# Patient Record
Sex: Female | Born: 1965
Health system: Southern US, Community
[De-identification: ages and names within clinical notes are randomized; demographics above are authoritative.]

## PROBLEM LIST (undated history)

## (undated) DIAGNOSIS — T7840XA Allergy, unspecified, initial encounter: Secondary | ICD-10-CM

## (undated) DIAGNOSIS — R112 Nausea with vomiting, unspecified: Secondary | ICD-10-CM

## (undated) DIAGNOSIS — I10 Essential (primary) hypertension: Secondary | ICD-10-CM

## (undated) DIAGNOSIS — G43909 Migraine, unspecified, not intractable, without status migrainosus: Secondary | ICD-10-CM

## (undated) DIAGNOSIS — Z973 Presence of spectacles and contact lenses: Secondary | ICD-10-CM

## (undated) DIAGNOSIS — E213 Hyperparathyroidism, unspecified: Secondary | ICD-10-CM

## (undated) DIAGNOSIS — F419 Anxiety disorder, unspecified: Secondary | ICD-10-CM

## (undated) DIAGNOSIS — Z87442 Personal history of urinary calculi: Secondary | ICD-10-CM

## (undated) DIAGNOSIS — T4145XA Adverse effect of unspecified anesthetic, initial encounter: Secondary | ICD-10-CM

## (undated) DIAGNOSIS — F32A Depression, unspecified: Secondary | ICD-10-CM

## (undated) DIAGNOSIS — T8859XA Other complications of anesthesia, initial encounter: Secondary | ICD-10-CM

## (undated) DIAGNOSIS — Z9889 Other specified postprocedural states: Secondary | ICD-10-CM

## (undated) HISTORY — DX: Depression, unspecified: F32.A

## (undated) HISTORY — PX: BREAST SURGERY: SHX581

## (undated) HISTORY — PX: OTHER SURGICAL HISTORY: SHX169

## (undated) HISTORY — DX: Allergy, unspecified, initial encounter: T78.40XA

## (undated) HISTORY — DX: Migraine, unspecified, not intractable, without status migrainosus: G43.909

## (undated) HISTORY — PX: NASAL SINUS SURGERY: SHX719

## (undated) HISTORY — DX: Anxiety disorder, unspecified: F41.9

## (undated) HISTORY — DX: Hyperparathyroidism, unspecified: E21.3

## (undated) HISTORY — DX: Essential (primary) hypertension: I10

## (undated) HISTORY — DX: Personal history of urinary calculi: Z87.442

## (undated) HISTORY — PX: PARATHYROIDECTOMY: SHX19

---

## 1999-11-07 ENCOUNTER — Encounter: Payer: Self-pay | Admitting: Emergency Medicine

## 1999-11-07 ENCOUNTER — Encounter: Admission: RE | Admit: 1999-11-07 | Discharge: 1999-11-07 | Payer: Self-pay | Admitting: Emergency Medicine

## 2000-10-01 ENCOUNTER — Encounter: Admission: RE | Admit: 2000-10-01 | Discharge: 2000-10-01 | Payer: Self-pay | Admitting: Emergency Medicine

## 2000-10-01 ENCOUNTER — Encounter: Payer: Self-pay | Admitting: Emergency Medicine

## 2001-03-14 ENCOUNTER — Emergency Department (HOSPITAL_COMMUNITY): Admission: EM | Admit: 2001-03-14 | Discharge: 2001-03-15 | Payer: Self-pay

## 2001-12-05 ENCOUNTER — Encounter: Payer: Self-pay | Admitting: Emergency Medicine

## 2001-12-05 ENCOUNTER — Encounter: Admission: RE | Admit: 2001-12-05 | Discharge: 2001-12-05 | Payer: Self-pay | Admitting: Emergency Medicine

## 2002-01-16 ENCOUNTER — Encounter: Payer: Self-pay | Admitting: Emergency Medicine

## 2002-01-16 ENCOUNTER — Encounter: Admission: RE | Admit: 2002-01-16 | Discharge: 2002-01-16 | Payer: Self-pay | Admitting: Emergency Medicine

## 2002-11-16 ENCOUNTER — Emergency Department (HOSPITAL_COMMUNITY): Admission: EM | Admit: 2002-11-16 | Discharge: 2002-11-16 | Payer: Self-pay | Admitting: *Deleted

## 2004-05-02 ENCOUNTER — Encounter: Admission: RE | Admit: 2004-05-02 | Discharge: 2004-05-02 | Payer: Self-pay | Admitting: Emergency Medicine

## 2006-04-21 ENCOUNTER — Emergency Department (HOSPITAL_COMMUNITY): Admission: AD | Admit: 2006-04-21 | Discharge: 2006-04-21 | Payer: Self-pay | Admitting: Family Medicine

## 2006-07-09 ENCOUNTER — Encounter: Admission: RE | Admit: 2006-07-09 | Discharge: 2006-07-09 | Payer: Self-pay | Admitting: Emergency Medicine

## 2007-11-23 ENCOUNTER — Emergency Department (HOSPITAL_COMMUNITY): Admission: EM | Admit: 2007-11-23 | Discharge: 2007-11-23 | Payer: Self-pay | Admitting: Family Medicine

## 2008-12-15 ENCOUNTER — Emergency Department (HOSPITAL_COMMUNITY): Admission: EM | Admit: 2008-12-15 | Discharge: 2008-12-16 | Payer: Self-pay | Admitting: Emergency Medicine

## 2009-03-27 ENCOUNTER — Emergency Department (HOSPITAL_COMMUNITY): Admission: EM | Admit: 2009-03-27 | Discharge: 2009-03-27 | Payer: Self-pay | Admitting: Emergency Medicine

## 2009-04-26 ENCOUNTER — Ambulatory Visit (HOSPITAL_BASED_OUTPATIENT_CLINIC_OR_DEPARTMENT_OTHER): Admission: RE | Admit: 2009-04-26 | Discharge: 2009-04-26 | Payer: Self-pay | Admitting: Urology

## 2009-09-16 ENCOUNTER — Other Ambulatory Visit: Admission: RE | Admit: 2009-09-16 | Discharge: 2009-09-16 | Payer: Self-pay | Admitting: *Deleted

## 2010-07-18 LAB — URINE MICROSCOPIC-ADD ON

## 2010-07-18 LAB — URINALYSIS, ROUTINE W REFLEX MICROSCOPIC
Bilirubin Urine: NEGATIVE
Specific Gravity, Urine: 1.026 (ref 1.005–1.030)
pH: 6 (ref 5.0–8.0)

## 2010-07-18 LAB — POCT I-STAT, CHEM 8
Calcium, Ion: 1.36 mmol/L — ABNORMAL HIGH (ref 1.12–1.32)
Glucose, Bld: 100 mg/dL — ABNORMAL HIGH (ref 70–99)
HCT: 42 % (ref 36.0–46.0)
Hemoglobin: 14.3 g/dL (ref 12.0–15.0)
TCO2: 24 mmol/L (ref 0–100)

## 2010-07-21 LAB — URINALYSIS, ROUTINE W REFLEX MICROSCOPIC
Ketones, ur: NEGATIVE mg/dL
Leukocytes, UA: NEGATIVE
Nitrite: NEGATIVE
Protein, ur: NEGATIVE mg/dL
Urobilinogen, UA: 0.2 mg/dL (ref 0.0–1.0)
pH: 7.5 (ref 5.0–8.0)

## 2010-07-21 LAB — POCT I-STAT, CHEM 8
Calcium, Ion: 1.39 mmol/L — ABNORMAL HIGH (ref 1.12–1.32)
Chloride: 106 mEq/L (ref 96–112)
HCT: 41 % (ref 36.0–46.0)
TCO2: 25 mmol/L (ref 0–100)

## 2010-07-21 LAB — CBC
HCT: 41.5 % (ref 36.0–46.0)
Platelets: 228 10*3/uL (ref 150–400)
RDW: 12.6 % (ref 11.5–15.5)

## 2010-07-21 LAB — POCT PREGNANCY, URINE: Preg Test, Ur: NEGATIVE

## 2010-07-21 LAB — DIFFERENTIAL
Basophils Absolute: 0.1 10*3/uL (ref 0.0–0.1)
Eosinophils Relative: 2 % (ref 0–5)
Lymphocytes Relative: 21 % (ref 12–46)
Neutro Abs: 5 10*3/uL (ref 1.7–7.7)

## 2012-08-13 ENCOUNTER — Encounter: Payer: Self-pay | Admitting: Family Medicine

## 2012-08-13 DIAGNOSIS — G43909 Migraine, unspecified, not intractable, without status migrainosus: Secondary | ICD-10-CM | POA: Insufficient documentation

## 2012-08-13 DIAGNOSIS — Z87442 Personal history of urinary calculi: Secondary | ICD-10-CM | POA: Insufficient documentation

## 2012-08-13 DIAGNOSIS — I10 Essential (primary) hypertension: Secondary | ICD-10-CM | POA: Insufficient documentation

## 2012-08-13 DIAGNOSIS — T7840XA Allergy, unspecified, initial encounter: Secondary | ICD-10-CM | POA: Insufficient documentation

## 2012-08-14 ENCOUNTER — Ambulatory Visit (INDEPENDENT_AMBULATORY_CARE_PROVIDER_SITE_OTHER): Payer: 59 | Admitting: Physician Assistant

## 2012-08-14 ENCOUNTER — Encounter: Payer: Self-pay | Admitting: Physician Assistant

## 2012-08-14 VITALS — BP 138/94 | HR 64 | Temp 98.0°F | Resp 18 | Ht 68.5 in | Wt 163.0 lb

## 2012-08-14 DIAGNOSIS — G43909 Migraine, unspecified, not intractable, without status migrainosus: Secondary | ICD-10-CM

## 2012-08-14 DIAGNOSIS — M549 Dorsalgia, unspecified: Secondary | ICD-10-CM

## 2012-08-14 MED ORDER — METAXALONE 800 MG PO TABS: 800.0000 mg | ORAL_TABLET | Freq: Four times a day (QID) | ORAL | Status: DC

## 2012-08-14 MED ORDER — BUTALBITAL-APAP-CAFFEINE 50-325-40 MG PO TABS: 1.0000 | ORAL_TABLET | Freq: Two times a day (BID) | ORAL | Status: DC | PRN

## 2012-08-14 NOTE — Progress Notes (Signed)
   Patient ID: Jennifer Castaneda MRN: [id number], DOB: 1965-12-11, 47 y.o. Date of Encounter: 08/14/2012, 12:19 PM    Chief Complaint:  Chief Complaint  Patient presents with  . Back Pain    mid-low back pain and spasms x 2 weeks     HPI: 47 y.o. year old female reports that she has no prior h/o back pain. 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 Comes home in morning-feels ok. Goes to sleep-after several hours, is woken with pain in mid back bilaterally. After she wakes up, and moves around, feels no back pain.  Did no unusual activit, lifting, etc at time of onset. NO over head or upper extremity lifting etc.  Used to go to gym and work out but has not in past few months b/c of work schedule. Doing no push ups etc.   Also, reports that "Dr. Tanya Nones gave me oxycodone to use for migraines but said if that didn't work then would use fioricet." Says oxy makes her "out of it all day", "Just doesn't work good for her.' Wants to use fioricet.      Home Meds: Current Outpatient Prescriptions on File Prior to Visit  Medication Sig Dispense Refill  . hydrochlorothiazide (HYDRODIURIL) 25 MG tablet Take 25 mg by mouth daily.      . Multiple Vitamin (MULTIVITAMIN) tablet Take 1 tablet by mouth daily.      . fexofenadine (ALLEGRA) 180 MG tablet Take 180 mg by mouth daily.       No current facility-administered medications on file prior to visit.    Allergies:  Allergies  Allergen Reactions  . Asa (Aspirin) Hives  . Penicillins Hives      Review of Systems: No pain, numbness, tingling down arms or legs. Other ros negative.   Physical Exam: Blood pressure 138/94, pulse 64, temperature 98 F (36.7 C), temperature source Oral, resp. rate 18, height 5' 8.5" (1.74 m), weight 163 lb (73.936 kg), last menstrual period 04/16/2012., Body mass index is 24.42 kg/(m^2). General: Well developed, well nourished, in no acute distress. Neck: Supple. No thyromegaly. Full ROM. No  lymphadenopathy. Lungs: Clear bilaterally to auscultation without wheezes, rales, or rhonchi. Breathing is unlabored. Heart: Regular rhythm. No murmurs, rubs, or gallops. Msk:  Strength and tone normal for age. Back: She is tender with palpation of medial-inferior edges of scapula bilaterally. She is tender with palpation bilaterally from there down to about T8 level. Extremities/Skin: Warm and dry. No clubbing or cyanosis. No edema. No rashes or suspicious lesions. Neuro: Alert and oriented X 3. Moves all extremities spontaneously. Gait is normal. CNII-XII grossly in tact. Psych:  Responds to questions appropriately with a normal affect.     ASSESSMENT AND PLAN:  47 y.o. year old female with  1. Back pain Heat, stretch, otc nsaids with food. And skelaxin-cautioned regarding drowsiness.  - metaxalone (SKELAXIN) 800 MG tablet; Take 1 tablet (800 mg total) by mouth 4 (four) times daily.  Dispense: 60 tablet; Refill: 0  2. Migraine, unspecified, without mention of intractable migraine without mention of status migrainosus - butalbital-acetaminophen-caffeine (FIORICET, ESGIC) 50-325-40 MG per tablet; Take 1 tablet by mouth 2 (two) times daily as needed for headache.  Dispense: 14 tablet; Refill: 2   Signed, [address] [place], [place], [facility] 08/14/2012 12:19 PM

## 2012-09-03 ENCOUNTER — Telehealth: Payer: Self-pay | Admitting: Physician Assistant

## 2012-09-03 MED ORDER — CYCLOBENZAPRINE HCL 10 MG PO TABS: 10.0000 mg | ORAL_TABLET | Freq: Three times a day (TID) | ORAL | Status: DC | PRN

## 2012-09-03 NOTE — Telephone Encounter (Signed)
Try: Flexeril 10 mg one po QID prn # 60 / 0 Sen in Rx please

## 2012-09-03 NOTE — Telephone Encounter (Signed)
Med called to pharmacy.  Pt aware.

## 2012-09-09 ENCOUNTER — Ambulatory Visit (INDEPENDENT_AMBULATORY_CARE_PROVIDER_SITE_OTHER): Payer: 59 | Admitting: Family Medicine

## 2012-09-09 ENCOUNTER — Encounter: Payer: Self-pay | Admitting: Family Medicine

## 2012-09-09 VITALS — BP 126/74 | HR 80 | Temp 98.6°F | Resp 16 | Wt 160.0 lb

## 2012-09-09 DIAGNOSIS — M545 Low back pain: Secondary | ICD-10-CM

## 2012-09-09 MED ORDER — PREDNISONE 20 MG PO TABS: ORAL_TABLET | ORAL | Status: DC

## 2012-09-09 MED ORDER — DIAZEPAM 10 MG PO TABS: 10.0000 mg | ORAL_TABLET | Freq: Two times a day (BID) | ORAL | Status: DC | PRN

## 2012-09-09 NOTE — Progress Notes (Signed)
Subjective:    Patient ID: Jennifer Castaneda, female    DOB: 1965-12-10, 47 y.o.   MRN: [id number]  HPI  Patient was seen May 1 and diagnosed with mid back pain likely muscle strain. She was given muscle relaxers and tincture of time. She states that her back pain is no better. It is located on the left paraspinal muscles in the thoracic spine just medial to her scapula and slightly inferior to the scapula. It is better throughout the day she is up moving around. He gets worse at night when she lies down. She denies any numbness or tingling in the legs patient denies any weakness in the legs. She denies any other neuropathic symptoms. The pain feels like a tightness or a spasm. She got sick on the Skelaxin. He does respond slightly to Flexeril Past Medical History  Diagnosis Date  . Hypertension   . Migraine headache   . History of nephrolithiasis   . Allergy    Current Outpatient Prescriptions on File Prior to Visit  Medication Sig Dispense Refill  . butalbital-acetaminophen-caffeine (FIORICET, ESGIC) 50-325-40 MG per tablet Take 1 tablet by mouth 2 (two) times daily as needed for headache.  14 tablet  2  . cyclobenzaprine (FLEXERIL) 10 MG tablet Take 1 tablet (10 mg total) by mouth 3 (three) times daily as needed for muscle spasms.  60 tablet  0  . fexofenadine (ALLEGRA) 180 MG tablet Take 180 mg by mouth daily.      . hydrochlorothiazide (HYDRODIURIL) 25 MG tablet Take 25 mg by mouth daily.      . Multiple Vitamin (MULTIVITAMIN) tablet Take 1 tablet by mouth daily.       No current facility-administered medications on file prior to visit.   Allergies  Allergen Reactions  . Asa (Aspirin) Hives  . Penicillins Hives  . Skelaxin (Metaxalone) Nausea And Vomiting   History   Social History  . Marital Status: Married    Spouse Name: N/A    Number of Children: N/A  . Years of Education: N/A   Occupational History  . Not on file.   Social History Main Topics  . Smoking  status: Former Smoker    Quit date: 08/14/2004  . Smokeless tobacco: Never Used  . Alcohol Use: No  . Drug Use: No  . Sexually Active: Not on file   Other Topics Concern  . Not on file   Social History Narrative  . No narrative on file     Review of Systems  All other systems reviewed and are negative.       Objective:   Physical Exam  Vitals reviewed. Cardiovascular: Normal rate, regular rhythm and normal heart sounds.   Pulmonary/Chest: Effort normal and breath sounds normal. No respiratory distress. She has no wheezes. She has no rales.  Musculoskeletal:       Thoracic back: She exhibits tenderness and spasm. She exhibits normal range of motion and no bony tenderness.          Assessment & Plan:  1. Low back pain Is likely muscle spasm. Prescribed prednisone dose pack. Also give patient Valium 10 mg by mouth every 12 hours when necessary muscle spasm. Obtain an x-ray of the thoracic spine rule out skeletal pathology. If the x-ray is negative and the medications and time did not help, I would recommend physical therapy. - DG Thoracic Spine W/Swimmers; Future - predniSONE (DELTASONE) 20 MG tablet; 3 tabs poqday 1-2, 2 tabs poqday 3-4, 1 tabs poqday  Dispense:  12 tablet; Refill: 0 - diazepam (VALIUM) 10 MG tablet; Take 1 tablet (10 mg total) by mouth every 12 (twelve) hours as needed for anxiety.  Dispense: 30 tablet; Refill: 1

## 2012-09-15 ENCOUNTER — Ambulatory Visit
Admission: RE | Admit: 2012-09-15 | Discharge: 2012-09-15 | Disposition: A | Discharge status: Home / Self Care | Payer: 59 | Source: Ambulatory Visit | Attending: Family Medicine | Admitting: Family Medicine

## 2012-09-15 DIAGNOSIS — M545 Low back pain: Secondary | ICD-10-CM

## 2012-09-16 ENCOUNTER — Other Ambulatory Visit: Payer: Self-pay | Admitting: Family Medicine

## 2012-09-16 DIAGNOSIS — M549 Dorsalgia, unspecified: Secondary | ICD-10-CM

## 2012-09-30 ENCOUNTER — Encounter: Payer: Self-pay | Admitting: Family Medicine

## 2012-09-30 ENCOUNTER — Ambulatory Visit (INDEPENDENT_AMBULATORY_CARE_PROVIDER_SITE_OTHER): Payer: 59 | Admitting: Family Medicine

## 2012-09-30 VITALS — BP 110/78 | HR 76 | Temp 98.6°F | Resp 16 | Wt 160.0 lb

## 2012-09-30 DIAGNOSIS — L0390 Cellulitis, unspecified: Secondary | ICD-10-CM

## 2012-09-30 DIAGNOSIS — G43909 Migraine, unspecified, not intractable, without status migrainosus: Secondary | ICD-10-CM

## 2012-09-30 MED ORDER — BUTALBITAL-APAP-CAFFEINE 50-325-40 MG PO TABS: 1.0000 | ORAL_TABLET | Freq: Two times a day (BID) | ORAL | Status: DC | PRN

## 2012-09-30 MED ORDER — SULFAMETHOXAZOLE-TRIMETHOPRIM 800-160 MG PO TABS: 1.0000 | ORAL_TABLET | Freq: Two times a day (BID) | ORAL | Status: DC

## 2012-09-30 NOTE — Progress Notes (Signed)
  Subjective:    Patient ID: Jennifer Castaneda, female    DOB: 1965-10-11, 47 y.o.   MRN: [id number]  HPI Patient has had a tender abscess under her right axilla for 3 days. She has a history of MRSA in the past. The did bump is erythematous tender and fluctuant. It has come to a head in the center. She been trying warm compresses. Past Medical History  Diagnosis Date  . Hypertension   . Migraine headache   . History of nephrolithiasis   . Allergy    Current Outpatient Prescriptions on File Prior to Visit  Medication Sig Dispense Refill  . cyclobenzaprine (FLEXERIL) 10 MG tablet Take 1 tablet (10 mg total) by mouth 3 (three) times daily as needed for muscle spasms.  60 tablet  0  . diazepam (VALIUM) 10 MG tablet Take 1 tablet (10 mg total) by mouth every 12 (twelve) hours as needed for anxiety.  30 tablet  1  . fexofenadine (ALLEGRA) 180 MG tablet Take 180 mg by mouth daily.      . hydrochlorothiazide (HYDRODIURIL) 25 MG tablet Take 25 mg by mouth daily.      . Multiple Vitamin (MULTIVITAMIN) tablet Take 1 tablet by mouth daily.      . predniSONE (DELTASONE) 20 MG tablet 3 tabs poqday 1-2, 2 tabs poqday 3-4, 1 tabs poqday  12 tablet  0   No current facility-administered medications on file prior to visit.   Allergies  Allergen Reactions  . Asa (Aspirin) Hives  . Penicillins Hives  . Skelaxin (Metaxalone) Nausea And Vomiting   History   Social History  . Marital Status: Married    Spouse Name: N/A    Number of Children: N/A  . Years of Education: N/A   Occupational History  . Not on file.   Social History Main Topics  . Smoking status: Former Smoker    Quit date: 08/14/2004  . Smokeless tobacco: Never Used  . Alcohol Use: No  . Drug Use: No  . Sexually Active: Not on file   Other Topics Concern  . Not on file   Social History Narrative  . No narrative on file      Review of Systems  All other systems reviewed and are negative.       Objective:   Physical Exam  Vitals reviewed. Cardiovascular: Normal rate and regular rhythm.   Pulmonary/Chest: Effort normal and breath sounds normal.  Skin: There is erythema.   1 cm superficial erythematous boil in the right axilla that has come to a pustule in the center.        Assessment & Plan:  1. Cellulitis and abscess I feel we may avoid I+D.  Begin warm compresses TID and start Bactrim.  I+D if worsening or if no better in 2-3 days. - sulfamethoxazole-trimethoprim (BACTRIM DS,SEPTRA DS) 800-160 MG per tablet; Take 1 tablet by mouth 2 (two) times daily.  Dispense: 20 tablet; Refill: 0  2. Migraine, unspecified, without mention of intractable migraine without mention of status migrainosus Patient lost previous prescription and I refilled again. - butalbital-acetaminophen-caffeine (FIORICET, ESGIC) 50-325-40 MG per tablet; Take 1 tablet by mouth 2 (two) times daily as needed for headache.  Dispense: 14 tablet; Refill: 2

## 2012-10-07 ENCOUNTER — Ambulatory Visit: Payer: 59 | Attending: Family Medicine

## 2012-10-07 DIAGNOSIS — M545 Low back pain, unspecified: Secondary | ICD-10-CM | POA: Insufficient documentation

## 2012-10-07 DIAGNOSIS — IMO0001 Reserved for inherently not codable concepts without codable children: Secondary | ICD-10-CM | POA: Insufficient documentation

## 2012-10-07 DIAGNOSIS — R5381 Other malaise: Secondary | ICD-10-CM | POA: Insufficient documentation

## 2012-10-07 DIAGNOSIS — M6281 Muscle weakness (generalized): Secondary | ICD-10-CM | POA: Insufficient documentation

## 2012-10-08 ENCOUNTER — Ambulatory Visit: Payer: 59

## 2012-10-13 ENCOUNTER — Ambulatory Visit: Payer: 59 | Admitting: Physical Therapy

## 2012-10-14 ENCOUNTER — Ambulatory Visit: Payer: 59 | Attending: Family Medicine

## 2012-10-14 DIAGNOSIS — IMO0001 Reserved for inherently not codable concepts without codable children: Secondary | ICD-10-CM | POA: Insufficient documentation

## 2012-10-14 DIAGNOSIS — R5381 Other malaise: Secondary | ICD-10-CM | POA: Insufficient documentation

## 2012-10-14 DIAGNOSIS — M6281 Muscle weakness (generalized): Secondary | ICD-10-CM | POA: Insufficient documentation

## 2013-01-26 ENCOUNTER — Telehealth: Payer: Self-pay | Admitting: Family Medicine

## 2013-01-26 NOTE — Telephone Encounter (Addendum)
Needs her HCTZ and flexeril refilled   [facility]  [address]

## 2013-01-27 MED ORDER — HYDROCHLOROTHIAZIDE 25 MG PO TABS: 25.0000 mg | ORAL_TABLET | Freq: Every day | ORAL | Status: DC

## 2013-01-27 MED ORDER — CYCLOBENZAPRINE HCL 10 MG PO TABS: 10.0000 mg | ORAL_TABLET | Freq: Three times a day (TID) | ORAL | Status: DC | PRN

## 2013-01-27 NOTE — Telephone Encounter (Signed)
?   OK to Refill  

## 2013-01-27 NOTE — Telephone Encounter (Signed)
ok 

## 2013-01-27 NOTE — Telephone Encounter (Signed)
Rx Refilled  

## 2013-09-17 ENCOUNTER — Other Ambulatory Visit: Payer: Self-pay | Admitting: *Deleted

## 2013-09-17 MED ORDER — HYDROCHLOROTHIAZIDE 25 MG PO TABS: 25.0000 mg | ORAL_TABLET | Freq: Every day | ORAL | Status: DC

## 2013-09-17 NOTE — Telephone Encounter (Signed)
Medication filled x1 with no refills.   Requires office visit before any further refills can be given.  

## 2013-11-30 ENCOUNTER — Other Ambulatory Visit: Payer: Self-pay | Admitting: Family Medicine

## 2013-12-04 ENCOUNTER — Encounter: Payer: Self-pay | Admitting: Family Medicine

## 2013-12-04 ENCOUNTER — Ambulatory Visit (INDEPENDENT_AMBULATORY_CARE_PROVIDER_SITE_OTHER): Payer: 59 | Admitting: Family Medicine

## 2013-12-04 VITALS — BP 100/62 | HR 72 | Temp 98.3°F | Resp 16 | Ht 69.0 in | Wt 158.0 lb

## 2013-12-04 DIAGNOSIS — Z79890 Hormone replacement therapy (postmenopausal): Secondary | ICD-10-CM

## 2013-12-04 DIAGNOSIS — M7989 Other specified soft tissue disorders: Secondary | ICD-10-CM

## 2013-12-04 DIAGNOSIS — G43909 Migraine, unspecified, not intractable, without status migrainosus: Secondary | ICD-10-CM

## 2013-12-04 MED ORDER — CONJ ESTROG-MEDROXYPROGEST ACE 0.3-1.5 MG PO TABS: 1.0000 | ORAL_TABLET | Freq: Every day | ORAL | Status: DC

## 2013-12-04 MED ORDER — HYDROCHLOROTHIAZIDE 25 MG PO TABS: 25.0000 mg | ORAL_TABLET | Freq: Every day | ORAL | Status: DC

## 2013-12-04 MED ORDER — BUTALBITAL-APAP-CAFFEINE 50-325-40 MG PO TABS: 1.0000 | ORAL_TABLET | Freq: Two times a day (BID) | ORAL | Status: DC | PRN

## 2013-12-04 NOTE — Progress Notes (Signed)
Subjective:    Patient ID: Jennifer Castaneda, female    DOB: April 04, 1966, 48 y.o.   MRN: [id number]  HPI Patient complaining of hot flashes, vaginal dryness, poor libido, and they've swings. She chooses to going to menopause. She has not had a period in one year. She is interested in medication to assist with these symptoms. She understands that there is an increased risk of stroke, blood clot, and breast cancer on hormone replacement therapy and she is willing to accept the risk. She also complains of occasional episodic migraines. She takes Fioricet 3-4 times a month. She also complains of swelling in her legs. She had +1 pitting edema in both ankles. This occurred after she discontinued her hydrochlorothiazide. Since resuming the medication the swelling is much better. She denies any chest pain shortness of breath or dyspnea on exertion. She denies any orthopnea or paroxysmal nocturnal dyspnea. Past Medical History  Diagnosis Date  . Hypertension   . Migraine headache   . History of nephrolithiasis   . Allergy    Current Outpatient Prescriptions on File Prior to Visit  Medication Sig Dispense Refill  . fexofenadine (ALLEGRA) 180 MG tablet Take 180 mg by mouth daily.      . hydrochlorothiazide (HYDRODIURIL) 25 MG tablet TAKE 1 TABLET BY MOUTH DAILY  30 tablet  0  . Multiple Vitamin (MULTIVITAMIN) tablet Take 1 tablet by mouth daily.       No current facility-administered medications on file prior to visit.   Allergies  Allergen Reactions  . Asa [Aspirin] Hives  . Penicillins Hives  . Skelaxin [Metaxalone] Nausea And Vomiting   History   Social History  . Marital Status: Married    Spouse Name: N/A    Number of Children: N/A  . Years of Education: N/A   Occupational History  . Not on file.   Social History Main Topics  . Smoking status: Former Smoker    Quit date: 08/14/2004  . Smokeless tobacco: Never Used  . Alcohol Use: No  . Drug Use: No  . Sexual Activity: Not  on file   Other Topics Concern  . Not on file   Social History Narrative  . No narrative on file      Review of Systems  All other systems reviewed and are negative.      Objective:   Physical Exam  Vitals reviewed. Constitutional: She appears well-developed and well-nourished. No distress.  Neck: Neck supple. No JVD present. No thyromegaly present.  Cardiovascular: Normal rate, regular rhythm and normal heart sounds.   No murmur heard. Pulmonary/Chest: Effort normal and breath sounds normal. No respiratory distress. She has no wheezes. She has no rales.  Abdominal: Soft. Bowel sounds are normal. She exhibits no distension. There is no tenderness. There is no rebound and no guarding.  Musculoskeletal: She exhibits no edema.  Lymphadenopathy:    She has no cervical adenopathy.  Skin: She is not diaphoretic.          Assessment & Plan:  Migraine, unspecified, without mention of intractable migraine without mention of status migrainosus - Plan: butalbital-acetaminophen-caffeine (FIORICET, ESGIC) 50-325-40 MG per tablet  Hormone replacement therapy - Plan: estrogen, conjugated,-medroxyprogesterone (PREMPRO) 0.3-1.5 MG per tablet, COMPLETE METABOLIC PANEL WITH GFR, CBC with Differential  Swelling of limb   I refilled the patient's Fioricet. I instructed her to use it sparingly. However this prescription and 2 refills her last 6 months. I saw the patient on Prempro to 0.3/1.5 mg one by mouth daily.  I would try to titrate the patient to the lowest effective dose to help control her symptoms. She is to contact me in one month and let me know how she's doing on this dose. Also check a CMP to evaluate her kidney and liver function. However believe the swelling in her legs is due to a combination of varicose veins/chronic venous insufficiency and stopping her diuretic. I recommended resuming hydrochlorothiazide. Also recommended she wear compression stockings particularly when she is  working 12 hour shifts standing the entire duration.

## 2013-12-05 LAB — COMPLETE METABOLIC PANEL WITH GFR
ALK PHOS: 119 U/L — AB (ref 39–117)
ALT: 15 U/L (ref 0–35)
AST: 15 U/L (ref 0–37)
Albumin: 4.6 g/dL (ref 3.5–5.2)
BILIRUBIN TOTAL: 0.6 mg/dL (ref 0.2–1.2)
BUN: 17 mg/dL (ref 6–23)
CO2: 30 mEq/L (ref 19–32)
Calcium: 10.5 mg/dL (ref 8.4–10.5)
Chloride: 101 mEq/L (ref 96–112)
Creat: 0.72 mg/dL (ref 0.50–1.10)
GFR, Est African American: >89 mL/min
GLUCOSE: 99 mg/dL (ref 70–99)
Potassium: 3.9 mEq/L (ref 3.5–5.3)
SODIUM: 140 meq/L (ref 135–145)
TOTAL PROTEIN: 7 g/dL (ref 6.0–8.3)

## 2013-12-05 LAB — CBC WITH DIFFERENTIAL/PLATELET
BASOS PCT: 1 % (ref 0–1)
Basophils Absolute: 0.1 10*3/uL (ref 0.0–0.1)
Eosinophils Absolute: 0.3 10*3/uL (ref 0.0–0.7)
Eosinophils Relative: 6 % — ABNORMAL HIGH (ref 0–5)
HEMATOCRIT: 39.4 % (ref 36.0–46.0)
HEMOGLOBIN: 13.2 g/dL (ref 12.0–15.0)
LYMPHS ABS: 1.9 10*3/uL (ref 0.7–4.0)
Lymphocytes Relative: 36 % (ref 12–46)
MCH: 29.3 pg (ref 26.0–34.0)
MCHC: 33.5 g/dL (ref 30.0–36.0)
MCV: 87.6 fL (ref 78.0–100.0)
MONO ABS: 0.4 10*3/uL (ref 0.1–1.0)
MONOS PCT: 7 % (ref 3–12)
NEUTROS ABS: 2.7 10*3/uL (ref 1.7–7.7)
Neutrophils Relative %: 50 % (ref 43–77)
Platelets: 274 10*3/uL (ref 150–400)
RBC: 4.5 MIL/uL (ref 3.87–5.11)
RDW: 14.1 % (ref 11.5–15.5)
WBC: 5.4 10*3/uL (ref 4.0–10.5)

## 2013-12-07 ENCOUNTER — Encounter: Payer: Self-pay | Admitting: *Deleted

## 2014-08-22 ENCOUNTER — Emergency Department (HOSPITAL_COMMUNITY)
Admission: EM | Admit: 2014-08-22 | Discharge: 2014-08-22 | Disposition: A | Discharge status: Home / Self Care | Payer: 59 | Source: Home / Self Care | Attending: Family Medicine | Admitting: Family Medicine

## 2014-08-22 DIAGNOSIS — J111 Influenza due to unidentified influenza virus with other respiratory manifestations: Secondary | ICD-10-CM | POA: Diagnosis not present

## 2014-08-22 DIAGNOSIS — R69 Illness, unspecified: Principal | ICD-10-CM

## 2014-08-22 LAB — POCT RAPID STREP A: Streptococcus, Group A Screen (Direct): NEGATIVE

## 2014-08-22 NOTE — Discharge Instructions (Signed)
Treat fever as needed, drink plenty of fluids, rest, no work on mon.

## 2014-08-22 NOTE — ED Provider Notes (Signed)
CSN: [id number]     Arrival date & time 08/22/14  1409 History   First MD Initiated Contact with Patient 08/22/14 1445     Chief Complaint  Patient presents with  . Sore Throat   (Consider location/radiation/quality/duration/timing/severity/associated sxs/prior Treatment) Patient is a 49 y.o. female presenting with fever. The history is provided by the patient.  Fever Temp source:  Oral Onset quality:  Sudden Duration:  2 days Progression:  Worsening Chronicity:  New Relieved by:  None tried Worsened by:  Nothing tried Ineffective treatments:  None tried Associated symptoms: chills, congestion, cough, diarrhea, myalgias and sore throat   Associated symptoms: no chest pain, no dysuria, no rash and no vomiting   Risk factors: sick contacts   Risk factors comment:  Pts at work with flu.   Past Medical History  Diagnosis Date  . Hypertension   . Migraine headache   . History of nephrolithiasis   . Allergy    Past Surgical History  Procedure Laterality Date  . Nasal sinus surgery    . Cesarean section    . Breast surgery      cyst   Family History  Problem Relation Age of Onset  . Heart disease Mother   . Hypertension Mother   . Cancer Neg Hx    History  Substance Use Topics  . Smoking status: Former Smoker    Quit date: 08/14/2004  . Smokeless tobacco: Never Used  . Alcohol Use: No   OB History    No data available     Review of Systems  Constitutional: Positive for fever and chills.  HENT: Positive for congestion and sore throat.   Respiratory: Positive for cough. Negative for shortness of breath.   Cardiovascular: Negative for chest pain.  Gastrointestinal: Positive for diarrhea. Negative for vomiting.  Genitourinary: Negative.  Negative for dysuria.  Musculoskeletal: Positive for myalgias.  Skin: Negative for rash.    Allergies  Asa; Penicillins; and Skelaxin  Home Medications   Prior to Admission medications   Medication Sig Start Date End Date  Taking? Authorizing Provider  butalbital-acetaminophen-caffeine (FIORICET, ESGIC) 5305336500 MG per tablet Take 1 tablet by mouth 2 (two) times daily as needed for headache. 12/04/13   Susy Frizzle, MD  estrogen, conjugated,-medroxyprogesterone (PREMPRO) 0.3-1.5 MG per tablet Take 1 tablet by mouth daily. 12/04/13   Susy Frizzle, MD  fexofenadine (ALLEGRA) 180 MG tablet Take 180 mg by mouth daily.    Historical Provider, MD  hydrochlorothiazide (HYDRODIURIL) 25 MG tablet TAKE 1 TABLET BY MOUTH DAILY 11/30/13   Susy Frizzle, MD  hydrochlorothiazide (HYDRODIURIL) 25 MG tablet Take 1 tablet (25 mg total) by mouth daily. 12/04/13   Susy Frizzle, MD  Multiple Vitamin (MULTIVITAMIN) tablet Take 1 tablet by mouth daily.    Historical Provider, MD   BP 144/94 mmHg  Pulse 98  Temp(Src) 99.4 F (37.4 C) (Oral)  Resp 20  SpO2 97%  LMP 04/16/2012 Physical Exam  Constitutional: She is oriented to person, place, and time. She appears well-developed and well-nourished. No distress.  HENT:  Head: Normocephalic.  Right Ear: External ear normal.  Left Ear: External ear normal.  Mouth/Throat: Oropharynx is clear and moist.  Eyes: Conjunctivae are normal. Pupils are equal, round, and reactive to light.  Neck: Normal range of motion. Neck supple.  Cardiovascular: Normal rate, normal heart sounds and intact distal pulses.   Pulmonary/Chest: Effort normal and breath sounds normal.  Lymphadenopathy:    She has cervical adenopathy.  Neurological: She is alert and oriented to person, place, and time.  Skin: Skin is warm and dry.  Nursing note and vitals reviewed.   ED Course  Procedures (including critical care time) Labs Review Labs Reviewed  POCT RAPID STREP A ([facility] ONLY)   Strep neg. Imaging Review No results found.   MDM   1. Influenza-like illness        Billy Fischer, MD 08/22/14 1536

## 2014-08-22 NOTE — ED Notes (Signed)
Sore throat, cough, low grade temp x 3 days

## 2014-08-23 ENCOUNTER — Ambulatory Visit (INDEPENDENT_AMBULATORY_CARE_PROVIDER_SITE_OTHER): Payer: 59 | Admitting: Physician Assistant

## 2014-08-23 ENCOUNTER — Encounter: Payer: Self-pay | Admitting: Physician Assistant

## 2014-08-23 ENCOUNTER — Telehealth: Payer: Self-pay | Admitting: Physician Assistant

## 2014-08-23 ENCOUNTER — Encounter: Payer: Self-pay | Admitting: Family Medicine

## 2014-08-23 VITALS — BP 132/84 | HR 84 | Temp 98.4°F | Resp 20 | Wt 162.0 lb

## 2014-08-23 DIAGNOSIS — R509 Fever, unspecified: Secondary | ICD-10-CM | POA: Diagnosis not present

## 2014-08-23 DIAGNOSIS — J988 Other specified respiratory disorders: Secondary | ICD-10-CM | POA: Diagnosis not present

## 2014-08-23 DIAGNOSIS — B9689 Other specified bacterial agents as the cause of diseases classified elsewhere: Principal | ICD-10-CM

## 2014-08-23 LAB — INFLUENZA A AND B
INFLUENZA A AG: NEGATIVE
INFLUENZA B AG: NEGATIVE

## 2014-08-23 MED ORDER — AZITHROMYCIN 250 MG PO TABS: ORAL_TABLET | ORAL | Status: DC

## 2014-08-23 NOTE — Telephone Encounter (Signed)
Patient called in states she went to [facility] yesterday was dx with the flu. She has requested FMLA from her employer "[employer]". She states that they took her out through tonight but without FMLA paperwork it is going to give her 3 additional points which patient will be over the allowable amount. I advised her that she would need to contact [employer] and request the paperwork for FMLA. She wants to know if she needs to be seen here today. I advised patient I would take message and have provider look at the note from yesterday but felt that if she was dx with flu yesterday she didn't need to come here.  Please advise

## 2014-08-23 NOTE — Progress Notes (Signed)
Patient ID: Jennifer Castaneda MRN: [id number], DOB: 1965-05-11, 49 y.o. Date of Encounter: 08/23/2014, 1:50 PM    Chief Complaint:  Chief Complaint  Patient presents with  . sick x 5 days    cough, chest burns, diarrhea, fever (101.5), seen urgent care yesterday they gave her nothing     HPI: 49 y.o. year old female states that she works as a Marine scientist at labor and delivery.  Says that symptoms started with a tickle in her throat then stuffiness in her head and nose and now is into her chest. Says now she is coughing up thick dark phlegm. Says she went to an urgent care yesterday but they gave her no medication so she is here for follow-up. Says the symptoms are worsening and moving into her chest and in the past when this is happened at his cause bronchitis.     Home Meds:   Outpatient Prescriptions Prior to Visit  Medication Sig Dispense Refill  . butalbital-acetaminophen-caffeine (FIORICET, ESGIC) 50-325-40 MG per tablet Take 1 tablet by mouth 2 (two) times daily as needed for headache. 14 tablet 2  . fexofenadine (ALLEGRA) 180 MG tablet Take 180 mg by mouth daily.    . hydrochlorothiazide (HYDRODIURIL) 25 MG tablet TAKE 1 TABLET BY MOUTH DAILY 30 tablet 0  . Multiple Vitamin (MULTIVITAMIN) tablet Take 1 tablet by mouth daily.    Marland Kitchen estrogen, conjugated,-medroxyprogesterone (PREMPRO) 0.3-1.5 MG per tablet Take 1 tablet by mouth daily. (Patient not taking: Reported on 08/23/2014) 30 tablet 2  . hydrochlorothiazide (HYDRODIURIL) 25 MG tablet Take 1 tablet (25 mg total) by mouth daily. 90 tablet 3   No facility-administered medications prior to visit.    Allergies:  Allergies  Allergen Reactions  . Asa [Aspirin] Hives  . Penicillins Hives  . Skelaxin [Metaxalone] Nausea And Vomiting      Review of Systems: See HPI for pertinent ROS. All other ROS negative.    Physical Exam: Blood pressure 132/84, pulse 84, temperature 98.4 F (36.9 C), temperature source Oral, resp.  rate 20, weight 162 lb (73.483 kg), last menstrual period 04/16/2012., Body mass index is 23.91 kg/(m^2). General:  WNWD Female. Appears in no acute distress. HEENT: Normocephalic, atraumatic, eyes without discharge, sclera non-icteric, nares are without discharge. Bilateral auditory canals clear, TM's are without perforation, pearly grey and translucent with reflective cone of light bilaterally. Oral cavity moist, posterior pharynx without exudate, erythema, peritonsillar abscess.  Neck: Supple. No thyromegaly. No lymphadenopathy. Lungs: Clear bilaterally to auscultation without wheezes, rales, or rhonchi. Breathing is unlabored. Heart: Regular rhythm. No murmurs, rubs, or gallops. Msk:  Strength and tone normal for age. Extremities/Skin: Warm and dry. Neuro: Alert and oriented X 3. Moves all extremities spontaneously. Gait is normal. CNII-XII grossly in tact. Psych:  Responds to questions appropriately with a normal affect.   Results for orders placed or performed in visit on 08/23/14  Influenza a and b  Result Value Ref Range   Source-INFBD NASAL    Inflenza A Ag NEG Negative   Influenza B Ag NEG Negative     ASSESSMENT AND PLAN:  49 y.o. year old female with  1. Bacterial respiratory infection  Urgent care records were reviewed. Rapid stress test was performed there and was negative. Influenza test today also negative. - azithromycin (ZITHROMAX) 250 MG tablet; Day 1: Take 2 daily.  Days 2-5: Take 1 daily.  Dispense: 6 tablet; Refill: 0 Is to take antibiotic as directed. Recommend expectorant such as Mucinex DM. Follow-up  if symptoms do not resolve within 1 week after completion of antibiotic. Letter given for out of work through 08/28/14 secondary to the way her work schedule is.  2. Fever and chills - Influenza a and b   Signed, [address] [place], [place], [facility] 08/23/2014 1:50 PM

## 2014-08-23 NOTE — Telephone Encounter (Signed)
Spoke with [place] and since patient wasn't dx with flu she would need to be seen. I called patient Dr. Dennard Schaumann didn't have anything for today so I scheduled her with [facility].

## 2014-08-24 ENCOUNTER — Telehealth: Payer: Self-pay | Admitting: Physician Assistant

## 2014-08-24 LAB — CULTURE, GROUP A STREP: Strep A Culture: NEGATIVE

## 2014-08-24 NOTE — Telephone Encounter (Signed)
fmla forms received by fax on 08/24/2014 Routed to sabrina and shannon

## 2014-08-25 NOTE — Telephone Encounter (Signed)
[facility]trc to pt need to know what FMLA papers are for and hrs work and job duties

## 2014-08-26 NOTE — Telephone Encounter (Signed)
FMLA forms completed and given to [place].

## 2014-08-26 NOTE — Telephone Encounter (Signed)
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

## 2014-08-27 NOTE — Telephone Encounter (Signed)
More FMLA ppw was faxed over for this pt. I have routed them to [place]

## 2014-08-30 NOTE — Telephone Encounter (Signed)
ppw placed on my desk and pt aware needs to pay the form fee and I will fax over FMLA  Routed the call to [facility] and pt paid the fee

## 2014-09-03 NOTE — Telephone Encounter (Signed)
Payment has been posted.

## 2014-09-21 ENCOUNTER — Encounter: Payer: Self-pay | Admitting: Family Medicine

## 2014-09-21 ENCOUNTER — Ambulatory Visit
Admission: RE | Admit: 2014-09-21 | Discharge: 2014-09-21 | Disposition: A | Discharge status: Home / Self Care | Payer: 59 | Source: Ambulatory Visit | Attending: Family Medicine | Admitting: Family Medicine

## 2014-09-21 ENCOUNTER — Ambulatory Visit (INDEPENDENT_AMBULATORY_CARE_PROVIDER_SITE_OTHER): Payer: 59 | Admitting: Family Medicine

## 2014-09-21 VITALS — BP 128/74 | HR 68 | Temp 98.7°F | Resp 14 | Ht 69.0 in | Wt 159.0 lb

## 2014-09-21 DIAGNOSIS — R109 Unspecified abdominal pain: Secondary | ICD-10-CM | POA: Diagnosis not present

## 2014-09-21 DIAGNOSIS — N390 Urinary tract infection, site not specified: Secondary | ICD-10-CM

## 2014-09-21 LAB — URINALYSIS, ROUTINE W REFLEX MICROSCOPIC
Glucose, UA: NEGATIVE mg/dL
KETONES UR: NEGATIVE mg/dL
Nitrite: NEGATIVE
Protein, ur: NEGATIVE mg/dL
Specific Gravity, Urine: 1.025 (ref 1.005–1.030)
Urobilinogen, UA: 0.2 mg/dL (ref 0.0–1.0)
pH: 6 (ref 5.0–8.0)

## 2014-09-21 LAB — CBC W/MCH & 3 PART DIFF
HCT: 38.3 % (ref 36.0–46.0)
HEMOGLOBIN: 13.3 g/dL (ref 12.0–15.0)
Lymphocytes Relative: 29 % (ref 12–46)
Lymphs Abs: 1.6 10*3/uL (ref 0.7–4.0)
MCH: 30.5 pg (ref 26.0–34.0)
MCHC: 34.7 g/dL (ref 30.0–36.0)
MCV: 87.8 fL (ref 78.0–100.0)
NEUTROS ABS: 3.4 10*3/uL (ref 1.7–7.7)
Neutrophils Relative %: 63 % (ref 43–77)
PLATELETS: 172 10*3/uL (ref 150–400)
RBC: 4.36 MIL/uL (ref 3.87–5.11)
RDW: 13 % (ref 11.5–15.5)
WBC mixed population: 0.4 10*3/uL (ref 0.1–1.8)
WBC: 5.4 10*3/uL (ref 4.0–10.5)
WBCMIXPER: 8 % (ref 3–18)

## 2014-09-21 LAB — URINALYSIS, MICROSCOPIC ONLY
CASTS: NONE SEEN
Crystals: NONE SEEN

## 2014-09-21 MED ORDER — PHENAZOPYRIDINE HCL 100 MG PO TABS: 100.0000 mg | ORAL_TABLET | Freq: Three times a day (TID) | ORAL | Status: DC | PRN

## 2014-09-21 MED ORDER — CIPROFLOXACIN HCL 500 MG PO TABS: 500.0000 mg | ORAL_TABLET | Freq: Two times a day (BID) | ORAL | Status: DC

## 2014-09-21 MED ORDER — TAMSULOSIN HCL 0.4 MG PO CAPS: 0.4000 mg | ORAL_CAPSULE | Freq: Every day | ORAL | Status: DC

## 2014-09-21 NOTE — Patient Instructions (Addendum)
Take AZO for bladder spasm Stop Bactrim, start Cipro twice a day  Get xray done Your CBC was normal Urine culture sent  F/U pending results

## 2014-09-21 NOTE — Progress Notes (Signed)
Patient ID: Jennifer Castaneda, female   DOB: 1965-08-05, 49 y.o.   MRN: [id number]   Subjective:    Patient ID: Jennifer Castaneda, female    DOB: 24-Jul-1965, 49 y.o.   MRN: [id number]  Patient presents for Kidney Stones  Pt here with left flank pain started Saturday AM along with dysuria, and urinary frequency. She was was seen at [facility], given Bactrim , indomethacin and norco. No xray done. Continues to have a lot of spasm in her bladder which is causing severe pain, as well as soreness in flank. Denies fever, N/V, denies change in stool, no change bowels. Has not passed a stone.     Review Of Systems:  GEN- denies fatigue, fever, weight loss,weakness, recent illness HEENT- denies eye drainage, change in vision, nasal discharge, CVS- denies chest pain, palpitations RESP- denies SOB, cough, wheeze ABD- denies N/V, change in stools, abd pain GU- +dysuria, hematuria, dribbling, incontinence MSK- denies joint pain,+ muscle aches, injury Neuro- denies headache, dizziness, syncope, seizure activity       Objective:    BP 128/74 mmHg  Pulse 68  Temp(Src) 98.7 F (37.1 C) (Oral)  Resp 14  Ht 5\' 9"  (1.753 m)  Wt 159 lb (72.122 kg)  BMI 23.47 kg/m2  LMP 04/16/2012 GEN- NAD, alert and oriented x3 HEENT- PERRL, EOMI, non injected sclera, pink conjunctiva, MMM, oropharynx clear ABD-NABS,soft,TTP Suprapubic region, LLQ, + left CVA tenderness, no rebound, gaurding at flank  EXT- No edema Pulses- Radial  2+  CBC with diff- wnl       Assessment & Plan:      Problem List Items Addressed This Visit    None    Visit Diagnoses    UTI (lower urinary tract infection)    -  Primary    Change to Cipro, no sign of acute pylonephriotis, based on CBC, no fever, Urine no overwhelming pyuria. Concern for possible kidney stone with hematura.  Xray to be done  Pt has flomax at home if stone seen on xray Pyrdium given for bladder spasm    Relevant Medications     sulfamethoxazole-trimethoprim (BACTRIM DS,SEPTRA DS) 800-160 MG per tablet    phenazopyridine (PYRIDIUM) 100 MG tablet    Other Relevant Orders    Urinalysis, Routine w reflex microscopic (not at [facility]) (Completed)    CBC w/[facility] & 3 Part Diff (Completed)    Urine culture    Left flank pain        Relevant Orders    DG Abd 2 Views       Note: This dictation was prepared with Dragon dictation along with smaller phrase technology. Any transcriptional errors that result from this process are unintentional.

## 2014-09-23 LAB — URINE CULTURE
Colony Count: NO GROWTH
Organism ID, Bacteria: NO GROWTH

## 2014-09-24 ENCOUNTER — Telehealth: Payer: Self-pay | Admitting: Physician Assistant

## 2014-09-24 DIAGNOSIS — R1030 Lower abdominal pain, unspecified: Secondary | ICD-10-CM

## 2014-09-24 DIAGNOSIS — N3289 Other specified disorders of bladder: Secondary | ICD-10-CM

## 2014-09-24 NOTE — Telephone Encounter (Signed)
Spoke with patient earlier.   Reports that she is having bladder spasms, but at that time, no pain discussed.   States that pain that was radiating around back has resolved.   MD to be made aware.

## 2014-09-24 NOTE — Telephone Encounter (Signed)
Patient states returning call and wants you to know that she is still having a lot of bladder pain [phone]

## 2014-09-27 NOTE — Telephone Encounter (Signed)
Patient returned call and made aware.   States that she passed another stone on Saturday night, and symptoms have almost completely resolved.   MD to be made aware.

## 2014-09-27 NOTE — Telephone Encounter (Signed)
noted 

## 2014-09-27 NOTE — Telephone Encounter (Signed)
Orders placed.   [place].

## 2014-09-27 NOTE — Telephone Encounter (Signed)
Have pt get CT abdomen pelvis with oral contrast, to see if there is something in the pelvic region or bowels that we are missing   - Dx- Abdominal pain, flank pain,   - okay to send referral to urology-

## 2014-09-28 ENCOUNTER — Telehealth: Payer: Self-pay | Admitting: *Deleted

## 2014-09-28 NOTE — Telephone Encounter (Signed)
Pt is scheduled for appt on 10/01/14 at 10:15am arrival 9:55am at [facility]. Pt is to have nothing to eat 4hrs proir to her exam, also, pt needs to come and pick up the contrast day before her appt and drink on bottle at 8:15am and the 2nd bottle at 9:15am. Left message to return call to pt.

## 2014-09-29 ENCOUNTER — Telehealth: Payer: Self-pay | Admitting: *Deleted

## 2014-09-29 NOTE — Telephone Encounter (Signed)
Pt has appt scheduled at [facility] urology on 10/25/14 at 11am with Dr. Janice Norrie, left message on pt vm to return my call

## 2014-10-01 ENCOUNTER — Other Ambulatory Visit: Payer: 59

## 2014-10-08 NOTE — Telephone Encounter (Signed)
Pt rtn your call  [phone]

## 2014-10-08 NOTE — Telephone Encounter (Signed)
Pt called back and aware of appt 

## 2014-10-08 NOTE — Telephone Encounter (Signed)
lmtrc

## 2014-12-23 ENCOUNTER — Other Ambulatory Visit: Payer: Self-pay | Admitting: Family Medicine

## 2014-12-23 NOTE — Telephone Encounter (Signed)
Refill appropriate and filled per protocol. 

## 2015-03-25 ENCOUNTER — Other Ambulatory Visit (HOSPITAL_COMMUNITY): Payer: Self-pay | Admitting: Sports Medicine

## 2015-03-25 DIAGNOSIS — M25551 Pain in right hip: Secondary | ICD-10-CM

## 2015-04-14 ENCOUNTER — Other Ambulatory Visit: Payer: Self-pay | Admitting: Family Medicine

## 2015-04-14 NOTE — Telephone Encounter (Signed)
Medication called to pharmacy. 

## 2015-04-14 NOTE — Telephone Encounter (Signed)
Approved. # [phone] + 5 refills

## 2015-04-14 NOTE — Telephone Encounter (Signed)
Ok to refill??  Last office visit 10/01/2014.  Last refill 12/04/2013.

## 2015-04-19 ENCOUNTER — Ambulatory Visit (HOSPITAL_COMMUNITY)
Admission: RE | Admit: 2015-04-19 | Discharge: 2015-04-19 | Disposition: A | Discharge status: Home / Self Care | Payer: 59 | Source: Ambulatory Visit | Attending: Sports Medicine | Admitting: Sports Medicine

## 2015-04-19 DIAGNOSIS — M25551 Pain in right hip: Secondary | ICD-10-CM

## 2015-04-19 MED ORDER — IOHEXOL 300 MG/ML  SOLN
50.0000 mL | Freq: Once | INTRAMUSCULAR | Status: AC | PRN
  Administered 2015-04-19: 10 mL

## 2015-04-19 MED ORDER — GADOBENATE DIMEGLUMINE 529 MG/ML IV SOLN
5.0000 mL | Freq: Once | INTRAVENOUS | Status: AC | PRN
  Administered 2015-04-19: 5 mL via INTRAVENOUS

## 2015-04-21 DIAGNOSIS — M25551 Pain in right hip: Secondary | ICD-10-CM | POA: Diagnosis not present

## 2015-04-21 DIAGNOSIS — M7601 Gluteal tendinitis, right hip: Secondary | ICD-10-CM | POA: Diagnosis not present

## 2015-05-23 DIAGNOSIS — M7601 Gluteal tendinitis, right hip: Secondary | ICD-10-CM | POA: Diagnosis not present

## 2015-05-23 DIAGNOSIS — M25551 Pain in right hip: Secondary | ICD-10-CM | POA: Diagnosis not present

## 2015-08-10 ENCOUNTER — Ambulatory Visit (INDEPENDENT_AMBULATORY_CARE_PROVIDER_SITE_OTHER): Payer: 59 | Admitting: Physician Assistant

## 2015-08-10 ENCOUNTER — Encounter: Payer: Self-pay | Admitting: Physician Assistant

## 2015-08-10 VITALS — BP 122/84 | HR 76 | Temp 98.3°F | Resp 18 | Wt 168.0 lb

## 2015-08-10 DIAGNOSIS — J988 Other specified respiratory disorders: Secondary | ICD-10-CM | POA: Diagnosis not present

## 2015-08-10 DIAGNOSIS — B9689 Other specified bacterial agents as the cause of diseases classified elsewhere: Principal | ICD-10-CM

## 2015-08-10 MED ORDER — AZITHROMYCIN 250 MG PO TABS: ORAL_TABLET | ORAL | Status: DC

## 2015-08-10 NOTE — Progress Notes (Signed)
Patient ID: Jennifer Castaneda MRN: [id number], DOB: May 03, 1965, 50 y.o. Date of Encounter: 08/10/2015, 10:44 AM    Chief Complaint:  Chief Complaint  Patient presents with  . sick x 1 week    sinus infection, green drainage     HPI: 50 y.o. year old female presents with above. States that she has been having nasal congestion and thick dark mucus from her nose. She feels pressure in her sinus regions and just started feeling some in her teeth as well. States that during the day she can feel drainage down her throat and when she first wakes up in the morning she has a lot of drainage in her throat. Otherwise does not seem to be in her chest at all. No significant sore throat. No earache. No fevers or chills. Works as Therapist, sports at [employer].     Home Meds:   Outpatient Prescriptions Prior to Visit  Medication Sig Dispense Refill  . butalbital-acetaminophen-caffeine (FIORICET, ESGIC) 50-325-40 MG tablet TAKE 1 TABLET BY MOUTH TWICE DAILY AS NEEDED FOR HEADACHE 14 tablet 5  . fluticasone (FLONASE) 50 MCG/ACT nasal spray 2 sprays each nostril once a day    . hydrochlorothiazide (HYDRODIURIL) 25 MG tablet TAKE 1 TABLET BY MOUTH DAILY. 90 tablet PRN  . Multiple Vitamin (MULTIVITAMIN) tablet Take 1 tablet by mouth daily.    . tamsulosin (FLOMAX) 0.4 MG CAPS capsule Take 1 capsule (0.4 mg total) by mouth daily. (Patient not taking: Reported on 08/10/2015) 30 capsule 3  . ciprofloxacin (CIPRO) 500 MG tablet Take 1 tablet (500 mg total) by mouth 2 (two) times daily. 14 tablet 0  . indomethacin (INDOCIN) 50 MG capsule Reported on 08/10/2015  0  . phenazopyridine (PYRIDIUM) 100 MG tablet Take 1 tablet (100 mg total) by mouth 3 (three) times daily as needed for pain. (Patient not taking: Reported on 08/10/2015) 10 tablet 0  . sulfamethoxazole-trimethoprim (BACTRIM DS,SEPTRA DS) 800-160 MG per tablet   0   No facility-administered medications prior to visit.     Allergies:  Allergies  Allergen Reactions  . Asa [Aspirin] Hives  . Penicillins Hives  . Skelaxin [Metaxalone] Nausea And Vomiting      Review of Systems: See HPI for pertinent ROS. All other ROS negative.    Physical Exam: Blood pressure 122/84, pulse 76, temperature 98.3 F (36.8 C), temperature source Oral, resp. rate 18, weight 168 lb (76.204 kg), last menstrual period 04/16/2012., Body mass index is 24.8 kg/(m^2). General:  WNWD AAF. Appears in no acute distress. HEENT: Normocephalic, atraumatic, eyes without discharge, sclera non-icteric, nares are without discharge. Bilateral auditory canals clear, TM's are without perforation, pearly grey and translucent with reflective cone of light bilaterally. Oral cavity moist, posterior pharynx without exudate, erythema, peritonsillar abscess. No tenderness with percussion to frontal or maxillary sinuses bilaterally. Just says that it feels like pressure inside and congestion but not tender to the touch.  Neck: Supple. No thyromegaly. No lymphadenopathy. Lungs: Clear bilaterally to auscultation without wheezes, rales, or rhonchi. Breathing is unlabored. Heart: Regular rhythm. No murmurs, rubs, or gallops. Msk:  Strength and tone normal for age. Extremities/Skin: Warm and dry. Neuro: Alert and oriented X 3. Moves all extremities spontaneously. Gait is normal. CNII-XII grossly in tact. Psych:  Responds to questions appropriately with a normal affect.     ASSESSMENT AND PLAN:  50 y.o. year old female with  1. Bacterial respiratory infection She has penicillin allergy so cannot use amoxicillin or Augmentin. She  is to take azithromycin as directed. Also use over-the-counter decongestants for symptom relief. Follow-up if symptoms do not resolve within 1 week after completion of antibiotic. - azithromycin (ZITHROMAX) 250 MG tablet; Day 1: Take 2 daily. Days 2-5: Take 1 daily.  Dispense: 6 tablet; Refill: 0   Signed, [address] [place],  [place], [facility] 08/10/2015 10:44 AM

## 2016-01-27 ENCOUNTER — Emergency Department: Payer: 59

## 2016-01-27 ENCOUNTER — Encounter: Payer: Self-pay | Admitting: *Deleted

## 2016-01-27 ENCOUNTER — Emergency Department
Admission: EM | Admit: 2016-01-27 | Discharge: 2016-01-27 | Disposition: A | Discharge status: Home / Self Care | Payer: 59 | Attending: Emergency Medicine | Admitting: Emergency Medicine

## 2016-01-27 DIAGNOSIS — W109XXA Fall (on) (from) unspecified stairs and steps, initial encounter: Secondary | ICD-10-CM | POA: Insufficient documentation

## 2016-01-27 DIAGNOSIS — Y999 Unspecified external cause status: Secondary | ICD-10-CM | POA: Diagnosis not present

## 2016-01-27 DIAGNOSIS — I10 Essential (primary) hypertension: Secondary | ICD-10-CM | POA: Insufficient documentation

## 2016-01-27 DIAGNOSIS — S99912A Unspecified injury of left ankle, initial encounter: Secondary | ICD-10-CM | POA: Diagnosis not present

## 2016-01-27 DIAGNOSIS — Y92009 Unspecified place in unspecified non-institutional (private) residence as the place of occurrence of the external cause: Secondary | ICD-10-CM | POA: Insufficient documentation

## 2016-01-27 DIAGNOSIS — S93492A Sprain of other ligament of left ankle, initial encounter: Secondary | ICD-10-CM

## 2016-01-27 DIAGNOSIS — Y9389 Activity, other specified: Secondary | ICD-10-CM | POA: Diagnosis not present

## 2016-01-27 DIAGNOSIS — Z79899 Other long term (current) drug therapy: Secondary | ICD-10-CM | POA: Diagnosis not present

## 2016-01-27 DIAGNOSIS — S93432A Sprain of tibiofibular ligament of left ankle, initial encounter: Secondary | ICD-10-CM | POA: Diagnosis not present

## 2016-01-27 DIAGNOSIS — Z87891 Personal history of nicotine dependence: Secondary | ICD-10-CM | POA: Insufficient documentation

## 2016-01-27 MED ORDER — TRAMADOL HCL 50 MG PO TABS: 50.0000 mg | ORAL_TABLET | Freq: Four times a day (QID) | ORAL | 0 refills | Status: DC | PRN

## 2016-01-27 MED ORDER — OXYCODONE-ACETAMINOPHEN 5-325 MG PO TABS
1.0000 | ORAL_TABLET | ORAL | Status: DC | PRN
  Administered 2016-01-27: 1 via ORAL

## 2016-01-27 MED ORDER — OXYCODONE-ACETAMINOPHEN 5-325 MG PO TABS
ORAL_TABLET | ORAL | Status: AC
  Filled 2016-01-27: qty 1

## 2016-01-27 MED ORDER — IBUPROFEN 800 MG PO TABS: 800.0000 mg | ORAL_TABLET | Freq: Three times a day (TID) | ORAL | 0 refills | Status: DC | PRN

## 2016-01-27 NOTE — ED Triage Notes (Signed)
Pt fell down 3 steps at home, rolled L ankle, landing on L side of L leg. Pt denies LoC. Pt c/o swelling to L foot and ankle, L knee pain. Pt states L toes have numbness and tingling. L foot warm, dry, PT pulse 2+. Shoe left on foot to support at this time.

## 2016-01-27 NOTE — ED Provider Notes (Signed)
[facility] Provider Note   CSN: [id number] Arrival date & time: 01/27/16  1924     History   Chief Complaint Chief Complaint  Patient presents with  . Ankle Injury    HPI Jennifer Castaneda is a 50 y.o. female presents to the emergency room for evaluation of left ankle pain. Patient injured her left ankle just prior to arrival. Patient states she was going down steps to the garrage, missed the steps and rolled her left ankle. She has left lateral ankle pain. She was given Percocet tablet in the waiting room. Her pain is moderate. She is unable to bear weight. She denies any other injuries to her body. No head neck pain. She is able to move the knee and hip with no discomfort.  HPI  Past Medical History:  Diagnosis Date  . Allergy   . History of nephrolithiasis   . Hypertension   . Migraine headache     Patient Active Problem List   Diagnosis Date Noted  . Hypertension   . Migraine headache   . History of nephrolithiasis   . Allergy     Past Surgical History:  Procedure Laterality Date  . BREAST SURGERY     cyst  . CESAREAN SECTION    . NASAL SINUS SURGERY      OB History    No data available       Home Medications    Prior to Admission medications   Medication Sig Start Date End Date Taking? Authorizing Provider  azithromycin (ZITHROMAX) 250 MG tablet Day 1: Take 2 daily. Days 2-5: Take 1 daily. 08/10/15   Lonie Peak Dixon, PA-C  butalbital-acetaminophen-caffeine (FIORICET, ESGIC) [phone] MG tablet TAKE 1 TABLET BY MOUTH TWICE DAILY AS NEEDED FOR HEADACHE 04/14/15   Orlena Sheldon, PA-C  fluticasone [facility]) 50 MCG/ACT nasal spray 2 sprays each nostril once a day 08/01/11   Historical Provider, MD  hydrochlorothiazide (HYDRODIURIL) 25 MG tablet TAKE 1 TABLET BY MOUTH DAILY. 12/23/14   Lonie Peak Dixon, PA-C  ibuprofen (ADVIL,MOTRIN) 800 MG tablet Take 1 tablet (800 mg total) by mouth every 8 (eight) hours as needed. 01/27/16   Duanne Guess,  PA-C  Multiple Vitamin (MULTIVITAMIN) tablet Take 1 tablet by mouth daily.    Historical Provider, MD  tamsulosin (FLOMAX) 0.4 MG CAPS capsule Take 1 capsule (0.4 mg total) by mouth daily. Patient not taking: Reported on 08/10/2015 09/21/14   Alycia Rossetti, MD  traMADol (ULTRAM) 50 MG tablet Take 1 tablet (50 mg total) by mouth every 6 (six) hours as needed. 01/27/16   Duanne Guess, PA-C    Family History Family History  Problem Relation Age of Onset  . Heart disease Mother   . Hypertension Mother   . Cancer Neg Hx     Social History Social History  Substance Use Topics  . Smoking status: Former Smoker    Quit date: 08/14/2004  . Smokeless tobacco: Never Used  . Alcohol use No     Allergies   Aspartame and phenylalanine; Asa [aspirin]; Penicillins; and Skelaxin [metaxalone]   Review of Systems Review of Systems  Constitutional: Negative.   Cardiovascular: Negative for chest pain and leg swelling.  Gastrointestinal: Negative for abdominal pain.  Musculoskeletal: Positive for gait problem and joint swelling. Negative for back pain and neck pain.  Skin: Negative for color change, rash and wound.  Neurological: Negative for dizziness, syncope and weakness.  Psychiatric/Behavioral: Negative for confusion and hallucinations.  All other systems  reviewed and are negative.    Physical Exam Updated Vital Signs BP (!) 152/98 (BP Location: Left Arm)   Pulse 88   Temp 98.4 F (36.9 C) (Oral)   Resp 16   Ht 5\' 10"  (1.778 m)   Wt 73.9 kg   LMP 04/16/2012   SpO2 100%   BMI 23.39 kg/m   Physical Exam  Constitutional: She is oriented to person, place, and time. She appears well-developed and well-nourished. No distress.  HENT:  Head: Normocephalic and atraumatic.  Eyes: EOM are normal. Pupils are equal, round, and reactive to light.  Neck: Normal range of motion. Neck supple.  Cardiovascular: Normal rate and regular rhythm.   Pulmonary/Chest: No respiratory distress.    Musculoskeletal:       Left ankle: She exhibits decreased range of motion, swelling and ecchymosis. She exhibits no deformity, no laceration and normal pulse. Tenderness. Lateral malleolus and AITFL tenderness found. Achilles tendon exhibits no pain, no defect and normal Thompson's test results.  Patient is able to straight leg raise the knee. No pain with internal or external rotation of the hip. Nontender over the fifth metatarsal or medial deltoid ligament. Patient is able to ankle plantar flex and dorsiflex.  Neurological: She is alert and oriented to person, place, and time.  Skin: Skin is warm and dry.  Psychiatric: She has a normal mood and affect. Her behavior is normal. Judgment and thought content normal.     ED Treatments / Results  Labs (all labs ordered are listed, but only abnormal results are displayed) Labs Reviewed - No data to display  EKG  EKG Interpretation None       Radiology Dg Ankle Complete Left  Result Date: 01/27/2016 CLINICAL DATA:  [place] down steps.  Injury EXAM: LEFT ANKLE COMPLETE - 3+ VIEW COMPARISON:  11/23/2007 FINDINGS: There is no evidence of fracture, dislocation, or joint effusion. There is no evidence of arthropathy or other focal bone abnormality. Soft tissues are unremarkable. IMPRESSION: Negative. Electronically Signed   By: Franchot Gallo M.D.   On: 01/27/2016 20:07    Procedures Procedures (including critical care time) SPLINT APPLICATION Date/Time: 123XX123 PM Authorized by: Feliberto Gottron Consent: Verbal consent obtained. Risks and benefits: risks, benefits and alternatives were discussed Consent given by: patient Splint applied by: ED tech Location details: Left ankle  Splint type: Crutches ankle stirrup  Supplies used: Prefabricated ankle stirrup, crutches  Post-procedure: The splinted body part was neurovascularly unchanged following the procedure. Patient tolerance: Patient tolerated the procedure well with no  immediate complications.     Medications Ordered in ED Medications  oxyCODONE-acetaminophen (PERCOCET/ROXICET) 5-325 MG per tablet 1 tablet (1 tablet Oral Given 01/27/16 1950)  oxyCODONE-acetaminophen (PERCOCET/ROXICET) 5-325 MG per tablet (not administered)     Initial Impression / Assessment and Plan / ED Course  I have reviewed the triage vital signs and the nursing notes.  Pertinent labs & imaging results that were available during my care of the patient were reviewed by me and considered in my medical decision making (see chart for details).  Clinical Course    50 year old female with left ankle sprain. X-rays negative for any acute bony abnormality. She is given crutches, ankle stirrup splint. She will take ibuprofen and tramadol as needed for pain. Follow-up with orthopedics if no improvement in 7 days.  Final Clinical Impressions(s) / ED Diagnoses   Final diagnoses:  Sprain of anterior talofibular ligament of left ankle, initial encounter    New Prescriptions New Prescriptions  IBUPROFEN (ADVIL,MOTRIN) 800 MG TABLET    Take 1 tablet (800 mg total) by mouth every 8 (eight) hours as needed.   TRAMADOL (ULTRAM) 50 MG TABLET    Take 1 tablet (50 mg total) by mouth every 6 (six) hours as needed.     Duanne Guess, PA-C 01/27/16 2028    Schuyler Amor, MD 01/27/16 [phone]

## 2016-01-27 NOTE — Discharge Instructions (Signed)
Please rest ice and elevate the ankle. Take ibuprofen as needed for pain for 7 days. Follow-up with orthopedics if no improvement in 7 days.

## 2016-02-01 DIAGNOSIS — S82832A Other fracture of upper and lower end of left fibula, initial encounter for closed fracture: Secondary | ICD-10-CM | POA: Diagnosis not present

## 2016-02-01 DIAGNOSIS — M79672 Pain in left foot: Secondary | ICD-10-CM | POA: Diagnosis not present

## 2016-02-23 DIAGNOSIS — S92022D Displaced fracture of anterior process of left calcaneus, subsequent encounter for fracture with routine healing: Secondary | ICD-10-CM | POA: Diagnosis not present

## 2016-02-23 DIAGNOSIS — M79672 Pain in left foot: Secondary | ICD-10-CM | POA: Diagnosis not present

## 2016-02-23 DIAGNOSIS — S82832D Other fracture of upper and lower end of left fibula, subsequent encounter for closed fracture with routine healing: Secondary | ICD-10-CM | POA: Diagnosis not present

## 2016-03-12 ENCOUNTER — Other Ambulatory Visit: Payer: Self-pay | Admitting: Physician Assistant

## 2016-03-12 NOTE — Telephone Encounter (Signed)
Rx filled per protocol  

## 2016-03-15 DIAGNOSIS — S82832D Other fracture of upper and lower end of left fibula, subsequent encounter for closed fracture with routine healing: Secondary | ICD-10-CM | POA: Diagnosis not present

## 2016-03-15 DIAGNOSIS — S92022D Displaced fracture of anterior process of left calcaneus, subsequent encounter for fracture with routine healing: Secondary | ICD-10-CM | POA: Diagnosis not present

## 2016-03-15 DIAGNOSIS — M79672 Pain in left foot: Secondary | ICD-10-CM | POA: Diagnosis not present

## 2016-03-28 ENCOUNTER — Ambulatory Visit: Payer: 59 | Attending: Podiatry | Admitting: Physical Therapy

## 2016-03-28 ENCOUNTER — Encounter: Payer: Self-pay | Admitting: Physical Therapy

## 2016-03-28 DIAGNOSIS — M6281 Muscle weakness (generalized): Secondary | ICD-10-CM | POA: Insufficient documentation

## 2016-03-28 DIAGNOSIS — M25572 Pain in left ankle and joints of left foot: Secondary | ICD-10-CM | POA: Diagnosis not present

## 2016-03-28 DIAGNOSIS — R262 Difficulty in walking, not elsewhere classified: Secondary | ICD-10-CM | POA: Insufficient documentation

## 2016-03-28 NOTE — Patient Instructions (Addendum)
  AROM: Toe Curl    Sitting or lying with left heel supported, gently curl and straighten toes. Repeat _10___ times per set. Do __2__ sets per session. Do __2__ sessions per day.  http://orth.exer.us/61   Copyright  VHI. All rights reserved.  Ankle Circles    Slowly rotate right foot and ankle clockwise then counterclockwise. Gradually increase range of motion. Avoid pain. This would be a good one to do while having your foot in the tub.  Circle _10___ times each direction per set. Do __2__ sets per session. Do __2__ sessions per day.  http://orth.exer.us/31   Copyright  VHI. All rights reserved.  Alphabet    Place pillow under calf so foot does not rub. Moving foot and ankle only, trace the letters of the alphabet from A to Z. Repeat _2___ times. Do 2____ sessions per day.  Copyright  VHI. All rights reserved.  Marching In-Place    Standing straight, alternate bringing knees toward trunk. Hold onto counter with Right hand Do __2_ sets. Do 10___ times per day.  Copyright  VHI. All rights reserved.  Weight Shift: Lateral (Righting / Equilibrium)    Feet shoulder width apart, slowly shift weight over left leg, bending head and trunk slightly to left, left arm hanging out from side. Return to starting position. Shift weight over right leg, bending head and trunk slightly to right, right arm hanging out from side. Repeat _10___ times per session. Do __2__ sessions per day.  Copyright  VHI. All rights reserved.

## 2016-03-28 NOTE — Therapy (Signed)
[place] MAIN [facility] SERVICES [address] [place], [place], [address] Phone: [phone]   Fax:  [phone]  Physical Therapy Evaluation  Patient Details  Name: Jennifer Castaneda MRN: [id number] Date of Birth: January 23, 1966 Referring Provider: Dr. Sharlotte Alamo  Encounter Date: 03/28/2016      PT End of Session - 03/28/16 1628    Visit Number 1   Number of Visits 19   Date for PT Re-Evaluation 05/09/16   PT Start Time 6962   PT Stop Time 1615   PT Time Calculation (min) 60 min   Activity Tolerance Patient tolerated treatment well   Behavior During Therapy [facility] for tasks assessed/performed      Past Medical History:  Diagnosis Date  . Allergy   . History of nephrolithiasis   . Hypertension   . Migraine headache     Past Surgical History:  Procedure Laterality Date  . BREAST SURGERY     cyst  . CESAREAN SECTION    . NASAL SINUS SURGERY      There were no vitals filed for this visit.       Subjective Assessment - 03/28/16 1525    Subjective 50 yo Female reports falling down garage steps on 01/27/16 and landed on concrete. She reports hearing a pop and was in excruciating pain. she went to ED and had X-ray which was negative for fracture. She reports that ER physician sent her home and said to take it easy and go back to work in 1 week;  She was given a soft lace up brace and reports that it was just too painful to put on. She was given a soft cast and referred to Dr. Cleda Mccreedy. She went to see Dr. Cleda Mccreedy mid of following week; X-rays showed 2 fractures; She was given a boot and given crutches. They have been managing fracture conservatively. She reports being unsure of weight bearing restrictions. She reports self limiting to NWB on LLE to toe touch weight bearing to protect foot;    Pertinent History personal factors affecting rehab: chronic condition, significant pain, weakness, not able to work, frustration/emotional stress;    Limitations Standing;Walking   How long can you stand comfortably? 10 min   How long can you walk comfortably? <300 feet with B axillary crutches   Diagnostic tests X-rays, possible avulsion fracture   Currently in Pain? Yes   Pain Score 4    Pain Location Ankle   Pain Orientation Left;Lateral   Pain Descriptors / Indicators Burning;Sharp   Pain Type Acute pain   Pain Radiating Towards lateral ankle to top of foot;    Pain Onset More than a month ago   Pain Frequency Constant   Aggravating Factors  worse with weight bearing; worse in evening as compared to morning; tender;    Pain Relieving Factors elevation,    Effect of Pain on Daily Activities decreased;             [facility] PT Assessment - 03/28/16 0001      Assessment   Medical Diagnosis LLE calcaneal fracture   Referring Provider Dr. Sharlotte Alamo   Onset Date/Surgical Date 01/27/16   Hand Dominance Right   Next MD Visit 04/05/16   Prior Therapy denies any PT for this condition;      Restrictions   Weight Bearing Restrictions --  no, progress weight bearing;      Balance Screen   Has the patient fallen in the past 6 months Yes  How many times? 1   Has the patient had a decrease in activity level because of a fear of falling?  Yes   Is the patient reluctant to leave their home because of a fear of falling?  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)   Leisure walking, gym, weight lifting challenge; outside person; play with dogs;      Cognition   Overall Cognitive Status Within Functional Limits for tasks assessed     Observation/Other  Assessments   Lower Extremity Functional Scale  25/80 (the lower the score the greater the disability)     Circumferential Edema   Circumferential - Right 23   Circumferential - Left  26     Figure 8 Edema   Figure 8 - Right  50   Figure 8 - Left  53     Sensation   Light Touch Appears Intact     Coordination   Gross Motor Movements are Fluid and Coordinated Yes   Fine Motor Movements are Fluid and Coordinated Yes     AROM   Right Ankle Dorsiflexion 12   Right Ankle Plantar Flexion 70   Right Ankle Inversion 32   Right Ankle Eversion 25   Left Ankle Dorsiflexion -10   Left Ankle Plantar Flexion 60   Left Ankle Inversion 20   Left Ankle Eversion 2     Strength   Overall Strength Comments RLE is WNL; LLE hip/knee 5/5, ankle: grossly 2/5 with pain with MMT     Palpation   Palpation comment moderate tenderness along 5th met head, lateral calcaneus and along peroneal tendons;      Transfers   Comments mod I for sit<>Stand transfers; requires rail assist to push up;      Ambulation/Gait   Gait Comments ambulates with B axillary crutches;      Standardized Balance Assessment   10 Meter Walk 0.66 m/s with crutches, home ambulator (increased risk for falls)       TREATMENT: PT initiated HEP: See patient instructions: Educated in left ankle toe flexion/extension towel crunch x5 reps; LLE ankle ROM: circles with education to perform in tub with warm water if needed; LLE ankle alphabet; Educated patient in standing weight shifts x10 reps, LLE stance, RLE march x10 to facilitate increased weight bearing on LLE;                     PT Education - 03/28/16 1628    Education provided Yes   Education Details initiated HEP, recommendations;    Person(s) Educated Patient   Methods Explanation;Verbal cues   Comprehension Verbalized understanding;Returned demonstration;Verbal cues required             PT Long Term Goals - 03/28/16 1816      PT LONG  TERM GOAL #1   Title Patient will be independent in home exercise program to improve strength/mobility for better functional independence with ADLs.   Time 6   Period Weeks   Status New     PT LONG TERM GOAL #2   Title Patient will increase 10 meter  walk test to >1.39ms as to improve gait speed for better community ambulation and to reduce fall risk.   Time 6   Period Weeks   Status New     PT LONG TERM GOAL #3   Title Patient will report a worst pain of 3/10 on VAS in  left ankle/foot           to improve tolerance with ADLs and reduced symptoms with activities.    Time 6   Period Weeks   Status New     PT LONG TERM GOAL #4   Title Patient will increase lower extremity functional scale to >60/80 to demonstrate improved functional mobility and increased tolerance with ADLs.    Time 6   Period Weeks   Status New     PT LONG TERM GOAL #5   Title patient will increase LLE ankle ROM: DF: 5 degrees, PF: 60 degrees, IV: 25 degrees, EV: 10 degrees for better functional ROM for standing/walking tasks.    Time 6   Period Weeks   Status New               Plan - 03/28/16 1629    Clinical Impression Statement 50yo Female reports traumatic fall at home on 01/27/16 with immediate onset of left ankle pain; patient went to ED and was diagnosed with sprain. She continued to have pain and went to see podiatrist, additional X-rays reveal potential avulsion fracture. Patient has been wearing a boot since and reports continued left ankle pain and swell. She demonstrates approximately 3 cm increased swelling on LLE ankle as compared to RLE ankle. She demonstrates incresed enderness along 5th met head and lateral side of ankle. Patient also demonstrates increased weakness in LLE ankle which affects stance control and gait ability; she would benefit from additional skilled PT intervention to improve ankle ROM/strength and reduce pain with ADLs.    Rehab Potential Fair   Clinical Impairments  Affecting Rehab Potential positive: young in age, family support; negative: chronic pain, possible CRPS; patient's clinical presentation is evolving due to pain fluctation and limited mobility;    PT Frequency 3x / week   PT Duration 6 weeks   PT Treatment/Interventions ADLs/Self Care Home Management;Aquatic Therapy;Cryotherapy;Electrical Stimulation;Gait training;DME Instruction;Ultrasound;Moist Heat;Iontophoresis 460mml Dexamethasone;Stair training;Functional mobility training;Therapeutic activities;Therapeutic exercise;Balance training;Neuromuscular re-education;Manual techniques;Patient/family education;Passive range of motion;Dry needling;Energy conservation;Taping   PT Next Visit Plan ROM/strength, manual therapy, stabilization exercise   PT Home Exercise Plan initiated- see patient instructions   Consulted and Agree with Plan of Care Patient      Patient will benefit from skilled therapeutic intervention in order to improve the following deficits and impairments:  Pain, Decreased mobility, Decreased endurance, Decreased range of motion, Decreased strength, Hypomobility, Impaired flexibility, Difficulty walking, Decreased balance  Visit Diagnosis: Pain in left ankle and joints of left foot - Plan: PT plan of care cert/re-cert  Muscle weakness (generalized) - Plan: PT plan of care cert/re-cert  Difficulty in walking, not elsewhere classified - Plan: PT plan of care cert/re-cert     Problem List Patient Active Problem List   Diagnosis Date Noted  . Hypertension   . Migraine headache   . History of nephrolithiasis   . Allergy     Xue Low PT, DPT 03/28/2016, 6:19 PM  [place] [facility] [address] [place]: [phone] Fax:  [phone]: JoRUMAYSA SABATINORN: [id number] of Birth: 7/05-Jun-1965

## 2016-03-30 ENCOUNTER — Ambulatory Visit: Payer: 59

## 2016-03-30 DIAGNOSIS — M25572 Pain in left ankle and joints of left foot: Secondary | ICD-10-CM

## 2016-03-30 DIAGNOSIS — M6281 Muscle weakness (generalized): Secondary | ICD-10-CM

## 2016-03-30 DIAGNOSIS — R262 Difficulty in walking, not elsewhere classified: Secondary | ICD-10-CM | POA: Diagnosis not present

## 2016-03-30 NOTE — Therapy (Signed)
[place] MAIN [facility] SERVICES [address] [place], [place], [address] Phone: [phone]   Fax:  [phone]  Physical Therapy Treatment  Patient Details  Name: Jennifer Castaneda MRN: [id number] Date of Birth: [birth year] Referring Provider: Dr. Sharlotte Alamo  Encounter Date: 03/30/2016      PT End of Session - 03/30/16 1134    Visit Number 2   Number of Visits 19   Date for PT Re-Evaluation 05/09/16   PT Start Time [id number]   PT Stop Time 1050   PT Time Calculation (min) 35 min   Activity Tolerance Patient tolerated treatment well   Behavior During Therapy [facility] for tasks assessed/performed      Past Medical History:  Diagnosis Date  . Allergy   . History of nephrolithiasis   . Hypertension   . Migraine headache     Past Surgical History:  Procedure Laterality Date  . BREAST SURGERY     cyst  . CESAREAN SECTION    . NASAL SINUS SURGERY      There were no vitals filed for this visit.      Subjective Assessment - 03/30/16 1130    Subjective Patient reports she has increased soreness oalong the top and lateral aspect of her ankle. States she is unable to ambulate from her car at the hospital to the therapy gym without incresaed soreness    Pertinent History personal factors affecting rehab: chronic condition, significant pain, weakness, not able to work, frustration/emotional stress;    Limitations Standing;Walking   How long can you stand comfortably? 10 min   How long can you walk comfortably? <300 feet with B axillary crutches   Diagnostic tests X-rays, possible avulsion fracture   Pain Score 4    Pain Location Ankle   Pain Orientation Left;Lateral   Pain Descriptors / Indicators Burning;Sore;Stabbing   Pain Type Acute pain   Pain Onset More than a month ago      TREATMENT: Manual therapy: STM to the distal aspect of the Achilles to decrease pain and spasms. Joint mobs grade I-II between all metatarsals, TC;  distraction to subtalar joint - stopped due to increased soreness. 3- 30sec each direction. Contract relax for improving dorsiflexion  -- x 5 with 10 sec holds.  Therapeutic Exercise:  BAPS level 3 - x 3 min DF/PF with cueing on ankle positioning and keeping the foot flat on the board Inversion with Yellow theraband - with cueing on LE positioning - x 25  Eversion with Yellow therapand - with cueing on LE positioning - x 25 Plantarflexion with Yellow theraband - with cueing on LE positioning - x 25 Dorsiflexion with Yellow theraband - with cueing on LE positioning - x 25 Dorsiflexion/plantarflexion; inversion/eversion - x 20 for improving AROM       PT Education - 03/30/16 1134    Education provided Yes   Education Details HEP: added 4 way ankle with yellow band   Person(s) Educated Patient   Methods Explanation;Handout   Comprehension Verbalized understanding;Returned demonstration             PT Long Term Goals - 03/28/16 1816      PT LONG TERM GOAL #1   Title Patient will be independent in home exercise program to improve strength/mobility for better functional independence with ADLs.   Time 6   Period Weeks   Status New     PT LONG TERM GOAL #2   Title Patient will increase 10 meter walk  test to >1.28m/s as to improve gait speed for better community ambulation and to reduce fall risk.   Time 6   Period Weeks   Status New     PT LONG TERM GOAL #3   Title Patient will report a worst pain of 3/10 on VAS in  left ankle/foot           to improve tolerance with ADLs and reduced symptoms with activities.    Time 6   Period Weeks   Status New     PT LONG TERM GOAL #4   Title Patient will increase lower extremity functional scale to >60/80 to demonstrate improved functional mobility and increased tolerance with ADLs.    Time 6   Period Weeks   Status New     PT LONG TERM GOAL #5   Title patient will increase LLE ankle ROM: DF: 5 degrees, PF: 60 degrees, IV: 25 degrees,  EV: 10 degrees for better functional ROM for standing/walking tasks.    Time 6   Period Weeks   Status New               Plan - 03/30/16 1135    Clinical Impression Statement Patient demonstrates increased pain along her achilles ~ 2 in from distal insertion and demonstrates significant TTP indicating possible achilles involvement. Focused on improving joint motion and initiating improvement of muscular strength along the ankle stabilizers. Patient tolerated grade II mobilizations with only minimal increase in soreness, unable to perform grade III without significant increase in soreness. Patient demonstrates fair muscular coordination in [employer] performance. Patient will benefit from further skilled therapy focused on improving muscular strength/endurance and manual therapy to decrease pain in order to return to prior level of function.    Rehab Potential Fair   Clinical Impairments Affecting Rehab Potential positive: young in age, family support; negative: chronic pain, possible CRPS; patient's clinical presentation is evolving due to pain fluctation and limited mobility;    PT Frequency 3x / week   PT Duration 6 weeks   PT Treatment/Interventions ADLs/Self Care Home Management;Aquatic Therapy;Cryotherapy;Electrical Stimulation;Gait training;DME Instruction;Ultrasound;Moist Heat;Iontophoresis 4mg /ml Dexamethasone;Stair training;Functional mobility training;Therapeutic activities;Therapeutic exercise;Balance training;Neuromuscular re-education;Manual techniques;Patient/family education;Passive range of motion;Dry needling;Energy conservation;Taping   PT Next Visit Plan ROM/strength, manual therapy, stabilization exercise   PT Home Exercise Plan initiated- see patient instructions   Consulted and Agree with Plan of Care Patient      Patient will benefit from skilled therapeutic intervention in order to improve the following deficits and impairments:  Pain, Decreased mobility, Decreased  endurance, Decreased range of motion, Decreased strength, Hypomobility, Impaired flexibility, Difficulty walking, Decreased balance  Visit Diagnosis: Pain in left ankle and joints of left foot  Muscle weakness (generalized)  Difficulty in walking, not elsewhere classified     Problem List Patient Active Problem List   Diagnosis Date Noted  . Hypertension   . Migraine headache   . History of nephrolithiasis   . Allergy     Blythe Stanford, PT DPT 03/30/2016, 11:39 AM  [place] MAIN [facility] SERVICES [address] [place], [place], [address] Phone: [phone]   Fax:  [phone]  Name: Jennifer Castaneda MRN: [id number] Date of Birth: 1966/04/14

## 2016-04-03 ENCOUNTER — Encounter: Payer: Self-pay | Admitting: Physical Therapy

## 2016-04-03 ENCOUNTER — Ambulatory Visit: Payer: 59 | Admitting: Physical Therapy

## 2016-04-03 DIAGNOSIS — M25572 Pain in left ankle and joints of left foot: Secondary | ICD-10-CM | POA: Diagnosis not present

## 2016-04-03 DIAGNOSIS — M6281 Muscle weakness (generalized): Secondary | ICD-10-CM

## 2016-04-03 DIAGNOSIS — R262 Difficulty in walking, not elsewhere classified: Secondary | ICD-10-CM

## 2016-04-03 NOTE — Therapy (Signed)
[place] MAIN [facility] SERVICES [address] [place], [place], [address] Phone: [phone]   Fax:  [phone]  Physical Therapy Treatment  Patient Details  Name: Jennifer Castaneda MRN: [id number] Date of Birth: 07/09/1965 Referring Provider: Dr. Sharlotte Alamo  Encounter Date: 04/03/2016      PT End of Session - 04/03/16 1439    Visit Number 3   Number of Visits 19   Date for PT Re-Evaluation 05/09/16   PT Start Time 1300   PT Stop Time 1345   PT Time Calculation (min) 45 min   Activity Tolerance Patient tolerated treatment well;No increased pain   Behavior During Therapy [facility] for tasks assessed/performed      Past Medical History:  Diagnosis Date  . Allergy   . History of nephrolithiasis   . Hypertension   . Migraine headache     Past Surgical History:  Procedure Laterality Date  . BREAST SURGERY     cyst  . CESAREAN SECTION    . NASAL SINUS SURGERY      There were no vitals filed for this visit.      Subjective Assessment - 04/03/16 1314    Subjective Patient reports doing a little better; she reports compliance with HEP; she reports trying to do a little more standing with the boot.    Pertinent History personal factors affecting rehab: chronic condition, significant pain, weakness, not able to work, frustration/emotional stress;    Limitations Standing;Walking   How long can you stand comfortably? 10 min   How long can you walk comfortably? <300 feet with B axillary crutches   Diagnostic tests X-rays, possible avulsion fracture   Currently in Pain? Yes   Pain Score 3    Pain Location Ankle   Pain Orientation Left   Pain Descriptors / Indicators Burning;Sore   Pain Type Acute pain   Pain Onset More than a month ago            [facility] PT Assessment - 04/03/16 0001      AROM   Left Ankle Dorsiflexion 0   Left Ankle Plantar Flexion 62   Left Ankle Inversion 25   Left Ankle Eversion 15      TREATMENT:  Leg press: LLE only: 30# 2x12 with min VCS for positioning and to slow down eccentric return for better strengthening;   Manual therapy: STM to the distal aspect of the Achilles to decrease pain and spasms. Performed ASTYM with edge tool to posterior left calf and along achilles with cross friction to improve tissue extensibility x12 min; Patient in long sitting: Passive LLE ankle DF with toe extension stretch 20 sec hold x2; Grade II-III PA/AP mobs along 1-5th metatarsals x10 sec each;  Long sitting:  Inversion with Yellow theraband -x15,   Eversion with Yellow therapand - x15,  Plantarflexion with Yellow theraband - x15,  Dorsiflexion with Yellow theraband -x15  patient required min Vcs to improve ankle ROM for better strengthening; She demonstrates less discomfort as compared to previous sessions;  Gait without crutches with LLE boot x20 feet; Patient reports less discomfort; She does demonstrate slight antalgic gait on LLE; She was educated about working towards weaning off crutches for increased weight bearing on LLE;                       PT Education - 04/03/16 1439    Education provided Yes   Education Details LE ROM/strengthening, manual therapy  Person(s) Educated Patient   Methods Explanation;Verbal cues   Comprehension Verbalized understanding;Returned demonstration;Verbal cues required             PT Long Term Goals - 03/28/16 1816      PT LONG TERM GOAL #1   Title Patient will be independent in home exercise program to improve strength/mobility for better functional independence with ADLs.   Time 6   Period Weeks   Status New     PT LONG TERM GOAL #2   Title Patient will increase 10 meter walk test to >1.67m/s as to improve gait speed for better community ambulation and to reduce fall risk.   Time 6   Period Weeks   Status New     PT LONG TERM GOAL #3   Title Patient will report a worst pain of 3/10 on VAS in  left  ankle/foot           to improve tolerance with ADLs and reduced symptoms with activities.    Time 6   Period Weeks   Status New     PT LONG TERM GOAL #4   Title Patient will increase lower extremity functional scale to >60/80 to demonstrate improved functional mobility and increased tolerance with ADLs.    Time 6   Period Weeks   Status New     PT LONG TERM GOAL #5   Title patient will increase LLE ankle ROM: DF: 5 degrees, PF: 60 degrees, IV: 25 degrees, EV: 10 degrees for better functional ROM for standing/walking tasks.    Time 6   Period Weeks   Status New               Plan - 04/03/16 1439    Clinical Impression Statement Patient responded well to manual therapy; She reports less discomfort and less tightness following manual therapy; Patient able to demonstrate significant improvement in LLE ankle ROM as compared to previous visits. She reports continued discomfort with excessive weight bearing and with end range tband exercise. patient would benefit from additional skilled PT Intervention to improve ankle strength and reduce pain with ADLs;    Rehab Potential Fair   Clinical Impairments Affecting Rehab Potential positive: young in age, family support; negative: chronic pain, possible CRPS; patient's clinical presentation is evolving due to pain fluctation and limited mobility;    PT Frequency 3x / week   PT Duration 6 weeks   PT Treatment/Interventions ADLs/Self Care Home Management;Aquatic Therapy;Cryotherapy;Electrical Stimulation;Gait training;DME Instruction;Ultrasound;Moist Heat;Iontophoresis 4mg /ml Dexamethasone;Stair training;Functional mobility training;Therapeutic activities;Therapeutic exercise;Balance training;Neuromuscular re-education;Manual techniques;Patient/family education;Passive range of motion;Dry needling;Energy conservation;Taping   PT Next Visit Plan ROM/strength, manual therapy, stabilization exercise   PT Home Exercise Plan initiated- see patient  instructions   Consulted and Agree with Plan of Care Patient      Patient will benefit from skilled therapeutic intervention in order to improve the following deficits and impairments:  Pain, Decreased mobility, Decreased endurance, Decreased range of motion, Decreased strength, Hypomobility, Impaired flexibility, Difficulty walking, Decreased balance  Visit Diagnosis: Pain in left ankle and joints of left foot  Muscle weakness (generalized)  Difficulty in walking, not elsewhere classified     Problem List Patient Active Problem List   Diagnosis Date Noted  . Hypertension   . Migraine headache   . History of nephrolithiasis   . Allergy     Karee Forge PT, DPT 04/03/2016, 2:41 PM  [place] MAIN [facility] SERVICES [address] [place], [place], [address] Phone: [phone]  Fax:  [phone]  Name: MASA LUBIN MRN: [id number] Date of Birth: 03/27/1966

## 2016-04-04 ENCOUNTER — Ambulatory Visit: Payer: 59

## 2016-04-04 DIAGNOSIS — M25572 Pain in left ankle and joints of left foot: Secondary | ICD-10-CM | POA: Diagnosis not present

## 2016-04-04 DIAGNOSIS — R262 Difficulty in walking, not elsewhere classified: Secondary | ICD-10-CM

## 2016-04-04 DIAGNOSIS — M6281 Muscle weakness (generalized): Secondary | ICD-10-CM | POA: Diagnosis not present

## 2016-04-04 NOTE — Therapy (Signed)
[place] MAIN [facility] SERVICES [address] [place], [place], [address] Phone: [phone]   Fax:  [phone]  Physical Therapy Treatment  Patient Details  Name: Jennifer Castaneda MRN: [id number] Date of Birth: 06/27/1965 Referring Provider: Dr. Sharlotte Alamo  Encounter Date: 04/04/2016      PT End of Session - 04/04/16 0957    Visit Number 4   Number of Visits 19   Date for PT Re-Evaluation 05/09/16   PT Start Time 0900   PT Stop Time 0945   PT Time Calculation (min) 45 min   Activity Tolerance Patient tolerated treatment well;No increased pain   Behavior During Therapy WFL for tasks assessed/performed      Past Medical History:  Diagnosis Date  . Allergy   . History of nephrolithiasis   . Hypertension   . Migraine headache     Past Surgical History:  Procedure Laterality Date  . BREAST SURGERY     cyst  . CESAREAN SECTION    . NASAL SINUS SURGERY      There were no vitals filed for this visit.      Subjective Assessment - 04/04/16 0942    Subjective Patient reports her ankle is very sore and was able to ambulate more without the crutches.   Pertinent History personal factors affecting rehab: chronic condition, significant pain, weakness, not able to work, frustration/emotional stress;    Limitations Standing;Walking   How long can you stand comfortably? 10 min   How long can you walk comfortably? <300 feet with B axillary crutches   Diagnostic tests X-rays, possible avulsion fracture   Currently in Pain? Yes   Pain Score 6    Pain Location Ankle   Pain Orientation Left   Pain Descriptors / Indicators Burning;Sore   Pain Type Acute pain   Pain Onset More than a month ago        Manual therapy: STM to the distal aspect of the Achilles to decrease pain and spasms. STM to posterior left calf and along achilles with cross friction to improve tissue extensibility x12 min;  Patient in long sitting: Passive LLE  ankle DF with toe extension stretch 20 sec hold x2; Grade II-III PA/AP mobs along 1-5th metatarsals 3x10 sec each;   Therapeutic Exercise:  BAPS level 3 - inversion/eversion, dorsiflexion/plantarflexion, clockwise/counterclockwise - 2 min  Towel scrunches in sitting - 2 x 20 Arch ups in sitting - x 20  Windshield wipers with flat foot - x 20         PT Education - 04/04/16 0944    Education provided Yes   Education Details Form and technique throughout exercise    Person(s) Educated Patient   Methods Explanation;Demonstration   Comprehension Verbalized understanding;Returned demonstration             PT Long Term Goals - 03/28/16 1816      PT LONG TERM GOAL #1   Title Patient will be independent in home exercise program to improve strength/mobility for better functional independence with ADLs.   Time 6   Period Weeks   Status New     PT LONG TERM GOAL #2   Title Patient will increase 10 meter walk test to >1.80m/s as to improve gait speed for better community ambulation and to reduce fall risk.   Time 6   Period Weeks   Status New     PT LONG TERM GOAL #3   Title Patient will report a worst pain  of 3/10 on VAS in  left ankle/foot           to improve tolerance with ADLs and reduced symptoms with activities.    Time 6   Period Weeks   Status New     PT LONG TERM GOAL #4   Title Patient will increase lower extremity functional scale to >60/80 to demonstrate improved functional mobility and increased tolerance with ADLs.    Time 6   Period Weeks   Status New     PT LONG TERM GOAL #5   Title patient will increase LLE ankle ROM: DF: 5 degrees, PF: 60 degrees, IV: 25 degrees, EV: 10 degrees for better functional ROM for standing/walking tasks.    Time 6   Period Weeks   Status New               Plan - 04/04/16 0957    Clinical Impression Statement Focused on performing mobility, coordination, instrinsic foot muscular strengthening secondary to increased  soreness in the patient's ankle. Patient demonstrates improved ankle mobility compared to previous visits indicting functional carryover between visits. Patient will benefit from further skilled therapy to return to prior level of function.    Rehab Potential Fair   Clinical Impairments Affecting Rehab Potential positive: young in age, family support; negative: chronic pain, possible CRPS; patient's clinical presentation is evolving due to pain fluctation and limited mobility;    PT Frequency 3x / week   PT Duration 6 weeks   PT Treatment/Interventions ADLs/Self Care Home Management;Aquatic Therapy;Cryotherapy;Electrical Stimulation;Gait training;DME Instruction;Ultrasound;Moist Heat;Iontophoresis 4mg /ml Dexamethasone;Stair training;Functional mobility training;Therapeutic activities;Therapeutic exercise;Balance training;Neuromuscular re-education;Manual techniques;Patient/family education;Passive range of motion;Dry needling;Energy conservation;Taping   PT Next Visit Plan ROM/strength, manual therapy, stabilization exercise   PT Home Exercise Plan initiated- see patient instructions   Consulted and Agree with Plan of Care Patient      Patient will benefit from skilled therapeutic intervention in order to improve the following deficits and impairments:  Pain, Decreased mobility, Decreased endurance, Decreased range of motion, Decreased strength, Hypomobility, Impaired flexibility, Difficulty walking, Decreased balance  Visit Diagnosis: Pain in left ankle and joints of left foot  Muscle weakness (generalized)  Difficulty in walking, not elsewhere classified     Problem List Patient Active Problem List   Diagnosis Date Noted  . Hypertension   . Migraine headache   . History of nephrolithiasis   . Allergy     Blythe Stanford, PT DPT 04/04/2016, 10:05 AM  [place] MAIN [facility] SERVICES [address] [place], [place], [address] Phone: [phone]    Fax:  [phone]  Name: JAYDIN MURESAN MRN: [id number] Date of Birth: [birth year]

## 2016-04-05 ENCOUNTER — Encounter: Payer: Self-pay | Admitting: Physical Therapy

## 2016-04-05 ENCOUNTER — Ambulatory Visit: Payer: 59 | Admitting: Physical Therapy

## 2016-04-05 DIAGNOSIS — M25572 Pain in left ankle and joints of left foot: Secondary | ICD-10-CM | POA: Diagnosis not present

## 2016-04-05 DIAGNOSIS — R262 Difficulty in walking, not elsewhere classified: Secondary | ICD-10-CM | POA: Diagnosis not present

## 2016-04-05 DIAGNOSIS — M6281 Muscle weakness (generalized): Secondary | ICD-10-CM

## 2016-04-05 DIAGNOSIS — S92022D Displaced fracture of anterior process of left calcaneus, subsequent encounter for fracture with routine healing: Secondary | ICD-10-CM | POA: Diagnosis not present

## 2016-04-05 DIAGNOSIS — S92215D Nondisplaced fracture of cuboid bone of left foot, subsequent encounter for fracture with routine healing: Secondary | ICD-10-CM | POA: Diagnosis not present

## 2016-04-05 NOTE — Therapy (Signed)
[place] MAIN [facility] SERVICES [address] [place], [place], [address] Phone: [phone]   Fax:  [phone]  Physical Therapy Treatment  Patient Details  Name: Jennifer Castaneda MRN: [id number] Date of Birth: 09/30/1965 Referring Provider: Dr. Sharlotte Alamo  Encounter Date: 04/05/2016      PT End of Session - 04/05/16 1304    Visit Number 5   Number of Visits 19   Date for PT Re-Evaluation 05/09/16   PT Start Time 1301   PT Stop Time 1351   PT Time Calculation (min) 50 min   Activity Tolerance Patient tolerated treatment well   Behavior During Therapy [facility] for tasks assessed/performed      Past Medical History:  Diagnosis Date  . Allergy   . History of nephrolithiasis   . Hypertension   . Migraine headache     Past Surgical History:  Procedure Laterality Date  . BREAST SURGERY     cyst  . CESAREAN SECTION    . NASAL SINUS SURGERY      There were no vitals filed for this visit.      Subjective Assessment - 04/05/16 1307    Subjective Pt reports her ankle is still sore from ambulating without crutches after session on Tuesday (but improved from yesterday).     Pertinent History personal factors affecting rehab: chronic condition, significant pain, weakness, not able to work, frustration/emotional stress;    Limitations Standing;Walking   How long can you stand comfortably? 10 min   How long can you walk comfortably? <300 feet with B axillary crutches   Diagnostic tests X-rays, possible avulsion fracture   Currently in Pain? Yes   Pain Score 4    Pain Location Ankle   Pain Orientation Left   Pain Descriptors / Indicators Burning   Pain Type Chronic pain   Pain Onset More than a month ago   Multiple Pain Sites No       TREATMENT   Manual Therapy:  STM to the distal aspect of the L Achilles to decrease pain and spasms. STM to posterolateral left calf and along achilles with cross friction to improve tissue  extensibility x15 min  Patient in long sitting:  Passive LLE ankle DF with toe extension stretch 30 sec hold x2  Grade II-III L PA/AP mobs along 1-5th metatarsals 3x10 sec each  Grade III AP L talocrural mobs 3x30 seconds   Therapeutic Exercise:  Long sitting L gastroc stretch with towel 3x20 seconds  Seated L dorsiflexion with Yellow theraband. 2x10  Seated L inversion with Yellow theraband. 1x15, ?  Seated L eversion with Yellow therapand. 1x15,  BAPS level 3: clockwise/counterclockwise, DF/PF, inversion/eversion-x10 each direction              PT Education - 04/05/16 1303    Education provided Yes   Education Details Exercise technique   Person(s) Educated Patient   Methods Explanation;Tactile cues;Demonstration   Comprehension Verbalized understanding;Returned demonstration;Need further instruction             PT Long Term Goals - 03/28/16 1816      PT LONG TERM GOAL #1   Title Patient will be independent in home exercise program to improve strength/mobility for better functional independence with ADLs.   Time 6   Period Weeks   Status New     PT LONG TERM GOAL #2   Title Patient will increase 10 meter walk test to >1.63m/s as to improve gait speed for better  community ambulation and to reduce fall risk.   Time 6   Period Weeks   Status New     PT LONG TERM GOAL #3   Title Patient will report a worst pain of 3/10 on VAS in  left ankle/foot           to improve tolerance with ADLs and reduced symptoms with activities.    Time 6   Period Weeks   Status New     PT LONG TERM GOAL #4   Title Patient will increase lower extremity functional scale to >60/80 to demonstrate improved functional mobility and increased tolerance with ADLs.    Time 6   Period Weeks   Status New     PT LONG TERM GOAL #5   Title patient will increase LLE ankle ROM: DF: 5 degrees, PF: 60 degrees, IV: 25 degrees, EV: 10 degrees for better functional ROM for standing/walking tasks.     Time 6   Period Weeks   Status New               Plan - 04/05/16 1401    Clinical Impression Statement Pt tolerated all interventions well this date. Began session with STM to increase mobility and decrease soreness and pain before beginning strengthening exercises.  Instructed pt that instead of attempting to ambulate without crutches all the time, that she should ease into it and ambulate without crutches 1/2 the time in her home to prevent becoming so sore.  Pt verbalized understanding.  She will benefit from continued skilled PT interventions for improved mobility, balance, strength, and QOL.     Rehab Potential Fair   Clinical Impairments Affecting Rehab Potential positive: young in age, family support; negative: chronic pain, possible CRPS; patient's clinical presentation is evolving due to pain fluctation and limited mobility;    PT Frequency 3x / week   PT Duration 6 weeks   PT Treatment/Interventions ADLs/Self Care Home Management;Aquatic Therapy;Cryotherapy;Electrical Stimulation;Gait training;DME Instruction;Ultrasound;Moist Heat;Iontophoresis 4mg /ml Dexamethasone;Stair training;Functional mobility training;Therapeutic activities;Therapeutic exercise;Balance training;Neuromuscular re-education;Manual techniques;Patient/family education;Passive range of motion;Dry needling;Energy conservation;Taping   PT Next Visit Plan ROM/strength, manual therapy, stabilization exercise   PT Home Exercise Plan initiated- see patient instructions   Consulted and Agree with Plan of Care Patient      Patient will benefit from skilled therapeutic intervention in order to improve the following deficits and impairments:  Pain, Decreased mobility, Decreased endurance, Decreased range of motion, Decreased strength, Hypomobility, Impaired flexibility, Difficulty walking, Decreased balance  Visit Diagnosis: Pain in left ankle and joints of left foot  Muscle weakness (generalized)  Difficulty in  walking, not elsewhere classified     Problem List Patient Active Problem List   Diagnosis Date Noted  . Hypertension   . Migraine headache   . History of nephrolithiasis   . Allergy     Collie Siad PT, DPT 04/05/2016, 2:04 PM  [place] MAIN [facility] SERVICES [address] [place], [place], [address] Phone: [phone]   Fax:  [phone]  Name: Jennifer Castaneda MRN: [id number] Date of Birth: [birth year]

## 2016-04-10 ENCOUNTER — Ambulatory Visit: Payer: 59 | Admitting: Physical Therapy

## 2016-04-10 ENCOUNTER — Encounter: Payer: Self-pay | Admitting: Physical Therapy

## 2016-04-10 DIAGNOSIS — R262 Difficulty in walking, not elsewhere classified: Secondary | ICD-10-CM

## 2016-04-10 DIAGNOSIS — M6281 Muscle weakness (generalized): Secondary | ICD-10-CM

## 2016-04-10 DIAGNOSIS — M25572 Pain in left ankle and joints of left foot: Secondary | ICD-10-CM | POA: Diagnosis not present

## 2016-04-10 NOTE — Therapy (Signed)
[place] MAIN [facility] SERVICES [address] [place], [place], [address] Phone: [phone]   Fax:  [phone]  Physical Therapy Treatment  Patient Details  Name: Jennifer Castaneda MRN: [id number] Date of Birth: 01/18/1966 Referring Provider: Dr. Sharlotte Alamo  Encounter Date: 04/10/2016    Past Medical History:  Diagnosis Date  . Allergy   . History of nephrolithiasis   . Hypertension   . Migraine headache     Past Surgical History:  Procedure Laterality Date  . BREAST SURGERY     cyst  . CESAREAN SECTION    . NASAL SINUS SURGERY      There were no vitals filed for this visit.      Subjective Assessment - 04/10/16 1308    Subjective Patient reports increased left ankle pain since yesterday; She reports still using the heat some but isn't feeling as much relief;    Pertinent History personal factors affecting rehab: chronic condition, significant pain, weakness, not able to work, frustration/emotional stress;    Limitations Standing;Walking   How long can you stand comfortably? 10 min   How long can you walk comfortably? <300 feet with B axillary crutches   Diagnostic tests X-rays, possible avulsion fracture   Currently in Pain? Yes   Pain Score 6    Pain Location Foot   Pain Orientation Left  lateral plantar surface   Pain Descriptors / Indicators Burning   Pain Type Chronic pain   Pain Onset More than a month ago        TREATMENT:  Manual therapy: PT performed soft/deep tissue massage along left extensor digitorum and lateral soft tissue; performed cross friction and ASTYM with edge tool to facilitate better tissue extensibility and myofascial release; PT also performed sustained manual trigger point release therapy to proximal left extensor digitorum longus along muscle belly;  Grade II-III AP/PA mobs to left talocrual joint; Patient reports tenderness along anterior foot/ankle; 10 sec bouts x3 each; Patient  exhibits increased "clicking" at rest which could be indicate better flexibility and less swelling within ankle;  Long sitting: Yellow tband ankle strengthening AROM: DF, PF, IV/EV x15 reps each; Patient reports no increase in pain with exercise; She did require min cues to avoid LE movement and isolate ankle ROM;  Patient reports continued tenderness along left lateral achilles attachment to calcaneus. PT applied Iontophoresis, 4mg /ML dexamethasone x8 min with instruction on safety with wear time;   Patient reports less discomfort at end of treatment session; Instructed patient to continue wearing boot this week and we will work on getting into a shoe next week;                                 PT Long Term Goals - 03/28/16 1816      PT LONG TERM GOAL #1   Title Patient will be independent in home exercise program to improve strength/mobility for better functional independence with ADLs.   Time 6   Period Weeks   Status New     PT LONG TERM GOAL #2   Title Patient will increase 10 meter walk test to >1.11m/s as to improve gait speed for better community ambulation and to reduce fall risk.   Time 6   Period Weeks   Status New     PT LONG TERM GOAL #3   Title Patient will report a worst pain of 3/10 on VAS in  left ankle/foot           to improve tolerance with ADLs and reduced symptoms with activities.    Time 6   Period Weeks   Status New     PT LONG TERM GOAL #4   Title Patient will increase lower extremity functional scale to >60/80 to demonstrate improved functional mobility and increased tolerance with ADLs.    Time 6   Period Weeks   Status New     PT LONG TERM GOAL #5   Title patient will increase LLE ankle ROM: DF: 5 degrees, PF: 60 degrees, IV: 25 degrees, EV: 10 degrees for better functional ROM for standing/walking tasks.    Time 6   Period Weeks   Status New               Plan - 04/10/16 1332    Clinical Impression Statement  Patient responded well with manual therapy; She demonstrates less discomfot following soft/deep tissue massage to left extensor digitorum; Patient continues to have trigger points and tightness within extensor digitorum and would potentially benefit from dry needling to reduce discomfort; Patient continues to have tenderness localized to left lateral achilles attachment. PT applied iontophoresis with dexamethasone. She would benefit from additional skilled PT intervention to improve ankle strength/ROM and reduce pain with ADLs;    Rehab Potential Fair   Clinical Impairments Affecting Rehab Potential positive: young in age, family support; negative: chronic pain, possible CRPS; patient's clinical presentation is evolving due to pain fluctation and limited mobility;    PT Frequency 3x / week   PT Duration 6 weeks   PT Treatment/Interventions ADLs/Self Care Home Management;Aquatic Therapy;Cryotherapy;Electrical Stimulation;Gait training;DME Instruction;Ultrasound;Moist Heat;Iontophoresis 4mg /ml Dexamethasone;Stair training;Functional mobility training;Therapeutic activities;Therapeutic exercise;Balance training;Neuromuscular re-education;Manual techniques;Patient/family education;Passive range of motion;Dry needling;Energy conservation;Taping   PT Next Visit Plan ROM/strength, manual therapy, stabilization exercise   PT Home Exercise Plan reinforced;    Consulted and Agree with Plan of Care Patient      Patient will benefit from skilled therapeutic intervention in order to improve the following deficits and impairments:  Pain, Decreased mobility, Decreased endurance, Decreased range of motion, Decreased strength, Hypomobility, Impaired flexibility, Difficulty walking, Decreased balance  Visit Diagnosis: Pain in left ankle and joints of left foot  Muscle weakness (generalized)  Difficulty in walking, not elsewhere classified     Problem List Patient Active Problem List   Diagnosis Date Noted   . Hypertension   . Migraine headache   . History of nephrolithiasis   . Allergy     Trotter,Margaret PT, DPT 04/10/2016, 1:52 PM  [place] MAIN [facility] SERVICES [address] [place], [place], [address] Phone: [phone]   Fax:  [phone]  Name: Jennifer Castaneda MRN: [id number] Date of Birth: Aug 19, 1965

## 2016-04-12 ENCOUNTER — Ambulatory Visit: Payer: 59 | Admitting: Physical Therapy

## 2016-04-17 ENCOUNTER — Ambulatory Visit: Payer: 59 | Attending: Podiatry | Admitting: Physical Therapy

## 2016-04-17 ENCOUNTER — Encounter: Payer: Self-pay | Admitting: Physical Therapy

## 2016-04-17 DIAGNOSIS — M25572 Pain in left ankle and joints of left foot: Secondary | ICD-10-CM | POA: Diagnosis not present

## 2016-04-17 DIAGNOSIS — R262 Difficulty in walking, not elsewhere classified: Secondary | ICD-10-CM | POA: Insufficient documentation

## 2016-04-17 DIAGNOSIS — M6281 Muscle weakness (generalized): Secondary | ICD-10-CM | POA: Insufficient documentation

## 2016-04-17 NOTE — Therapy (Signed)
[place] MAIN [facility] SERVICES [address] [place], [place], [address] Phone: [phone]   Fax:  [phone]  Physical Therapy Treatment  Patient Details  Name: Jennifer Castaneda MRN: [id number] Date of Birth: February 21, 1966 Referring Provider: Dr. Sharlotte Alamo  Encounter Date: 04/17/2016      PT End of Session - 04/17/16 1115    Visit Number 6   Number of Visits 19   Date for PT Re-Evaluation 05/09/16   PT Start Time 1113   PT Stop Time 1156   PT Time Calculation (min) 43 min   Activity Tolerance Patient tolerated treatment well   Behavior During Therapy [facility] for tasks assessed/performed      Past Medical History:  Diagnosis Date  . Allergy   . History of nephrolithiasis   . Hypertension   . Migraine headache     Past Surgical History:  Procedure Laterality Date  . BREAST SURGERY     cyst  . CESAREAN SECTION    . NASAL SINUS SURGERY      There were no vitals filed for this visit.      Subjective Assessment - 04/17/16 1115    Subjective She reports she was itchy for a few days following use of iontophoresis next session.  She presents today ambulating with boot and 1 crutch.  No other complaints or concerns.   Pertinent History personal factors affecting rehab: chronic condition, significant pain, weakness, not able to work, frustration/emotional stress;    Limitations Standing;Walking   How long can you stand comfortably? 10 min   How long can you walk comfortably? <300 feet with B axillary crutches   Diagnostic tests X-rays, possible avulsion fracture   Currently in Pain? Yes   Pain Score 3    Pain Location Ankle   Pain Orientation Left   Pain Descriptors / Indicators Aching   Pain Type Chronic pain   Pain Onset More than a month ago   Multiple Pain Sites No       TREATMENT   Manual Therapy:  STM to the distal aspect of the L Achilles to decrease pain and spasms. STM to posterolateral left calf and along  achilles with cross friction to improve tissue extensibility. Trigger points noted in these areas. x15 min  Patient in long sitting: Grade III AP and PA L talocrural mobs 3x30 seconds each direction   Therapeutic Exercise:  In long sitting: L inversion/eversion/PF/DF with RTB x20 each direction  Seated L gastroc stretch with towel 2x30 seconds  SLS LLE on airex with 2 fingers supported on bar, 2x45 seconds  Forward lunges onto L foot on airex 1x20. Again 1x20 with MWM to promote DF.  Sideways step up and over airex with 2 fingers supported. x10 each direction                PT Education - 04/17/16 1114    Education provided Yes   Education Details Exercise technique; education provided on goal of manual therapy techniques.   Person(s) Educated Patient   Methods Explanation;Demonstration;Verbal cues   Comprehension Verbalized understanding;Returned demonstration;Need further instruction             PT Long Term Goals - 03/28/16 1816      PT LONG TERM GOAL #1   Title Patient will be independent in home exercise program to improve strength/mobility for better functional independence with ADLs.   Time 6   Period Weeks   Status New     PT  LONG TERM GOAL #2   Title Patient will increase 10 meter walk test to >1.67m/s as to improve gait speed for better community ambulation and to reduce fall risk.   Time 6   Period Weeks   Status New     PT LONG TERM GOAL #3   Title Patient will report a worst pain of 3/10 on VAS in  left ankle/foot           to improve tolerance with ADLs and reduced symptoms with activities.    Time 6   Period Weeks   Status New     PT LONG TERM GOAL #4   Title Patient will increase lower extremity functional scale to >60/80 to demonstrate improved functional mobility and increased tolerance with ADLs.    Time 6   Period Weeks   Status New     PT LONG TERM GOAL #5   Title patient will increase LLE ankle ROM: DF: 5 degrees, PF: 60 degrees, IV:  25 degrees, EV: 10 degrees for better functional ROM for standing/walking tasks.    Time 6   Period Weeks   Status New               Plan - 04/17/16 1213    Clinical Impression Statement Did not attempt iontophoresis this session due to pt c/o itchiness following last trial last session.  Pt expressed concerns about having enough ankle mobility to sit or squat to pick something up.  This session focused on manual therapy and therpeutic exercise to improve mobility and ankle strength to work towrad functional activities.  She tolerated all interventions well and was instructed to follow HEP instructions provided at previous session.  She will continue to benefit from PT interventions for improved functional mobility and strengthening.   Rehab Potential Fair   Clinical Impairments Affecting Rehab Potential positive: young in age, family support; negative: chronic pain, possible CRPS; patient's clinical presentation is evolving due to pain fluctation and limited mobility;    PT Frequency 3x / week   PT Duration 6 weeks   PT Treatment/Interventions ADLs/Self Care Home Management;Aquatic Therapy;Cryotherapy;Electrical Stimulation;Gait training;DME Instruction;Ultrasound;Moist Heat;Iontophoresis 4mg /ml Dexamethasone;Stair training;Functional mobility training;Therapeutic activities;Therapeutic exercise;Balance training;Neuromuscular re-education;Manual techniques;Patient/family education;Passive range of motion;Dry needling;Energy conservation;Taping   PT Next Visit Plan ROM/strength, manual therapy, stabilization exercise   PT Home Exercise Plan reinforced;    Consulted and Agree with Plan of Care Patient      Patient will benefit from skilled therapeutic intervention in order to improve the following deficits and impairments:  Pain, Decreased mobility, Decreased endurance, Decreased range of motion, Decreased strength, Hypomobility, Impaired flexibility, Difficulty walking, Decreased  balance  Visit Diagnosis: Pain in left ankle and joints of left foot  Muscle weakness (generalized)  Difficulty in walking, not elsewhere classified     Problem List Patient Active Problem List   Diagnosis Date Noted  . Hypertension   . Migraine headache   . History of nephrolithiasis   . Allergy     Collie Siad PT, DPT 04/17/2016, 12:19 PM  [place] MAIN [facility] SERVICES [address] [place], [place], [address] Phone: [phone]   Fax:  [phone]  Name: SHIRAN CLAW MRN: [id number] Date of Birth: 08/26/1965

## 2016-04-19 ENCOUNTER — Encounter: Payer: Self-pay | Admitting: Physical Therapy

## 2016-04-19 ENCOUNTER — Ambulatory Visit: Payer: 59 | Admitting: Physical Therapy

## 2016-04-19 DIAGNOSIS — M6281 Muscle weakness (generalized): Secondary | ICD-10-CM | POA: Diagnosis not present

## 2016-04-19 DIAGNOSIS — R262 Difficulty in walking, not elsewhere classified: Secondary | ICD-10-CM | POA: Diagnosis not present

## 2016-04-19 DIAGNOSIS — M25572 Pain in left ankle and joints of left foot: Secondary | ICD-10-CM | POA: Diagnosis not present

## 2016-04-19 NOTE — Therapy (Signed)
[place] MAIN [facility] SERVICES [address] [place], [place], [address] Phone: [phone]   Fax:  [phone]  Physical Therapy Treatment  Patient Details  Name: Jennifer Castaneda MRN: [id number] Date of Birth: May 14, 1965 Referring Provider: Dr. Sharlotte Alamo  Encounter Date: 04/19/2016      PT End of Session - 04/19/16 1542    Visit Number 7   Number of Visits 19   Date for PT Re-Evaluation 05/09/16   PT Start Time [id number]   PT Stop Time [id number]   PT Time Calculation (min) 45 min   Activity Tolerance Patient tolerated treatment well;No increased pain   Behavior During Therapy WFL for tasks assessed/performed      Past Medical History:  Diagnosis Date  . Allergy   . History of nephrolithiasis   . Hypertension   . Migraine headache     Past Surgical History:  Procedure Laterality Date  . BREAST SURGERY     cyst  . CESAREAN SECTION    . NASAL SINUS SURGERY      There were no vitals filed for this visit.      Subjective Assessment - 04/19/16 1539    Subjective Patient reports doing well; She reports having just mild discomfort while wearing boot. She is able to ambulate with single crutch with reciprocal gait pattern without antalgic gait;    Pertinent History personal factors affecting rehab: chronic condition, significant pain, weakness, not able to work, frustration/emotional stress;    Limitations Standing;Walking   How long can you stand comfortably? 10 min   How long can you walk comfortably? <300 feet with B axillary crutches   Diagnostic tests X-rays, possible avulsion fracture   Currently in Pain? Yes   Pain Score 3    Pain Location Ankle   Pain Orientation Left   Pain Descriptors / Indicators Aching;Burning   Pain Type Chronic pain   Pain Onset More than a month ago            [facility] PT Assessment - 04/19/16 0001      AROM   Left Ankle Dorsiflexion 5   Left Ankle Plantar Flexion 60   Left Ankle  Inversion 25   Left Ankle Eversion 18       TREATMENT: Patient removed boot off LLE for better ankle mobility with exercise; Warm up on recumbent bike x4 min (unbilled)  Leg press: LLE only: 45#  2x10 with min VCs for positioning for better strengthening; LLE heel raises 15# 2x10 with min Vcs for positioning and to relax after each rep for better ankle ROM/strengthening;  Patient prone on mat table:   Manual therapy: PT performed soft/deep tissue massage along left extensor digitorum and lateral soft tissue; performed cross friction and ASTYM with edge tool to facilitate better tissue extensibility and myofascial release; PT also performed sustained manual trigger point release therapy to proximal left extensor digitorum longus along muscle belly x10 min Patient in long sitting: Grade II-III AP/PA mobs to left talocrual joint; Patient reports tenderness along anterior foot/ankle; 10 sec bouts x3 each; Patient exhibits increased "clicking" at rest which could be indicate better flexibility and less swelling within ankle;  Exercise: Long sitting: Red tband ankle strengthening AROM: DF, PF, IV/EV x10 reps each; Patient reports no increase in pain with exercise; She did require min cues to avoid LE movement and isolate ankle ROM;  PT reassessed ankle ROM- see above;  PT Education - 04/19/16 1542    Education provided Yes   Education Details exercise technique, HEP, how to wean off boot and wear shoe   Person(s) Educated Patient   Methods Explanation;Demonstration;Verbal cues   Comprehension Verbalized understanding;Returned demonstration;Verbal cues required             PT Long Term Goals - 03/28/16 1816      PT LONG TERM GOAL #1   Title Patient will be independent in home exercise program to improve strength/mobility for better functional independence with ADLs.   Time 6   Period Weeks   Status New     PT LONG TERM GOAL #2   Title  Patient will increase 10 meter walk test to >1.27m/s as to improve gait speed for better community ambulation and to reduce fall risk.   Time 6   Period Weeks   Status New     PT LONG TERM GOAL #3   Title Patient will report a worst pain of 3/10 on VAS in  left ankle/foot           to improve tolerance with ADLs and reduced symptoms with activities.    Time 6   Period Weeks   Status New     PT LONG TERM GOAL #4   Title Patient will increase lower extremity functional scale to >60/80 to demonstrate improved functional mobility and increased tolerance with ADLs.    Time 6   Period Weeks   Status New     PT LONG TERM GOAL #5   Title patient will increase LLE ankle ROM: DF: 5 degrees, PF: 60 degrees, IV: 25 degrees, EV: 10 degrees for better functional ROM for standing/walking tasks.    Time 6   Period Weeks   Status New               Plan - 04/19/16 1542    Clinical Impression Statement Patient instructed in advanced LLE ankle ROM and strengthening exercise. She was able to advance Leg press heel raises with LLE without boot for better ankle strengthening and ROM. Patient does continues to have stiffness in left ankle particularly with ankle DF and EV. She was instructed to start wearing a regular shoe for 1-2 hours each day and gradually increase time while wearing shoe for weaning off boot. Patient would benefit from additional skilled PT intervention to improve ankle ROM/strength and return to PLOF.    Rehab Potential Fair   Clinical Impairments Affecting Rehab Potential positive: young in age, family support; negative: chronic pain, possible CRPS; patient's clinical presentation is evolving due to pain fluctation and limited mobility;    PT Frequency 3x / week   PT Duration 6 weeks   PT Treatment/Interventions ADLs/Self Care Home Management;Aquatic Therapy;Cryotherapy;Electrical Stimulation;Gait training;DME Instruction;Ultrasound;Moist Heat;Iontophoresis 4mg /ml  Dexamethasone;Stair training;Functional mobility training;Therapeutic activities;Therapeutic exercise;Balance training;Neuromuscular re-education;Manual techniques;Patient/family education;Passive range of motion;Dry needling;Energy conservation;Taping   PT Next Visit Plan ROM/strength, manual therapy, stabilization exercise   PT Home Exercise Plan reinforced;    Consulted and Agree with Plan of Care Patient      Patient will benefit from skilled therapeutic intervention in order to improve the following deficits and impairments:  Pain, Decreased mobility, Decreased endurance, Decreased range of motion, Decreased strength, Hypomobility, Impaired flexibility, Difficulty walking, Decreased balance  Visit Diagnosis: Pain in left ankle and joints of left foot  Muscle weakness (generalized)  Difficulty in walking, not elsewhere classified     Problem List Patient Active Problem List   Diagnosis  Date Noted  . Hypertension   . Migraine headache   . History of nephrolithiasis   . Allergy     Trotter,Margaret PT, DPT 04/19/2016, 3:56 PM  [place] MAIN [facility] SERVICES [address] [place], [place], [address] Phone: [phone]   Fax:  [phone]  Name: Jennifer Castaneda MRN: [id number] Date of Birth: 1965-09-24

## 2016-04-20 ENCOUNTER — Ambulatory Visit: Payer: 59

## 2016-04-20 DIAGNOSIS — R262 Difficulty in walking, not elsewhere classified: Secondary | ICD-10-CM

## 2016-04-20 DIAGNOSIS — M6281 Muscle weakness (generalized): Secondary | ICD-10-CM | POA: Diagnosis not present

## 2016-04-20 DIAGNOSIS — M25572 Pain in left ankle and joints of left foot: Secondary | ICD-10-CM | POA: Diagnosis not present

## 2016-04-20 NOTE — Therapy (Signed)
[place] MAIN [facility] SERVICES [address] [place], [place], [address] Phone: [phone]   Fax:  [phone]  Physical Therapy Treatment  Patient Details  Name: Jennifer Castaneda MRN: [id number] Date of Birth: 1966-02-07 Referring Provider: Dr. Sharlotte Alamo  Encounter Date: 04/20/2016      PT End of Session - 04/20/16 0944    Visit Number 8   Number of Visits 19   Date for PT Re-Evaluation 05/09/16   PT Start Time 0915   PT Stop Time 1000   PT Time Calculation (min) 45 min   Activity Tolerance Patient tolerated treatment well;No increased pain   Behavior During Therapy WFL for tasks assessed/performed      Past Medical History:  Diagnosis Date  . Allergy   . History of nephrolithiasis   . Hypertension   . Migraine headache     Past Surgical History:  Procedure Laterality Date  . BREAST SURGERY     cyst  . CESAREAN SECTION    . NASAL SINUS SURGERY      There were no vitals filed for this visit.      Subjective Assessment - 04/20/16 0939    Subjective Patient reports minor ankle pain in the ankle today and increased stiffness.    Pertinent History personal factors affecting rehab: chronic condition, significant pain, weakness, not able to work, frustration/emotional stress;    Limitations Standing;Walking   How long can you stand comfortably? 10 min   How long can you walk comfortably? <300 feet with B axillary crutches   Diagnostic tests X-rays, possible avulsion fracture   Currently in Pain? Yes   Pain Score 2    Pain Location Ankle   Pain Orientation Left   Pain Descriptors / Indicators Aching;Tightness   Pain Type Chronic pain   Pain Onset More than a month ago       Manual therapy: STM to the distal aspect of the Achilles to decrease pain and spasms. STM to posterior left calf and along achilles with cross friction to improve tissue extensibility x3 min;  Patient in long sitting: Grade IV AP mobs to TC  joint - 3 x 30sec Long axis distraction to the TC joint - 3 x 30sec Distraction to ST - 3 x 30sec Grade -III PA/AP mobs along 1-5th metatarsals 3x10 sec each;    Therapeutic Exercise: Marching on airex - 2 x 20  Side stepping up and over - 2 x 10  Resisted DF in sitting against GTB - x30  BAPS level 2 in standing - inversion/eversion, dorsiflexion/plantarflexion -  1 min  Heel raises in standing on airex pad - 2 x 20  Tandem stance on airex pad in standing - 3 x 30 sec        PT Education - 04/20/16 0942    Education provided Yes   Education Details form and technique with exercise   Person(s) Educated Patient   Methods Explanation;Demonstration   Comprehension Verbalized understanding;Returned demonstration             PT Long Term Goals - 03/28/16 1816      PT LONG TERM GOAL #1   Title Patient will be independent in home exercise program to improve strength/mobility for better functional independence with ADLs.   Time 6   Period Weeks   Status New     PT LONG TERM GOAL #2   Title Patient will increase 10 meter walk test to >1.42m/s as to improve gait  speed for better community ambulation and to reduce fall risk.   Time 6   Period Weeks   Status New     PT LONG TERM GOAL #3   Title Patient will report a worst pain of 3/10 on VAS in  left ankle/foot           to improve tolerance with ADLs and reduced symptoms with activities.    Time 6   Period Weeks   Status New     PT LONG TERM GOAL #4   Title Patient will increase lower extremity functional scale to >60/80 to demonstrate improved functional mobility and increased tolerance with ADLs.    Time 6   Period Weeks   Status New     PT LONG TERM GOAL #5   Title patient will increase LLE ankle ROM: DF: 5 degrees, PF: 60 degrees, IV: 25 degrees, EV: 10 degrees for better functional ROM for standing/walking tasks.    Time 6   Period Weeks   Status New               Plan - 04/20/16 [id number]    Clinical  Impression Statement Performed increased standing ankle stabilization exercises in standing today and patient demonstrates increased fatigue at end of session indicating decreased muscular coordination and muscular endurance. Patient demonstrated significant difficulty with heel raises exercises and required increased weight shifting onto R LE in order to perform without increase in pain. Patient will benefit form further skilled therapy to return to PLOF.    Rehab Potential Fair   Clinical Impairments Affecting Rehab Potential positive: young in age, family support; negative: chronic pain, possible CRPS; patient's clinical presentation is evolving due to pain fluctation and limited mobility;    PT Frequency 3x / week   PT Duration 6 weeks   PT Treatment/Interventions ADLs/Self Care Home Management;Aquatic Therapy;Cryotherapy;Electrical Stimulation;Gait training;DME Instruction;Ultrasound;Moist Heat;Iontophoresis 4mg /ml Dexamethasone;Stair training;Functional mobility training;Therapeutic activities;Therapeutic exercise;Balance training;Neuromuscular re-education;Manual techniques;Patient/family education;Passive range of motion;Dry needling;Energy conservation;Taping   PT Next Visit Plan ROM/strength, manual therapy, stabilization exercise   PT Home Exercise Plan reinforced;    Consulted and Agree with Plan of Care Patient      Patient will benefit from skilled therapeutic intervention in order to improve the following deficits and impairments:  Pain, Decreased mobility, Decreased endurance, Decreased range of motion, Decreased strength, Hypomobility, Impaired flexibility, Difficulty walking, Decreased balance  Visit Diagnosis: Pain in left ankle and joints of left foot  Muscle weakness (generalized)  Difficulty in walking, not elsewhere classified     Problem List Patient Active Problem List   Diagnosis Date Noted  . Hypertension   . Migraine headache   . History of nephrolithiasis    . Allergy     Blythe Stanford, PT DPT 04/20/2016, 10:06 AM  [place] MAIN [facility] SERVICES [address] [place], [place], [address] Phone: [phone]   Fax:  [phone]  Name: Jennifer Castaneda MRN: [id number] Date of Birth: 01-Jul-1965

## 2016-04-24 ENCOUNTER — Encounter: Payer: Self-pay | Admitting: Physical Therapy

## 2016-04-24 ENCOUNTER — Ambulatory Visit: Payer: 59

## 2016-04-24 DIAGNOSIS — M25572 Pain in left ankle and joints of left foot: Secondary | ICD-10-CM

## 2016-04-24 DIAGNOSIS — M6281 Muscle weakness (generalized): Secondary | ICD-10-CM | POA: Diagnosis not present

## 2016-04-24 DIAGNOSIS — R262 Difficulty in walking, not elsewhere classified: Secondary | ICD-10-CM | POA: Diagnosis not present

## 2016-04-24 NOTE — Therapy (Signed)
[place] MAIN [facility] SERVICES [address] [place], [place], [address] Phone: [phone]   Fax:  [phone]  Physical Therapy Treatment  Patient Details  Name: Jennifer Castaneda MRN: [id number] Date of Birth: Jun 17, 1965 Referring Provider: Dr. Sharlotte Alamo  Encounter Date: 04/24/2016      PT End of Session - 04/24/16 1103    Visit Number 9   Number of Visits 19   Date for PT Re-Evaluation 05/09/16   PT Start Time 1057   PT Stop Time 1145   PT Time Calculation (min) 48 min   Equipment Utilized During Treatment Gait belt   Activity Tolerance Patient tolerated treatment well   Behavior During Therapy [facility] for tasks assessed/performed      Past Medical History:  Diagnosis Date  . Allergy   . History of nephrolithiasis   . Hypertension   . Migraine headache     Past Surgical History:  Procedure Laterality Date  . BREAST SURGERY     cyst  . CESAREAN SECTION    . NASAL SINUS SURGERY      There were no vitals filed for this visit.      Subjective Assessment - 04/24/16 1058    Subjective Pt reports continued pain in her L ankle and foot. Pain posterior to both medial and lateral malleolus as well as dorsal surface near met heads of 2nd-5th digits.    Pertinent History personal factors affecting rehab: chronic condition, significant pain, weakness, not able to work, frustration/emotional stress;    Limitations Standing;Walking   How long can you stand comfortably? 10 min   How long can you walk comfortably? <300 feet with B axillary crutches   Diagnostic tests X-rays, possible avulsion fracture   Currently in Pain? Yes   Pain Score 3    Pain Location Foot   Pain Orientation Left   Pain Descriptors / Indicators Burning   Pain Type Chronic pain   Pain Radiating Towards lateral ankle to top of foot   Pain Onset More than a month ago   Pain Frequency Constant   Multiple Pain Sites No       TREATMENT  Manual  therapy Patient in long sitting, all manual therapy to L ankle unless otherwise specified: PROM ankle in all planes with 5s hold; STM to the distal aspect of the achilles tendon extending proximally into gastrocsoleus. STM to peroneus brevis and longus with notable trigger points in proximal muscle belly which radiates pain distally into foot; Grade III distraction mobilizations to talocrural and subtalar joints 30s/bout x 2 bouts each; Grade III AP talocrural mobilizations 30s/bout x 2 bouts at end  Grade II PA/AP mobs along 1-5th metatarsals 3x15 sec each;  Therapeutic Exercise All exercises performed on L ankle unless otherwise specified:  Isometric resisted dorsiflexion, plantarflexion, inversion, and eversion 10s hold x 5 each, painfree; Red tband resisted dorsiflexion, plantarflexion, inversion, and eversion 3s hold x 10 each, painfree; Ankle alphabet x 1 cycle; LLE single leg press 45# x 10, 60# x 10, pain-free throughout; Standing Prostretch calf stretch 30s/bout x 3 bouts, pain-free  All exercises are pain-free on this date. Pt requires intermittent cues to correct form/technique or to engage proper muscles and hold for optimal firing.                        PT Education - 04/24/16 1103    Education provided Yes   Education Details Continue HEP, possible causes for  pain   Person(s) Educated Patient   Methods Explanation   Comprehension Verbalized understanding             PT Long Term Goals - 03/28/16 1816      PT LONG TERM GOAL #1   Title Patient will be independent in home exercise program to improve strength/mobility for better functional independence with ADLs.   Time 6   Period Weeks   Status New     PT LONG TERM GOAL #2   Title Patient will increase 10 meter walk test to >1.24ms as to improve gait speed for better community ambulation and to reduce fall risk.   Time 6   Period Weeks   Status New     PT LONG TERM GOAL #3   Title Patient  will report a worst pain of 3/10 on VAS in  left ankle/foot           to improve tolerance with ADLs and reduced symptoms with activities.    Time 6   Period Weeks   Status New     PT LONG TERM GOAL #4   Title Patient will increase lower extremity functional scale to >60/80 to demonstrate improved functional mobility and increased tolerance with ADLs.    Time 6   Period Weeks   Status New     PT LONG TERM GOAL #5   Title patient will increase LLE ankle ROM: DF: 5 degrees, PF: 60 degrees, IV: 25 degrees, EV: 10 degrees for better functional ROM for standing/walking tasks.    Time 6   Period Weeks   Status New               Plan - 04/24/16 1104    Clinical Impression Statement Pt continues to report L ankle pain, especially near heel. Unable to rate pain at end of session but states she continues to have burning pain in L foot. Pt demonstrates progressively less tenderness to palpation as manual techniques progress. She is particularly tender with palpation at base of 5th metatarsal. Pt able to progress resistance with L single leg press without reported increase in pain. Pt encouraged to continue current HEP and follow-up as scheduled.    Rehab Potential Fair   Clinical Impairments Affecting Rehab Potential positive: young in age, family support; negative: chronic pain, possible CRPS; patient's clinical presentation is evolving due to pain fluctation and limited mobility;    PT Frequency 3x / week   PT Duration 6 weeks   PT Treatment/Interventions ADLs/Self Care Home Management;Aquatic Therapy;Cryotherapy;Electrical Stimulation;Gait training;DME Instruction;Ultrasound;Moist Heat;Iontophoresis 470mml Dexamethasone;Stair training;Functional mobility training;Therapeutic activities;Therapeutic exercise;Balance training;Neuromuscular re-education;Manual techniques;Patient/family education;Passive range of motion;Dry needling;Energy conservation;Taping   PT Next Visit Plan ROM/strength,  manual therapy, stabilization exercise   PT Home Exercise Plan reinforced;    Consulted and Agree with Plan of Care Patient      Patient will benefit from skilled therapeutic intervention in order to improve the following deficits and impairments:  Pain, Decreased mobility, Decreased endurance, Decreased range of motion, Decreased strength, Hypomobility, Impaired flexibility, Difficulty walking, Decreased balance  Visit Diagnosis: Pain in left ankle and joints of left foot  Muscle weakness (generalized)     Problem List Patient Active Problem List   Diagnosis Date Noted  . Hypertension   . Migraine headache   . History of nephrolithiasis   . Allergy    JaPhillips GroutT, DPT   Marsia Cino 04/24/2016, 2:27 PM  [facility] [address]  [place], [place], [address] Phone: [phone]   Fax:  [phone]  Name: Jennifer Castaneda MRN: [id number] Date of Birth: 1965-05-15

## 2016-04-26 ENCOUNTER — Ambulatory Visit: Payer: 59 | Admitting: Physical Therapy

## 2016-04-26 ENCOUNTER — Encounter: Payer: Self-pay | Admitting: Physical Therapy

## 2016-04-26 DIAGNOSIS — M6281 Muscle weakness (generalized): Secondary | ICD-10-CM

## 2016-04-26 DIAGNOSIS — M25572 Pain in left ankle and joints of left foot: Secondary | ICD-10-CM | POA: Diagnosis not present

## 2016-04-26 DIAGNOSIS — R262 Difficulty in walking, not elsewhere classified: Secondary | ICD-10-CM | POA: Diagnosis not present

## 2016-04-26 NOTE — Therapy (Signed)
[place] MAIN [facility] SERVICES [address] [place], [place], [address] Phone: [phone]   Fax:  [phone]  Physical Therapy Treatment  Patient Details  Name: Jennifer Castaneda MRN: [id number] Date of Birth: 1965-09-29 Referring Provider: Dr. Sharlotte Alamo  Encounter Date: 04/26/2016      PT End of Session - 04/26/16 1407    Visit Number 10   Number of Visits 19   Date for PT Re-Evaluation 05/09/16   PT Start Time 1100   PT Stop Time 1145   PT Time Calculation (min) 45 min   Equipment Utilized During Treatment Gait belt   Activity Tolerance Patient tolerated treatment well;Patient limited by pain   Behavior During Therapy [facility] for tasks assessed/performed      Past Medical History:  Diagnosis Date  . Allergy   . History of nephrolithiasis   . Hypertension   . Migraine headache     Past Surgical History:  Procedure Laterality Date  . BREAST SURGERY     cyst  . CESAREAN SECTION    . NASAL SINUS SURGERY      There were no vitals filed for this visit.      Subjective Assessment - 04/26/16 1109    Subjective Patient reports wearing her shoe on left foot more and more. She reports being able to wear her shoe about 6 hours at one time; She reports that this is not consistent as she did wear her boot all day yesterday due to increased pain; She reports when her left foot is hurting and she puts the boot on it does help relieve discomfort; She presesnts to therapy with BLE tennis shoes;    Pertinent History personal factors affecting rehab: chronic condition, significant pain, weakness, not able to work, frustration/emotional stress;    Limitations Standing;Walking   How long can you stand comfortably? 10 min   How long can you walk comfortably? <300 feet with B axillary crutches   Diagnostic tests X-rays, possible avulsion fracture   Currently in Pain? Yes   Pain Score 3    Pain Location Ankle   Pain Orientation Left   Pain Descriptors / Indicators Burning;Sore   Pain Type Chronic pain   Pain Radiating Towards LLE lateral ankle to top of foot;    Pain Onset More than a month ago      TREATMENT: Warm up on recumbent bike x4 min (unbilled);  Standing: LLE BAPs board level 3: DF/PF, IV/EV, CW/CCW x10 each with RLE assisting with weight shift as needed, BUE rail assist;  Leg press: LLE only 60# 2x12 with min Vcs to slow down eccentric return for better quad strengthening; LLE only 60# heel raises 2x12 with min Vcs to keep knee straight for ankle isolation;  Patient reports having trouble descending stairs reciprocally; Instructed patient to advance LLE forward so that toes are off step and then step down with right foot to compensate with less ankle DF required for descending stairs; Patient able to do with less discomfort;        [facility] PT Assessment - 04/26/16 0001      Circumferential Edema   Circumferential - Left  24     Figure 8 Edema   Figure 8 - Left  50.5     Improved girth measurements as compared to eval: LLE: circumferential: 26 cm, figure 8: 53 cm    Manual therapy: PT performed cross friction and ASTYM with edge tool along left plantar fascia with patient in  passive ankle DF with toe extension to increase tension on plantar fascia x8 min; Patient reports increased discomfort along left heel and plantar surface of foot; She demonstrates increased tightness in plantar fascia; Educated patient in cross friction massage to do at home which she verbalized and demonstrated understanding; Patient reports less discomfort following manual therapy;  PT performed PROM of LLE: passive circles clockwise/counterclockwise x10 each, passive toe extension x10 reps; Patient educated in importance of increasing stretch along toes particularly 5th toe; Patient able to abduct and adduct toes without discomfort;  Grade II-III PA;AP mobs to left talocrual joint 10 sec bouts x4 each;  Patient reports  less stiffness after treatment session; She verbalizes understanding in HEP;                        PT Education - 04/26/16 1407    Education provided Yes   Education Details reinforced HEP, strengthening/manual therapy;    Person(s) Educated Patient   Methods Explanation;Verbal cues   Comprehension Verbalized understanding;Returned demonstration;Verbal cues required             PT Long Term Goals - 03/28/16 1816      PT LONG TERM GOAL #1   Title Patient will be independent in home exercise program to improve strength/mobility for better functional independence with ADLs.   Time 6   Period Weeks   Status New     PT LONG TERM GOAL #2   Title Patient will increase 10 meter walk test to >1.56ms as to improve gait speed for better community ambulation and to reduce fall risk.   Time 6   Period Weeks   Status New     PT LONG TERM GOAL #3   Title Patient will report a worst pain of 3/10 on VAS in  left ankle/foot           to improve tolerance with ADLs and reduced symptoms with activities.    Time 6   Period Weeks   Status New     PT LONG TERM GOAL #4   Title Patient will increase lower extremity functional scale to >60/80 to demonstrate improved functional mobility and increased tolerance with ADLs.    Time 6   Period Weeks   Status New     PT LONG TERM GOAL #5   Title patient will increase LLE ankle ROM: DF: 5 degrees, PF: 60 degrees, IV: 25 degrees, EV: 10 degrees for better functional ROM for standing/walking tasks.    Time 6   Period Weeks   Status New               Plan - 04/26/16 1408    Clinical Impression Statement Patient continues to have discomfort along LLE foot along 5th met head. Patient presents to therapy wearing tennis shoes with BLE and using 1 crutch. She is able to walk short distances in tennis shoes without crutch. Patient able to progress strengthening with increased resistance on leg press. She exhibits less swelling  with girth measurements as compared to previous sessions. Patient responded well to manual therapy; She continues to have stiffness along 5th distal metatarsal head but was able to demonstrate improved mobility along 5th metatarsal. She would benefit from additional skilled PT intervention to improve ankle ROM/strength and reduce pain with ADLs;    Rehab Potential Fair   Clinical Impairments Affecting Rehab Potential positive: young in age, family support; negative: chronic pain, possible CRPS; patient's clinical presentation is evolving  due to pain fluctation and limited mobility;    PT Frequency 3x / week   PT Duration 6 weeks   PT Treatment/Interventions ADLs/Self Care Home Management;Aquatic Therapy;Cryotherapy;Electrical Stimulation;Gait training;DME Instruction;Ultrasound;Moist Heat;Iontophoresis 75m/ml Dexamethasone;Stair training;Functional mobility training;Therapeutic activities;Therapeutic exercise;Balance training;Neuromuscular re-education;Manual techniques;Patient/family education;Passive range of motion;Dry needling;Energy conservation;Taping   PT Next Visit Plan ROM/strength, manual therapy, stabilization exercise   PT Home Exercise Plan reinforced;    Consulted and Agree with Plan of Care Patient      Patient will benefit from skilled therapeutic intervention in order to improve the following deficits and impairments:  Pain, Decreased mobility, Decreased endurance, Decreased range of motion, Decreased strength, Hypomobility, Impaired flexibility, Difficulty walking, Decreased balance  Visit Diagnosis: Pain in left ankle and joints of left foot  Muscle weakness (generalized)  Difficulty in walking, not elsewhere classified     Problem List Patient Active Problem List   Diagnosis Date Noted  . Hypertension   . Migraine headache   . History of nephrolithiasis   . Allergy     Drakkar Medeiros PT, DPT 04/26/2016, 2:30 PM  [place] [facility] [facility] [address]RG[address] [place] [address]Phone: [phone]  Fax:  [phone] Name: JLIVIAH CAKEMRN: [id number] of Birth: [birth year]

## 2016-04-27 ENCOUNTER — Ambulatory Visit: Payer: 59

## 2016-04-27 DIAGNOSIS — M6281 Muscle weakness (generalized): Secondary | ICD-10-CM | POA: Diagnosis not present

## 2016-04-27 DIAGNOSIS — R262 Difficulty in walking, not elsewhere classified: Secondary | ICD-10-CM

## 2016-04-27 DIAGNOSIS — M25572 Pain in left ankle and joints of left foot: Secondary | ICD-10-CM | POA: Diagnosis not present

## 2016-04-27 NOTE — Therapy (Signed)
[place] MAIN [facility] SERVICES [address] [place], [place], [address] Phone: [phone]   Fax:  [phone]  Physical Therapy Treatment  Patient Details  Name: Jennifer Castaneda MRN: [id number] Date of Birth: [birth year] Referring Provider: Dr. Sharlotte Alamo  Encounter Date: 04/27/2016      PT End of Session - 04/27/16 0911    Visit Number 11   Number of Visits 19   Date for PT Re-Evaluation 05/09/16   PT Start Time 0903   PT Stop Time 0930   PT Time Calculation (min) 27 min   Equipment Utilized During Treatment Gait belt   Activity Tolerance Patient tolerated treatment well;Patient limited by pain   Behavior During Therapy [facility] for tasks assessed/performed      Past Medical History:  Diagnosis Date  . Allergy   . History of nephrolithiasis   . Hypertension   . Migraine headache     Past Surgical History:  Procedure Laterality Date  . BREAST SURGERY     cyst  . CESAREAN SECTION    . NASAL SINUS SURGERY      There were no vitals filed for this visit.      Subjective Assessment - 04/27/16 0908    Subjective Patient reports the pain during the past Wednesday. States her heel hurts the most out of all the problems in her ankle.    Pertinent History personal factors affecting rehab: chronic condition, significant pain, weakness, not able to work, frustration/emotional stress;    Limitations Standing;Walking   How long can you stand comfortably? 10 min   How long can you walk comfortably? <300 feet with B axillary crutches   Diagnostic tests X-rays, possible avulsion fracture   Currently in Pain? Yes   Pain Score 3    Pain Location Ankle   Pain Orientation Left   Pain Descriptors / Indicators Burning   Pain Type Chronic pain   Pain Onset More than a month ago      Therapeutic Exercise: Marching on airex - 2 x 20  Side stepping up and over - 2 x 10  Heel raises in standing on airex pad -  x 20  Single leg stance  with finger tip support - 3 x 30sec  Stepping lunges onto airex pad (performed throughout decreased motion secondary to pain) - x 15  Pre gait widened tandem stance with anterior/posterior weight shifts - x20 B Step ups with high march on follow-through leg - x20 B       PT Education - 04/27/16 0911    Education provided Yes   Education Details Joint posiitoning and technique throghout session   Person(s) Educated Patient   Methods Explanation;Demonstration   Comprehension Verbalized understanding;Returned demonstration             PT Long Term Goals - 03/28/16 1816      PT LONG TERM GOAL #1   Title Patient will be independent in home exercise program to improve strength/mobility for better functional independence with ADLs.   Time 6   Period Weeks   Status New     PT LONG TERM GOAL #2   Title Patient will increase 10 meter walk test to >1.79ms as to improve gait speed for better community ambulation and to reduce fall risk.   Time 6   Period Weeks   Status New     PT LONG TERM GOAL #3   Title Patient will report a worst pain of 3/10 on VAS  in  left ankle/foot           to improve tolerance with ADLs and reduced symptoms with activities.    Time 6   Period Weeks   Status New     PT LONG TERM GOAL #4   Title Patient will increase lower extremity functional scale to >60/80 to demonstrate improved functional mobility and increased tolerance with ADLs.    Time 6   Period Weeks   Status New     PT LONG TERM GOAL #5   Title patient will increase LLE ankle ROM: DF: 5 degrees, PF: 60 degrees, IV: 25 degrees, EV: 10 degrees for better functional ROM for standing/walking tasks.    Time 6   Period Weeks   Status New               Plan - 04/27/16 1093    Clinical Impression Statement Patient demonstrates decreased standing tolerance with increased soreness along the mid arch and the 5th met head on the L LE. Focused on performing exercises in the sagittal plane to  improve gait pattern/walking ability. Patient will benefit from further skilled therapy focused on improving ankle stabilization and manual therapy to decrease pain.    Rehab Potential Fair   Clinical Impairments Affecting Rehab Potential positive: young in age, family support; negative: chronic pain, possible CRPS; patient's clinical presentation is evolving due to pain fluctation and limited mobility;    PT Frequency 3x / week   PT Duration 6 weeks   PT Treatment/Interventions ADLs/Self Care Home Management;Aquatic Therapy;Cryotherapy;Electrical Stimulation;Gait training;DME Instruction;Ultrasound;Moist Heat;Iontophoresis 60m/ml Dexamethasone;Stair training;Functional mobility training;Therapeutic activities;Therapeutic exercise;Balance training;Neuromuscular re-education;Manual techniques;Patient/family education;Passive range of motion;Dry needling;Energy conservation;Taping   PT Next Visit Plan ROM/strength, manual therapy, stabilization exercise   PT Home Exercise Plan reinforced;    Consulted and Agree with Plan of Care Patient      Patient will benefit from skilled therapeutic intervention in order to improve the following deficits and impairments:  Pain, Decreased mobility, Decreased endurance, Decreased range of motion, Decreased strength, Hypomobility, Impaired flexibility, Difficulty walking, Decreased balance  Visit Diagnosis: Muscle weakness (generalized)  Pain in left ankle and joints of left foot  Difficulty in walking, not elsewhere classified     Problem List Patient Active Problem List   Diagnosis Date Noted  . Hypertension   . Migraine headache   . History of nephrolithiasis   . Allergy     WBlythe Stanford1/03/2017, 9:34 AM  [place] RA[facility] Surgery CenterSERVICES [address] [address]Phone: [phone]  Fax:  [phone] Name: Jennifer BRYANDMRN: [id number] of Birth: [birth year]

## 2016-05-01 ENCOUNTER — Ambulatory Visit: Payer: 59

## 2016-05-03 ENCOUNTER — Ambulatory Visit: Payer: 59 | Admitting: Physical Therapy

## 2016-05-04 ENCOUNTER — Ambulatory Visit: Payer: 59

## 2016-05-04 DIAGNOSIS — R262 Difficulty in walking, not elsewhere classified: Secondary | ICD-10-CM | POA: Diagnosis not present

## 2016-05-04 DIAGNOSIS — M25572 Pain in left ankle and joints of left foot: Secondary | ICD-10-CM | POA: Diagnosis not present

## 2016-05-04 DIAGNOSIS — M6281 Muscle weakness (generalized): Secondary | ICD-10-CM

## 2016-05-04 NOTE — Therapy (Signed)
[place] MAIN [facility] SERVICES [address] [place], [place], [address] Phone: [phone]   Fax:  [phone]  Physical Therapy Treatment  Patient Details  Name: Jennifer Castaneda MRN: [id number] Date of Birth: [birth year] Referring Provider: Dr. Sharlotte Alamo  Encounter Date: 05/04/2016      PT End of Session - 05/04/16 1020    Visit Number 12   Number of Visits 19   Date for PT Re-Evaluation 05/09/16   PT Start Time 0920   PT Stop Time 1008   PT Time Calculation (min) 48 min   Equipment Utilized During Treatment Gait belt   Activity Tolerance Patient tolerated treatment well;Patient limited by pain   Behavior During Therapy [facility] for tasks assessed/performed      Past Medical History:  Diagnosis Date  . Allergy   . History of nephrolithiasis   . Hypertension   . Migraine headache     Past Surgical History:  Procedure Laterality Date  . BREAST SURGERY     cyst  . CESAREAN SECTION    . NASAL SINUS SURGERY      There were no vitals filed for this visit.      Subjective Assessment - 05/04/16 0948    Subjective Patient reports her foot pain continues to cause her pain but is overall improving. Patient states she's able to perform exercises with less pain than previously.    Pertinent History personal factors affecting rehab: chronic condition, significant pain, weakness, not able to work, frustration/emotional stress;    Limitations Standing;Walking   How long can you stand comfortably? 10 min   How long can you walk comfortably? <300 feet with B axillary crutches   Diagnostic tests X-rays, possible avulsion fracture   Currently in Pain? Yes   Pain Score 2    Pain Location Ankle   Pain Orientation Left   Pain Descriptors / Indicators Aching   Pain Type Chronic pain   Pain Onset More than a month ago      Manual Therapy: STM to plantar fascia and Achilles area of the L ankle to decrease pain in spasms in the  foot/ankle. Performed mobilizations to the a->p TC, medial/lateral subtalar, and TC distraction grade IV for all movements. Intermetatarsal mobilizations on all digits on L foot to improve mobility and decrease pain.   Therapeutic Exercise: Marching on Bosu - 2 x 20  Tandem stance on flat ground - 2 x 1 min Side stepping up and over airex  -  x 10  Heel raises in standing on flat ground -  2 x 20  Single leg stance rotations on L LE only - 3 x 10 Stepping lunges onto bosu ball from airex (performed throughout decreased motion secondary to pain) - x 15 B Pre gait widened tandem stance with anterior/posterior weight shifts - 2x 15 B BAPS board level 3 - single leg support forward/backward; laterally x20 Rotational step ups onto airex pad - x 10  *Patient required intermittent UE support to perform all balancing exercises      PT Education - 05/04/16 1019    Education provided Yes   Education Details HEP: rolling plantar fascia with water bottle    Person(s) Educated Patient   Methods Explanation   Comprehension Verbalized understanding             PT Long Term Goals - 03/28/16 1816      PT LONG TERM GOAL #1   Title Patient will be independent in  home exercise program to improve strength/mobility for better functional independence with ADLs.   Time 6   Period Weeks   Status New     PT LONG TERM GOAL #2   Title Patient will increase 10 meter walk test to >1.15m/s as to improve gait speed for better community ambulation and to reduce fall risk.   Time 6   Period Weeks   Status New     PT LONG TERM GOAL #3   Title Patient will report a worst pain of 3/10 on VAS in  left ankle/foot           to improve tolerance with ADLs and reduced symptoms with activities.    Time 6   Period Weeks   Status New     PT LONG TERM GOAL #4   Title Patient will increase lower extremity functional scale to >60/80 to demonstrate improved functional mobility and increased tolerance with ADLs.     Time 6   Period Weeks   Status New     PT LONG TERM GOAL #5   Title patient will increase LLE ankle ROM: DF: 5 degrees, PF: 60 degrees, IV: 25 degrees, EV: 10 degrees for better functional ROM for standing/walking tasks.    Time 6   Period Weeks   Status New               Plan - 05/04/16 1020    Clinical Impression Statement Patient demonstrates improved standing tolerance and decreased pain with exercise performance indicating functional improvement with exercise. Educated patient about trialing use of cane for stability next visit and patient will benefit from further skilled therapy to return to prior level of function.    Rehab Potential Fair   Clinical Impairments Affecting Rehab Potential positive: young in age, family support; negative: chronic pain, possible CRPS; patient's clinical presentation is evolving due to pain fluctation and limited mobility;    PT Frequency 3x / week   PT Duration 6 weeks   PT Treatment/Interventions ADLs/Self Care Home Management;Aquatic Therapy;Cryotherapy;Electrical Stimulation;Gait training;DME Instruction;Ultrasound;Moist Heat;Iontophoresis 4mg /ml Dexamethasone;Stair training;Functional mobility training;Therapeutic activities;Therapeutic exercise;Balance training;Neuromuscular re-education;Manual techniques;Patient/family education;Passive range of motion;Dry needling;Energy conservation;Taping   PT Next Visit Plan ROM/strength, manual therapy, stabilization exercise   PT Home Exercise Plan reinforced;    Consulted and Agree with Plan of Care Patient      Patient will benefit from skilled therapeutic intervention in order to improve the following deficits and impairments:  Pain, Decreased mobility, Decreased endurance, Decreased range of motion, Decreased strength, Hypomobility, Impaired flexibility, Difficulty walking, Decreased balance  Visit Diagnosis: Muscle weakness (generalized)  Pain in left ankle and joints of left  foot  Difficulty in walking, not elsewhere classified     Problem List Patient Active Problem List   Diagnosis Date Noted  . Hypertension   . Migraine headache   . History of nephrolithiasis   . Allergy     Blythe Stanford, PT DPT 05/04/2016, 10:25 AM  [place] MAIN [facility] SERVICES [address] [place], [place], [address] Phone: [phone]   Fax:  [phone]  Name: Jennifer Castaneda MRN: [id number] Date of Birth: 22-Mar-1966

## 2016-05-08 ENCOUNTER — Encounter: Payer: Self-pay | Admitting: Physical Therapy

## 2016-05-08 ENCOUNTER — Ambulatory Visit: Payer: 59 | Admitting: Physical Therapy

## 2016-05-08 DIAGNOSIS — R262 Difficulty in walking, not elsewhere classified: Secondary | ICD-10-CM | POA: Diagnosis not present

## 2016-05-08 DIAGNOSIS — M6281 Muscle weakness (generalized): Secondary | ICD-10-CM | POA: Diagnosis not present

## 2016-05-08 DIAGNOSIS — M25572 Pain in left ankle and joints of left foot: Secondary | ICD-10-CM

## 2016-05-08 NOTE — Therapy (Signed)
[place] MAIN [facility] SERVICES [address] [place], [place], [address] Phone: [phone]   Fax:  [phone]  Physical Therapy Treatment/Progress Note  Patient Details  Name: Jennifer Castaneda MRN: [id number] Date of Birth: Sep 23, 1965 Referring Provider: Dr. Sharlotte Alamo  Encounter Date: 05/08/2016      PT End of Session - 05/08/16 1518    Visit Number 13   Number of Visits 27   Date for PT Re-Evaluation 06/05/16   PT Start Time 1515   PT Stop Time 1600   PT Time Calculation (min) 45 min   Equipment Utilized During Treatment Gait belt   Activity Tolerance Patient tolerated treatment well;Patient limited by pain   Behavior During Therapy [facility] for tasks assessed/performed      Past Medical History:  Diagnosis Date  . Allergy   . History of nephrolithiasis   . Hypertension   . Migraine headache     Past Surgical History:  Procedure Laterality Date  . BREAST SURGERY     cyst  . CESAREAN SECTION    . NASAL SINUS SURGERY      There were no vitals filed for this visit.      Subjective Assessment - 05/08/16 1521    Subjective Patient reports continued RLE ankle and foot pain that can be severe. She reports wearing the boot still a few hours each day. she presents to therapy with tennis shoes with 1 crutch; She reports using the 1 crutch most of the time when out of the house. She can walk some without the crutch in the house if not hurting too bad;    Pertinent History personal factors affecting rehab: chronic condition, significant pain, weakness, not able to work, frustration/emotional stress;    Limitations Standing;Walking   How long can you stand comfortably? 10 min   How long can you walk comfortably? <300 feet with B axillary crutches   Diagnostic tests X-rays, possible avulsion fracture   Currently in Pain? Yes   Pain Score 7    Pain Location Ankle   Pain Orientation Left   Pain Descriptors / Indicators Sharp   Pain Type Chronic pain   Pain Onset More than a month ago            [facility] PT Assessment - 05/08/16 0001      Observation/Other Assessments   Lower Extremity Functional Scale  36/80 (the lower the score the greater the disability) improved from 03/28/16 which was 25/80     Circumferential Edema   Circumferential - Left  25     Figure 8 Edema   Figure 8 - Left  49.5     AROM   Left Ankle Dorsiflexion 8   Left Ankle Plantar Flexion 65   Left Ankle Inversion 32   Left Ankle Eversion 16     Standardized Balance Assessment   Five times sit to stand comments  15 sec without HHA (<60 yo, >10 sec indicates low fall risk)   10 Meter Walk 0.71 (with 1 crutch), 0.76 (without AD) (limited home ambulator; improved from 03/28/16 which was 0.66 m/s)      TREATMENT: PT instructed patient in outcome measures to assess progress towards goals; see above; she required min VCs for correct activity including to increase forward lean with sit<>Stand for better transfer ability;  Overall patient demonstrates significant improvement in LLE ankle ROM as compared to initial eval;  Strength: R: ankle: 5/5 L: ankle DF: 3/5, PF: 3/5, IV: 3+/5,  EV: 3-/5  She continues to have weakness in LLE ankle as compared to RLE;  Advanced HEP: Green tband ankle: DF, PF, EV/IV x10 each with tactile cues to isolate ankle ROM for better ankle strengthening; provided green band for HEP to improve strengthening;  Patient reports continued sharp pain along left lateral foot;  PT performed US, continuous, 1.8 watts/centimeter squared x8 min; patient tolerated well with feeling warmth with [place] and reporting less pain to 5/10 after treatment session;                        PT Education - 05/08/16 1523    Education provided Yes   Education Details HEP reinforced, progress towards goals; recommendations;    Person(s) Educated Patient   Methods Explanation;Verbal cues   Comprehension Verbalized  understanding;Returned demonstration;Verbal cues required             PT Long Term Goals - 05/08/16 1525      PT LONG TERM GOAL #1   Title Patient will be independent in home exercise program to improve strength/mobility for better functional independence with ADLs.   Time 4   Period Weeks   Status On-going     PT LONG TERM GOAL #2   Title Patient will increase 10 meter walk test to >1.22ms as to improve gait speed for better community ambulation and to reduce fall risk.   Time 4   Period Weeks   Status Partially Met     PT LONG TERM GOAL #3   Title Patient will report a worst pain of 3/10 on VAS in  left ankle/foot           to improve tolerance with ADLs and reduced symptoms with activities.    Baseline 9/10   Time 4   Period Weeks   Status Not Met     PT LONG TERM GOAL #4   Title Patient will increase lower extremity functional scale to >60/80 to demonstrate improved functional mobility and increased tolerance with ADLs.    Time 4   Period Weeks   Status Partially Met     PT LONG TERM GOAL #5   Title patient will increase LLE ankle ROM: DF: 5 degrees, PF: 60 degrees, IV: 25 degrees, EV: 10 degrees for better functional ROM for standing/walking tasks.    Time 4   Period Weeks   Status Achieved               Plan - 05/08/16 1623    Clinical Impression Statement Patient instructed in outcome measures to assess progress towards goals; She demonstrates significant improvement in LLE ankle ROM; She has improvement in gait speed, sit<>stand ability and exhibits less swelling in LLE ankle. However patient continues to have slower gait speed than that of a community ambulator. Patient had increased LLE lateral foot pain today; PT performed UKoreacontinuous 1.8 watts per centimeter squared x8 min to reduce discomfort; Patient responded well with less pain and feeling better flexibility following session; She would benefit from additional skilled PT intervention to improve  strength, ROM and reduce LLE ankle pain for return to ADLs;    Rehab Potential Fair   Clinical Impairments Affecting Rehab Potential positive: young in age, family support; negative: chronic pain, possible CRPS; patient's clinical presentation is evolving due to pain fluctation and limited mobility;    PT Frequency 2x / week   PT Duration 4 weeks   PT Treatment/Interventions ADLs/Self Care Home Management;Aquatic Therapy;Cryotherapy;EDealer  Stimulation;Gait training;DME Instruction;Ultrasound;Moist Heat;Iontophoresis 62m/ml Dexamethasone;Stair training;Functional mobility training;Therapeutic activities;Therapeutic exercise;Balance training;Neuromuscular re-education;Manual techniques;Patient/family education;Passive range of motion;Dry needling;Energy conservation;Taping   PT Next Visit Plan ROM/strength, manual therapy, stabilization exercise   PT Home Exercise Plan reinforced;    Consulted and Agree with Plan of Care Patient      Patient will benefit from skilled therapeutic intervention in order to improve the following deficits and impairments:  Pain, Decreased mobility, Decreased endurance, Decreased range of motion, Decreased strength, Hypomobility, Impaired flexibility, Difficulty walking, Decreased balance  Visit Diagnosis: Muscle weakness (generalized) - Plan: PT plan of care cert/re-cert  Pain in left ankle and joints of left foot - Plan: PT plan of care cert/re-cert  Difficulty in walking, not elsewhere classified - Plan: PT plan of care cert/re-cert     Problem List Patient Active Problem List   Diagnosis Date Noted  . Hypertension   . Migraine headache   . History of nephrolithiasis   . Allergy     Summerlyn Fickel PT, DPT 05/08/2016, 4:27 PM  [place] [facility] [address]RPawcat[place] [address]Phone: [phone]  Fax:  [phone] Name: Jennifer WHITSONMRN: [id number] of Birth:  [birth year]

## 2016-05-10 ENCOUNTER — Encounter: Payer: Self-pay | Admitting: Physical Therapy

## 2016-05-10 ENCOUNTER — Ambulatory Visit: Payer: 59 | Admitting: Physical Therapy

## 2016-05-10 DIAGNOSIS — R262 Difficulty in walking, not elsewhere classified: Secondary | ICD-10-CM

## 2016-05-10 DIAGNOSIS — M6281 Muscle weakness (generalized): Secondary | ICD-10-CM | POA: Diagnosis not present

## 2016-05-10 DIAGNOSIS — M25572 Pain in left ankle and joints of left foot: Secondary | ICD-10-CM | POA: Diagnosis not present

## 2016-05-10 NOTE — Therapy (Signed)
[place] MAIN [facility] SERVICES [address] [place], [place], [address] Phone: [phone]   Fax:  [phone]  Physical Therapy Treatment  Patient Details  Name: Jennifer Castaneda MRN: [id number] Date of Birth: 1965/07/31 Referring Provider: Dr. Sharlotte Alamo  Encounter Date: 05/10/2016      PT End of Session - 05/10/16 1116    Visit Number 14   Number of Visits 27   Date for PT Re-Evaluation 06/05/16   PT Start Time 1112   PT Stop Time 1155   PT Time Calculation (min) 43 min   Equipment Utilized During Treatment Gait belt   Activity Tolerance Patient tolerated treatment well;Patient limited by pain   Behavior During Therapy [facility] for tasks assessed/performed      Past Medical History:  Diagnosis Date  . Allergy   . History of nephrolithiasis   . Hypertension   . Migraine headache     Past Surgical History:  Procedure Laterality Date  . BREAST SURGERY     cyst  . CESAREAN SECTION    . NASAL SINUS SURGERY      There were no vitals filed for this visit.      Subjective Assessment - 05/10/16 1115    Subjective Patient reports doing better today as compared last session; she reports trying the green tband yesterday for resistance which went well with HEP;    Pertinent History personal factors affecting rehab: chronic condition, significant pain, weakness, not able to work, frustration/emotional stress;    Limitations Standing;Walking   How long can you stand comfortably? 10 min   How long can you walk comfortably? <300 feet with B axillary crutches   Diagnostic tests X-rays, possible avulsion fracture   Currently in Pain? Yes   Pain Score 3    Pain Location Foot   Pain Orientation Left;Lateral   Pain Descriptors / Indicators Aching;Sore   Pain Type Chronic pain   Pain Onset More than a month ago        TREATMENT;  Warm up on recumbent bike x4 min (Unbilled):  Leg press: LLE only 60# 2x15 with cues to slow  down LE movement for better strengthening; LLE only heel raises 60# 2x10 with cues to relax into ankle DF between reps for better ankle flexibility;   Standing on dynadisc: Weight shift side/side x10; Heel/toe raises x15; Left foot on yellow dynadisc: Forward lunges x10; Step up on LLE with RLE toe tap on 4 inch step x15; Patient denies any increase in pain in left ankle with dyna disc activity; She does require cues to improve stance control while standing on uneven surface with better ankle strategies;   Educated patient in forward/backward weight shifting in walking stance to facilitate better ankle DF/PF especially ankle DF on LLE with forward weight shift x10 each; Patient did require cues to avoid LLE knee flexion with forward weight shift to increase ankle ROM;    PT assessed LLE peroneals and extensor digitorum; She does demonstrate increased trigger points along muscle belly;   PT performed US, continuous, 1.8 watts/centimeter squared x10 min to left lateral and medial ankle ; patient tolerated well with feeling warmth with [place] and reporting less pain after treatment session;                          PT Education - 05/10/16 1116    Education provided Yes   Education Details HEP reinforced; strengthening/exercise technique;  Person(s) Educated Patient   Methods Explanation;Verbal cues   Comprehension Verbalized understanding;Returned demonstration;Verbal cues required             PT Long Term Goals - 05/08/16 1525      PT LONG TERM GOAL #1   Title Patient will be independent in home exercise program to improve strength/mobility for better functional independence with ADLs.   Time 4   Period Weeks   Status On-going     PT LONG TERM GOAL #2   Title Patient will increase 10 meter walk test to >1.75ms as to improve gait speed for better community ambulation and to reduce fall risk.   Time 4   Period Weeks   Status Partially Met     PT LONG TERM  GOAL #3   Title Patient will report a worst pain of 3/10 on VAS in  left ankle/foot           to improve tolerance with ADLs and reduced symptoms with activities.    Baseline 9/10   Time 4   Period Weeks   Status Not Met     PT LONG TERM GOAL #4   Title Patient will increase lower extremity functional scale to >60/80 to demonstrate improved functional mobility and increased tolerance with ADLs.    Time 4   Period Weeks   Status Partially Met     PT LONG TERM GOAL #5   Title patient will increase LLE ankle ROM: DF: 5 degrees, PF: 60 degrees, IV: 25 degrees, EV: 10 degrees for better functional ROM for standing/walking tasks.    Time 4   Period Weeks   Status Achieved               Plan - 05/10/16 1256    Clinical Impression Statement Patient instructed in LLE ankle strengthening and stabilization exercise. Patient required min Vcs for correct exercise technique. She responded well to cues with better ankle ROM and positioning; Patient responded well to UKoreawith less discomfort at end of session; Patient would benefit from additional skilled PT intervention to improve ankle ROM and reduce pain with ADLs;    Rehab Potential Fair   Clinical Impairments Affecting Rehab Potential positive: young in age, family support; negative: chronic pain, possible CRPS; patient's clinical presentation is evolving due to pain fluctation and limited mobility;    PT Frequency 2x / week   PT Duration 4 weeks   PT Treatment/Interventions ADLs/Self Care Home Management;Aquatic Therapy;Cryotherapy;Electrical Stimulation;Gait training;DME Instruction;Ultrasound;Moist Heat;Iontophoresis 432mml Dexamethasone;Stair training;Functional mobility training;Therapeutic activities;Therapeutic exercise;Balance training;Neuromuscular re-education;Manual techniques;Patient/family education;Passive range of motion;Dry needling;Energy conservation;Taping   PT Next Visit Plan ROM/strength, manual therapy, stabilization  exercise   PT Home Exercise Plan reinforced;    Consulted and Agree with Plan of Care Patient      Patient will benefit from skilled therapeutic intervention in order to improve the following deficits and impairments:  Pain, Decreased mobility, Decreased endurance, Decreased range of motion, Decreased strength, Hypomobility, Impaired flexibility, Difficulty walking, Decreased balance  Visit Diagnosis: Muscle weakness (generalized)  Pain in left ankle and joints of left foot  Difficulty in walking, not elsewhere classified     Problem List Patient Active Problem List   Diagnosis Date Noted  . Hypertension   . Migraine headache   . History of nephrolithiasis   . Allergy     Joushua Dugar PT, DPT 05/10/2016, 1:47 PM  [place] [facility] CenterERV[facility] [address] [place]hone: [phone]  Fax:  [phone]  Name: Jennifer Castaneda MRN: [id number] Date of Birth: 03/27/1966

## 2016-05-11 ENCOUNTER — Ambulatory Visit: Payer: 59

## 2016-05-11 DIAGNOSIS — M6281 Muscle weakness (generalized): Secondary | ICD-10-CM | POA: Diagnosis not present

## 2016-05-11 DIAGNOSIS — M25572 Pain in left ankle and joints of left foot: Secondary | ICD-10-CM | POA: Diagnosis not present

## 2016-05-11 DIAGNOSIS — R262 Difficulty in walking, not elsewhere classified: Secondary | ICD-10-CM

## 2016-05-11 NOTE — Therapy (Signed)
[place] MAIN [facility] SERVICES [address] [place], [place], [address] Phone: [phone]   Fax:  [phone]  Physical Therapy Treatment  Patient Details  Name: Jennifer Castaneda MRN: [id number] Date of Birth: Apr 02, 1966 Referring Provider: Dr. Sharlotte Alamo  Encounter Date: 05/11/2016      PT End of Session - 05/11/16 1020    Visit Number 15   Number of Visits 27   Date for PT Re-Evaluation 06/05/16   PT Start Time 1018   PT Stop Time 1100   PT Time Calculation (min) 42 min   Equipment Utilized During Treatment Gait belt   Activity Tolerance Patient tolerated treatment well;Patient limited by pain   Behavior During Therapy [facility] for tasks assessed/performed      Past Medical History:  Diagnosis Date  . Allergy   . History of nephrolithiasis   . Hypertension   . Migraine headache     Past Surgical History:  Procedure Laterality Date  . BREAST SURGERY     cyst  . CESAREAN SECTION    . NASAL SINUS SURGERY      There were no vitals filed for this visit.      Subjective Assessment - 05/11/16 0937    Subjective Patient reports reports the ankle has been feeling "alright" and states her ankle has been hurting last night.   Pertinent History personal factors affecting rehab: chronic condition, significant pain, weakness, not able to work, frustration/emotional stress;    Limitations Standing;Walking   How long can you stand comfortably? 10 min   How long can you walk comfortably? <300 feet with B axillary crutches   Diagnostic tests X-rays, possible avulsion fracture   Currently in Pain? Yes   Pain Score 3    Pain Location Foot   Pain Orientation Left;Lateral   Pain Descriptors / Indicators Aching;Sore   Pain Type Chronic pain   Pain Onset More than a month ago   Pain Frequency Constant      Manual Therapy: STM to plantar fascia and Achilles area of the L ankle to decrease pain in spasms in the foot/ankle..  Intermetatarsal mobilizations on all digits on L foot to improve mobility and decrease pain. Talar-metatarsal mobilizations grade I-II along digits 4-5 to decrease pain and spasms.    Therapeutic Exercise: Performed stationary bike for warm up - 5 min Mirror Therapy ankle dorsiflexion/plantarflexion; inversion/eversion - 2 x 1 min Ambulated with cane; patient reports same level of pain as she does when using the crutch - x156f;  Side stepping up and over airex  -  x 10  Heel raises in standing on flat ground -   x 20  Single leg stance rotations on L LE only - x 10       PT Education - 05/11/16 1018    Education provided Yes   Education Details Educated on pain physiology and form/technique throughout exercise    Person(s) Educated Patient   Methods Explanation;Demonstration   Comprehension Verbalized understanding;Returned demonstration             PT Long Term Goals - 05/08/16 1525      PT LONG TERM GOAL #1   Title Patient will be independent in home exercise program to improve strength/mobility for better functional independence with ADLs.   Time 4   Period Weeks   Status On-going     PT LONG TERM GOAL #2   Title Patient will increase 10 meter walk test to >1.087m as to  improve gait speed for better community ambulation and to reduce fall risk.   Time 4   Period Weeks   Status Partially Met     PT LONG TERM GOAL #3   Title Patient will report a worst pain of 3/10 on VAS in  left ankle/foot           to improve tolerance with ADLs and reduced symptoms with activities.    Baseline 9/10   Time 4   Period Weeks   Status Not Met     PT LONG TERM GOAL #4   Title Patient will increase lower extremity functional scale to >60/80 to demonstrate improved functional mobility and increased tolerance with ADLs.    Time 4   Period Weeks   Status Partially Met     PT LONG TERM GOAL #5   Title patient will increase LLE ankle ROM: DF: 5 degrees, PF: 60 degrees, IV: 25  degrees, EV: 10 degrees for better functional ROM for standing/walking tasks.    Time 4   Period Weeks   Status Achieved               Plan - 05/11/16 1021    Clinical Impression Statement Introduced mirror therapy and educated on pain physiology today to address patient's increased pain and patient reports less stiffness sensation after performing mirror therapy but states overall pain is unchanged. However, patient's pain significantly decreased when performing exercises compared to previous exercise sessions indicating functional carryover between sessions. Patient will benefit from further skilled therapy focuses on improving limitations to return to prior level of function.    Rehab Potential Fair   Clinical Impairments Affecting Rehab Potential positive: young in age, family support; negative: chronic pain, possible CRPS; patient's clinical presentation is evolving due to pain fluctation and limited mobility;    PT Frequency 2x / week   PT Duration 4 weeks   PT Treatment/Interventions ADLs/Self Care Home Management;Aquatic Therapy;Cryotherapy;Electrical Stimulation;Gait training;DME Instruction;Ultrasound;Moist Heat;Iontophoresis 39m/ml Dexamethasone;Stair training;Functional mobility training;Therapeutic activities;Therapeutic exercise;Balance training;Neuromuscular re-education;Manual techniques;Patient/family education;Passive range of motion;Dry needling;Energy conservation;Taping   PT Next Visit Plan ROM/strength, manual therapy, stabilization exercise   PT Home Exercise Plan reinforced;    Consulted and Agree with Plan of Care Patient      Patient will benefit from skilled therapeutic intervention in order to improve the following deficits and impairments:  Pain, Decreased mobility, Decreased endurance, Decreased range of motion, Decreased strength, Hypomobility, Impaired flexibility, Difficulty walking, Decreased balance  Visit Diagnosis: Muscle weakness  (generalized)  Pain in left ankle and joints of left foot  Difficulty in walking, not elsewhere classified     Problem List Patient Active Problem List   Diagnosis Date Noted  . Hypertension   . Migraine headache   . History of nephrolithiasis   . Allergy     WBlythe Stanford PT DPT 05/11/2016, 10:39 AM  [place] [facility] [facility] [address] [place] [address]Phone: [phone]  Fax:  [phone] Name: JAERICA RINCONMRN: [id number] of Birth: 702-Apr-1967

## 2016-05-14 DIAGNOSIS — S92215D Nondisplaced fracture of cuboid bone of left foot, subsequent encounter for fracture with routine healing: Secondary | ICD-10-CM | POA: Diagnosis not present

## 2016-05-14 DIAGNOSIS — S92022D Displaced fracture of anterior process of left calcaneus, subsequent encounter for fracture with routine healing: Secondary | ICD-10-CM | POA: Diagnosis not present

## 2016-05-15 ENCOUNTER — Ambulatory Visit: Payer: 59 | Admitting: Physical Therapy

## 2016-05-15 ENCOUNTER — Encounter: Payer: Self-pay | Admitting: Physical Therapy

## 2016-05-15 DIAGNOSIS — M6281 Muscle weakness (generalized): Secondary | ICD-10-CM

## 2016-05-15 DIAGNOSIS — M25572 Pain in left ankle and joints of left foot: Secondary | ICD-10-CM | POA: Diagnosis not present

## 2016-05-15 DIAGNOSIS — R262 Difficulty in walking, not elsewhere classified: Secondary | ICD-10-CM | POA: Diagnosis not present

## 2016-05-15 NOTE — Therapy (Signed)
[place] MAIN [facility] SERVICES [address] [place], [place], [address] Phone: [phone]   Fax:  [phone]  Physical Therapy Treatment  Patient Details  Name: Jennifer Castaneda MRN: [id number] Date of Birth: 1965-05-28 Referring Provider: Dr. Sharlotte Alamo  Encounter Date: 05/15/2016      PT End of Session - 05/15/16 1106    Visit Number 16   Number of Visits 27   Date for PT Re-Evaluation 06/05/16   PT Start Time 1100   PT Stop Time 1155   PT Time Calculation (min) 55 min   Equipment Utilized During Treatment Gait belt   Activity Tolerance Patient tolerated treatment well;No increased pain   Behavior During Therapy [facility] for tasks assessed/performed      Past Medical History:  Diagnosis Date  . Allergy   . History of nephrolithiasis   . Hypertension   . Migraine headache     Past Surgical History:  Procedure Laterality Date  . BREAST SURGERY     cyst  . CESAREAN SECTION    . NASAL SINUS SURGERY      There were no vitals filed for this visit.      Subjective Assessment - 05/15/16 1104    Subjective Patient reports going to see Dr. Cleda Mccreedy and he said that everything was healing okay. She reports, "He said that it was healing a little slower than he thought but it was doing better."    Pertinent History personal factors affecting rehab: chronic condition, significant pain, weakness, not able to work, frustration/emotional stress;    Limitations Standing;Walking   How long can you stand comfortably? 10 min   How long can you walk comfortably? <300 feet with B axillary crutches   Diagnostic tests X-rays were repeated on 1/29 which showed calcaneocuboid joint  fracture more stable than previous films; fibula fracture incorporating as well;    Currently in Pain? Yes   Pain Score 2    Pain Location Foot   Pain Orientation Left;Lateral   Pain Descriptors / Indicators Aching;Sore   Pain Type Chronic pain   Pain Onset More  than a month ago         TREATMENT;  Warm up on recumbent bike x4 min (Unbilled):  Leg press: LLE only 60# 2x15 with cues to slow down LE movement for better strengthening; LLE only heel raises 60# 2x15 with cues to relax into ankle DF between reps for better ankle flexibility;   Standing on dynadisc: Mid stance position, forward/backward weight shift x10 with each foot in front; Heel/toe raises x15; Forward lunges x10 each foot in front; Feet apart one on each disc, BUE ball up/down x10, BUE ball side/side x10 each with cues to improve ankle strategies for better stance control; Patient denies any increase in pain in left ankle with dyna disc activity; She does require cues to improve stance control while standing on uneven surface with better ankle strategies;  Attempted LLE single leg heel raise but patient unable to do because of increased pain; Attempted tip toe (forward walk with heel raise) but too painful due to weakness;  Patient in long sitting: Manual therapy: Grade II-III AP/PA talocrual joint LLE ankle 20 sec bouts x4 each, Passive LLE ankle DF stretch 20 sec hold after each bout of joint mobs to facilitate increased ankle DF; Rhythmic stabilization with LLE ankle in neutral position 60 sec x2 reps with manual resistance; Required min A to hold LLE lower leg to isolate ankle stabilization;  Finished with TENs, modulated setting 150 Hz, 100-140 modulated duration x15 min concurrent with moist heat; Patient reports less pain to minimal after treatment session;                         PT Education - 05/15/16 1106    Education provided Yes   Education Details LLE ankle ROM/strengthening; HEP reinforced;    Person(s) Educated Patient   Methods Explanation;Verbal cues   Comprehension Verbalized understanding;Returned demonstration;Verbal cues required             PT Long Term Goals - 05/08/16 1525      PT LONG TERM GOAL #1   Title Patient  will be independent in home exercise program to improve strength/mobility for better functional independence with ADLs.   Time 4   Period Weeks   Status On-going     PT LONG TERM GOAL #2   Title Patient will increase 10 meter walk test to >1.38ms as to improve gait speed for better community ambulation and to reduce fall risk.   Time 4   Period Weeks   Status Partially Met     PT LONG TERM GOAL #3   Title Patient will report a worst pain of 3/10 on VAS in  left ankle/foot           to improve tolerance with ADLs and reduced symptoms with activities.    Baseline 9/10   Time 4   Period Weeks   Status Not Met     PT LONG TERM GOAL #4   Title Patient will increase lower extremity functional scale to >60/80 to demonstrate improved functional mobility and increased tolerance with ADLs.    Time 4   Period Weeks   Status Partially Met     PT LONG TERM GOAL #5   Title patient will increase LLE ankle ROM: DF: 5 degrees, PF: 60 degrees, IV: 25 degrees, EV: 10 degrees for better functional ROM for standing/walking tasks.    Time 4   Period Weeks   Status Achieved               Plan - 05/15/16 1150    Clinical Impression Statement Patient instructed in advanced LLE ankle strengthening and stability exercise. She reports slight increased discomfort with increased weight bearing ankle DF on LLE as compared to PF. Patient unable to do a single leg heel raise or tip toe due to ankle weakness and pain; Patient responded well to joint mobility manual therapy with improved talocrual movement and less discomfort; Finished with TENs with less pain reported. Patient would benefit from additional skilled PT intervention to improve ankle stabilization and reduce pain with ADLs.    Rehab Potential Fair   Clinical Impairments Affecting Rehab Potential positive: young in age, family support; negative: chronic pain, possible CRPS; patient's clinical presentation is evolving due to pain fluctation and  limited mobility;    PT Frequency 2x / week   PT Duration 4 weeks   PT Treatment/Interventions ADLs/Self Care Home Management;Aquatic Therapy;Cryotherapy;Electrical Stimulation;Gait training;DME Instruction;Ultrasound;Moist Heat;Iontophoresis 445mml Dexamethasone;Stair training;Functional mobility training;Therapeutic activities;Therapeutic exercise;Balance training;Neuromuscular re-education;Manual techniques;Patient/family education;Passive range of motion;Dry needling;Energy conservation;Taping   PT Next Visit Plan ROM/strength, manual therapy, stabilization exercise   PT Home Exercise Plan reinforced;    Consulted and Agree with Plan of Care Patient      Patient will benefit from skilled therapeutic intervention in order to improve the following deficits and impairments:  Pain, Decreased mobility, Decreased  endurance, Decreased range of motion, Decreased strength, Hypomobility, Impaired flexibility, Difficulty walking, Decreased balance  Visit Diagnosis: Muscle weakness (generalized)  Pain in left ankle and joints of left foot  Difficulty in walking, not elsewhere classified     Problem List Patient Active Problem List   Diagnosis Date Noted  . Hypertension   . Migraine headache   . History of nephrolithiasis   . Allergy     Trotter,Margaret PT, DPT 05/15/2016, 11:54 AM  [place] MAIN [facility] SERVICES [address] [place], [place], [address] Phone: [phone]   Fax:  [phone]  Name: TARENA GOCKLEY MRN: [id number] Date of Birth: 1966-01-01

## 2016-05-17 ENCOUNTER — Ambulatory Visit: Payer: 59 | Attending: Podiatry | Admitting: Physical Therapy

## 2016-05-17 ENCOUNTER — Encounter: Payer: Self-pay | Admitting: Physical Therapy

## 2016-05-17 DIAGNOSIS — R262 Difficulty in walking, not elsewhere classified: Secondary | ICD-10-CM | POA: Insufficient documentation

## 2016-05-17 DIAGNOSIS — M25572 Pain in left ankle and joints of left foot: Secondary | ICD-10-CM | POA: Diagnosis not present

## 2016-05-17 DIAGNOSIS — M6281 Muscle weakness (generalized): Secondary | ICD-10-CM | POA: Diagnosis not present

## 2016-05-17 NOTE — Therapy (Signed)
[place] MAIN [facility] SERVICES [address] [place], [place], [address] Phone: [phone]   Fax:  [phone]  Physical Therapy Treatment  Patient Details  Name: Jennifer Castaneda MRN: [id number] Date of Birth: 1965-06-27 Referring Provider: Dr. Sharlotte Alamo  Encounter Date: 05/17/2016      PT End of Session - 05/17/16 1222    Visit Number 17   Number of Visits 27   Date for PT Re-Evaluation 06/05/16   PT Start Time 1105   PT Stop Time 1150   PT Time Calculation (min) 45 min   Activity Tolerance Patient tolerated treatment well   Behavior During Therapy [facility] for tasks assessed/performed      Past Medical History:  Diagnosis Date  . Allergy   . History of nephrolithiasis   . Hypertension   . Migraine headache     Past Surgical History:  Procedure Laterality Date  . BREAST SURGERY     cyst  . CESAREAN SECTION    . NASAL SINUS SURGERY      There were no vitals filed for this visit.      Subjective Assessment - 05/17/16 1114    Subjective Pt reports having less pain along the left foot, but does note some along lateral side. Compliant with HEP.    Pertinent History personal factors affecting rehab: chronic condition, significant pain, weakness, not able to work, frustration/emotional stress;    Limitations Standing;Walking   How long can you stand comfortably? 10 min   How long can you walk comfortably? <300 feet with B axillary crutches   Diagnostic tests X-rays were repeated on 1/29 which showed calcaneocuboid joint  fracture more stable than previous films; fibula fracture incorporating as well;    Currently in Pain? Yes   Pain Score 1    Pain Location Foot   Pain Orientation Left;Lateral   Pain Descriptors / Indicators Aching;Sore   Pain Type Chronic pain   Pain Onset More than a month ago      TREATMENT;  Warm up on recumbent bike x4 min (Unbilled):  Standing on airex BLE feet slightly apart: BUE chest  press x10 reps, 2 sets BUE chest fly x10 reps; 2 sets  Standing on airex RLE on toe to facilitate increased weight bearing on LLE BUE chest press x10 reps; 2 sets BUE shoulder fly x10 reps; 2 sets  Standing on rockerboard: Forward/backward rocker with rail assist with mod Vcs to avoid trunk movement to isolate ankle ROM for better ankle flexibility x2 min;  LLE stance on green dyna disc with B rail assist: - initial stance working on reducing UE weight bearing for increased LLE stance; - LLE ankle DF/PF with min Vcs to avoid knee flexion for better ankle ROM x10 reps; -LLE ankle IV/EV with min VCS to avoid painful ROM and to improve ankle movement x10 reps; -LLE ankle circles clockwise/counterclockise x10 reps ea with B rail assist and and right toe down for better tolerance;  L ankle posterior glide Grade 1-2 mobilization with L foot on step with body weight shift for L DF; UE support on rails. L ankle posterior glide Grade 2-3 mobilization with functional step through with RLE for increased L DF  Supine L foot posterior/anterior mobilization (good mobility) Supine L lateral malleoli posterior glide.  Gentle DT sweeps between metatarsals 3x ea dorsum of foot; distal to proximal; followed by anterior retinaculum sweeps Gentle plantar fascia sweeps 3 sets of 3; followed by gentle metatarsal mobs and  5th ray MET head mob  Oscillations to L ankle with medial/lateral grasp of foot with downward spring  Oscillations to L ankle with heel/dorsum of foot grasp with caudal spring (improved result)                              PT Education - 05/17/16 1116    Education Details L plantar foot massage with tennis ball followed by ice. Progression to golf ball for deeper more focal massage. Improved step/stride length to maintain improved DF on L   Person(s) Educated Patient   Methods Explanation;Demonstration   Comprehension Verbalized understanding;Returned demonstration              PT Long Term Goals - 05/08/16 1525      PT LONG TERM GOAL #1   Title Patient will be independent in home exercise program to improve strength/mobility for better functional independence with ADLs.   Time 4   Period Weeks   Status On-going     PT LONG TERM GOAL #2   Title Patient will increase 10 meter walk test to >1.21ms as to improve gait speed for better community ambulation and to reduce fall risk.   Time 4   Period Weeks   Status Partially Met     PT LONG TERM GOAL #3   Title Patient will report a worst pain of 3/10 on VAS in  left ankle/foot           to improve tolerance with ADLs and reduced symptoms with activities.    Baseline 9/10   Time 4   Period Weeks   Status Not Met     PT LONG TERM GOAL #4   Title Patient will increase lower extremity functional scale to >60/80 to demonstrate improved functional mobility and increased tolerance with ADLs.    Time 4   Period Weeks   Status Partially Met     PT LONG TERM GOAL #5   Title patient will increase LLE ankle ROM: DF: 5 degrees, PF: 60 degrees, IV: 25 degrees, EV: 10 degrees for better functional ROM for standing/walking tasks.    Time 4   Period Weeks   Status Achieved               Plan - 05/17/16 1225    Clinical Impression Statement Pt continues with minimal to moderate pain with weightbearing activities depending on length/intensity. DF subjectively and visually limited with ambulation pre session; improved post MT end of session. Pt has tendency to return to habitual gait patter; therefore, instructed in attention to step length on R to maintain greater DF on L. Pt instructed in self plantar fascia massage with tennis ball to start with education on progression to golf ball for deeper progressive massage. Educated to follow with CP post massage to reduce inflammation. Continue PT treatment to improve strength, functional range and reduce pain with activity/function.    Rehab Potential Fair    Clinical Impairments Affecting Rehab Potential positive: young in age, family support; negative: chronic pain, possible CRPS; patient's clinical presentation is evolving due to pain fluctation and limited mobility;    PT Frequency 2x / week   PT Duration 4 weeks   PT Treatment/Interventions ADLs/Self Care Home Management;Aquatic Therapy;Cryotherapy;Electrical Stimulation;Gait training;DME Instruction;Ultrasound;Moist Heat;Iontophoresis 481mml Dexamethasone;Stair training;Functional mobility training;Therapeutic activities;Therapeutic exercise;Balance training;Neuromuscular re-education;Manual techniques;Patient/family education;Passive range of motion;Dry needling;Energy conservation;Taping   PT Next Visit Plan ROM/strength, manual therapy, stabilization exercise   PT  Home Exercise Plan reinforced;    Consulted and Agree with Plan of Care Patient      Patient will benefit from skilled therapeutic intervention in order to improve the following deficits and impairments:  Pain, Decreased mobility, Decreased endurance, Decreased range of motion, Decreased strength, Hypomobility, Impaired flexibility, Difficulty walking, Decreased balance  Visit Diagnosis: Muscle weakness (generalized)  Pain in left ankle and joints of left foot  Difficulty in walking, not elsewhere classified     Problem List Patient Active Problem List   Diagnosis Date Noted  . Hypertension   . Migraine headache   . History of nephrolithiasis   . Allergy     Trotter,Margaret PT, DPT  05/17/2016, 12:34 PM  [place] MAIN [facility] SERVICES [address] [place], [place], [address] Phone: [phone]   Fax:  [phone]  Name: DASHONDA BONNEAU MRN: [id number] Date of Birth: [birth year]

## 2016-05-22 ENCOUNTER — Ambulatory Visit: Payer: 59

## 2016-05-22 DIAGNOSIS — R262 Difficulty in walking, not elsewhere classified: Secondary | ICD-10-CM

## 2016-05-22 DIAGNOSIS — M6281 Muscle weakness (generalized): Secondary | ICD-10-CM | POA: Diagnosis not present

## 2016-05-22 DIAGNOSIS — M25572 Pain in left ankle and joints of left foot: Secondary | ICD-10-CM | POA: Diagnosis not present

## 2016-05-22 NOTE — Therapy (Signed)
[place] MAIN [facility] SERVICES [address] [place], [place], [address] Phone: [phone]   Fax:  [phone]  Physical Therapy Treatment  Patient Details  Name: Jennifer Castaneda MRN: [id number] Date of Birth: April 27, 1965 Referring Provider: Dr. Sharlotte Alamo  Encounter Date: 05/22/2016      PT End of Session - 05/22/16 1801    Visit Number 18   Number of Visits 27   Date for PT Re-Evaluation 06/05/16   PT Start Time 0539   PT Stop Time 1515   PT Time Calculation (min) 38 min   Equipment Utilized During Treatment Gait belt   Activity Tolerance Patient tolerated treatment well   Behavior During Therapy [facility] for tasks assessed/performed      Past Medical History:  Diagnosis Date  . Allergy   . History of nephrolithiasis   . Hypertension   . Migraine headache     Past Surgical History:  Procedure Laterality Date  . BREAST SURGERY     cyst  . CESAREAN SECTION    . NASAL SINUS SURGERY      There were no vitals filed for this visit.      Subjective Assessment - 05/22/16 1440    Subjective Patient reports no major changes in the ankle and states the pain in the ankle has felt about the same since the beginning of January.   Pertinent History personal factors affecting rehab: chronic condition, significant pain, weakness, not able to work, frustration/emotional stress;    Limitations Standing;Walking   How long can you stand comfortably? 10 min   How long can you walk comfortably? <300 feet with B axillary crutches   Diagnostic tests X-rays were repeated on 1/29 which showed calcaneocuboid joint  fracture more stable than previous films; fibula fracture incorporating as well;    Currently in Pain? Yes   Pain Score 2    Pain Location Foot   Pain Orientation Left;Lateral   Pain Descriptors / Indicators Aching;Sore   Pain Type Chronic pain   Pain Onset More than a month ago   Pain Frequency Constant       TREATMENT; Manual Therapy: L ankle posterior glide Grade 2-3 mobilization with functional step through with RLE for increased L DF. Oscillations to L ankle with medial/lateral grasp of foot with downward spring  Oscillations to L ankle with heel/dorsum of foot grasp with caudal spring. Supine L foot posterior/anterior mobilization. STM performed along plantar fascia and along Achilles tendon to decrease pain and spasms.    Therapeutic Exercise Warm up on recumbent bike x4 min for greater mobility BAPS level 2 - anterior/posterior; laterally, rotations cw/ccw - 2 min each direction Marches on bosu with intermittent UE support - x20 Black side bosu feet together balance feet together - x2 min balance Black side bosu weight shifting laterally - 64mn        PT Education - 05/22/16 1800    Education provided Yes   Education Details Education on pain physiology; HEP: continue performing band exercises to continue building ankle strength   Person(s) Educated Patient   Methods Explanation;Demonstration   Comprehension Verbalized understanding;Returned demonstration             PT Long Term Goals - 05/08/16 1525      PT LONG TERM GOAL #1   Title Patient will be independent in home exercise program to improve strength/mobility for better functional independence with ADLs.   Time 4   Period Weeks   Status On-going  PT LONG TERM GOAL #2   Title Patient will increase 10 meter walk test to >1.47ms as to improve gait speed for better community ambulation and to reduce fall risk.   Time 4   Period Weeks   Status Partially Met     PT LONG TERM GOAL #3   Title Patient will report a worst pain of 3/10 on VAS in  left ankle/foot           to improve tolerance with ADLs and reduced symptoms with activities.    Baseline 9/10   Time 4   Period Weeks   Status Not Met     PT LONG TERM GOAL #4   Title Patient will increase lower extremity functional scale to >60/80 to demonstrate  improved functional mobility and increased tolerance with ADLs.    Time 4   Period Weeks   Status Partially Met     PT LONG TERM GOAL #5   Title patient will increase LLE ankle ROM: DF: 5 degrees, PF: 60 degrees, IV: 25 degrees, EV: 10 degrees for better functional ROM for standing/walking tasks.    Time 4   Period Weeks   Status Achieved               Plan - 05/22/16 1802    Clinical Impression Statement Focused on attempting to decrease patients pain with manual therapy and weight bearing exercise. Patient demonstrates minimal to no decrease in pain after performing manual techniques. Patient continues to demonstrate improved ability to perform exercises but continues to have significant increase in pain. Educated patient on pain science and to use tactile stimuli to place along painful areas for HEP and to perform band exercises to build up ankle strength. Patient will benefit from further skilled therapy focused on improving ankle function and strength to return to prior level of function.    Rehab Potential Fair   Clinical Impairments Affecting Rehab Potential positive: young in age, family support; negative: chronic pain, possible CRPS; patient's clinical presentation is evolving due to pain fluctation and limited mobility;    PT Frequency 2x / week   PT Duration 4 weeks   PT Treatment/Interventions ADLs/Self Care Home Management;Aquatic Therapy;Cryotherapy;Electrical Stimulation;Gait training;DME Instruction;Ultrasound;Moist Heat;Iontophoresis 420mml Dexamethasone;Stair training;Functional mobility training;Therapeutic activities;Therapeutic exercise;Balance training;Neuromuscular re-education;Manual techniques;Patient/family education;Passive range of motion;Dry needling;Energy conservation;Taping   PT Next Visit Plan ROM/strength, manual therapy, stabilization exercise   PT Home Exercise Plan reinforced;    Consulted and Agree with Plan of Care Patient      Patient will  benefit from skilled therapeutic intervention in order to improve the following deficits and impairments:  Pain, Decreased mobility, Decreased endurance, Decreased range of motion, Decreased strength, Hypomobility, Impaired flexibility, Difficulty walking, Decreased balance  Visit Diagnosis: Muscle weakness (generalized)  Pain in left ankle and joints of left foot  Difficulty in walking, not elsewhere classified     Problem List Patient Active Problem List   Diagnosis Date Noted  . Hypertension   . Migraine headache   . History of nephrolithiasis   . Allergy     WeBlythe StanfordPT DPT 05/22/2016, 6:09 PM  [facility] RECook HospitalERVICES [id number] South Cedar StreetdPark [id number]: [phone] Fax:  [phone]: JoKADIJAH SHAMOONRN: [id number] of Birth: [birth year]

## 2016-05-24 ENCOUNTER — Ambulatory Visit: Payer: 59 | Admitting: Physical Therapy

## 2016-05-24 ENCOUNTER — Encounter: Payer: Self-pay | Admitting: Physical Therapy

## 2016-05-24 DIAGNOSIS — M25572 Pain in left ankle and joints of left foot: Secondary | ICD-10-CM

## 2016-05-24 DIAGNOSIS — M6281 Muscle weakness (generalized): Secondary | ICD-10-CM

## 2016-05-24 DIAGNOSIS — R262 Difficulty in walking, not elsewhere classified: Secondary | ICD-10-CM

## 2016-05-24 NOTE — Therapy (Signed)
[place] MAIN [facility] SERVICES [address] [place], [place], [address] Phone: [phone]   Fax:  [phone]  Physical Therapy Treatment  Patient Details  Name: Jennifer Castaneda MRN: [id number] Date of Birth: [birth year] Referring Provider: Dr. Sharlotte Alamo  Encounter Date: 05/24/2016      PT End of Session - 05/24/16 1312    Visit Number 19   Number of Visits 27   Date for PT Re-Evaluation 06/05/16   PT Start Time 1302   PT Stop Time 1345   PT Time Calculation (min) 43 min   Equipment Utilized During Treatment Gait belt   Activity Tolerance Patient tolerated treatment well   Behavior During Therapy [facility] for tasks assessed/performed      Past Medical History:  Diagnosis Date  . Allergy   . History of nephrolithiasis   . Hypertension   . Migraine headache     Past Surgical History:  Procedure Laterality Date  . BREAST SURGERY     cyst  . CESAREAN SECTION    . NASAL SINUS SURGERY      There were no vitals filed for this visit.      Subjective Assessment - 05/24/16 1311    Subjective Patient reports feeling less stiffness in LLE ankle following treatment session, but will feel a flare up on same day of PT session; She reports feeling most relief the day after each therapy session;    Pertinent History personal factors affecting rehab: chronic condition, significant pain, weakness, not able to work, frustration/emotional stress;    Limitations Standing;Walking   How long can you stand comfortably? 10 min   How long can you walk comfortably? <300 feet with B axillary crutches   Diagnostic tests X-rays were repeated on 1/29 which showed calcaneocuboid joint  fracture more stable than previous films; fibula fracture incorporating as well;    Currently in Pain? Yes   Pain Score 4    Pain Location Foot   Pain Orientation Left;Lateral   Pain Descriptors / Indicators Aching;Sore   Pain Onset More than a month ago        TREATMENT;  Warm up on recumbent bike x4 min (Unbilled):  Leg press: LLE only 75# 3x10 Leg press LLE onlly ankle PF 75# heel raises 3x10 with min VCs to avoid painful ROM and to improve positioning for better ankle strengthening;  LLE ankle ROM: DF 2 degrees, PF: 75 degrees; prior to manual therapy:  L ankle posterior glide Grade 2-3 mobilization with functional step through with RLE for increased L DF 2x 10 reps   Patient prone: PT performed soft/deep tissue massage along left calf soft tissue. Identified multiple trigger points in gastrocnemius and peroneals/extensor hallucis muscles. Patient is supposed to have dry needling trigger point release treatment next week. This should help with some discomfort; Patient reports increased heel pain on LLE along plantar surface and along plantar fascia; PT performed soft/deep tissue massage with cross friction and ASTYM using edge tool along left plantar fascia with foot stretched into ankle DF and toe extension for increased stretch on plantar fascia x20 min;  Patient reports less heel pain upon standing and less burning discomfort in left foot;  LLE ankle AROM following manual therapy: DF: 8 degrees, PF 75 degrees.   Educated patient to increased tband ankle strengthening repetitions to 2 sets of 20, 2x a day and to start using a frozen water bottle along bottom of left foot to improve plantar fascia stretch and  reduce discomfort;                        PT Education - 05/24/16 1312    Education provided Yes   Education Details ankle ROM/stretch/strengthening; HEP reinforced;    Person(s) Educated Patient   Methods Explanation;Verbal cues   Comprehension Verbalized understanding;Returned demonstration;Verbal cues required             PT Long Term Goals - 05/08/16 1525      PT LONG TERM GOAL #1   Title Patient will be independent in home exercise program to improve strength/mobility for better functional  independence with ADLs.   Time 4   Period Weeks   Status On-going     PT LONG TERM GOAL #2   Title Patient will increase 10 meter walk test to >1.79ms as to improve gait speed for better community ambulation and to reduce fall risk.   Time 4   Period Weeks   Status Partially Met     PT LONG TERM GOAL #3   Title Patient will report a worst pain of 3/10 on VAS in  left ankle/foot           to improve tolerance with ADLs and reduced symptoms with activities.    Baseline 9/10   Time 4   Period Weeks   Status Not Met     PT LONG TERM GOAL #4   Title Patient will increase lower extremity functional scale to >60/80 to demonstrate improved functional mobility and increased tolerance with ADLs.    Time 4   Period Weeks   Status Partially Met     PT LONG TERM GOAL #5   Title patient will increase LLE ankle ROM: DF: 5 degrees, PF: 60 degrees, IV: 25 degrees, EV: 10 degrees for better functional ROM for standing/walking tasks.    Time 4   Period Weeks   Status Achieved               Plan - 05/24/16 1412    Clinical Impression Statement Patient instructed in LLE ankle strengthening. Advanced exercise with increased resistance. Patient reports increased difficulty with leg press with increased resistance. she reports increased left heel pain in weight bearing. PT performed posterior glide in weight bearing to facilitate increased ankle DF. Patient responded well with less pain and improved ankle ROM following treatment session. She would benefit from additional skilled PT intervention to improve LE strength, ROM and reduce pain with ADLs. Patient is hoping to go back to work in 3 weeks.    Rehab Potential Fair   Clinical Impairments Affecting Rehab Potential positive: young in age, family support; negative: chronic pain, possible CRPS; patient's clinical presentation is evolving due to pain fluctation and limited mobility;    PT Frequency 2x / week   PT Duration 4 weeks   PT  Treatment/Interventions ADLs/Self Care Home Management;Aquatic Therapy;Cryotherapy;Electrical Stimulation;Gait training;DME Instruction;Ultrasound;Moist Heat;Iontophoresis 426mml Dexamethasone;Stair training;Functional mobility training;Therapeutic activities;Therapeutic exercise;Balance training;Neuromuscular re-education;Manual techniques;Patient/family education;Passive range of motion;Dry needling;Energy conservation;Taping   PT Next Visit Plan ROM/strength, manual therapy, stabilization exercise   PT Home Exercise Plan reinforced;    Consulted and Agree with Plan of Care Patient      Patient will benefit from skilled therapeutic intervention in order to improve the following deficits and impairments:  Pain, Decreased mobility, Decreased endurance, Decreased range of motion, Decreased strength, Hypomobility, Impaired flexibility, Difficulty walking, Decreased balance  Visit Diagnosis: Muscle weakness (generalized)  Pain in left ankle  and joints of left foot  Difficulty in walking, not elsewhere classified     Problem List Patient Active Problem List   Diagnosis Date Noted  . Hypertension   . Migraine headache   . History of nephrolithiasis   . Allergy     Trotter,Margaret PT, DPT 05/24/2016, 2:17 PM  [place] MAIN [facility] SERVICES [address] [place], [place], [address] Phone: [phone]   Fax:  [phone]  Name: Jennifer Castaneda MRN: [id number] Date of Birth: [birth year]

## 2016-05-29 ENCOUNTER — Ambulatory Visit: Payer: 59

## 2016-05-29 DIAGNOSIS — M6281 Muscle weakness (generalized): Secondary | ICD-10-CM

## 2016-05-29 DIAGNOSIS — R262 Difficulty in walking, not elsewhere classified: Secondary | ICD-10-CM | POA: Diagnosis not present

## 2016-05-29 DIAGNOSIS — M25572 Pain in left ankle and joints of left foot: Secondary | ICD-10-CM | POA: Diagnosis not present

## 2016-05-29 NOTE — Therapy (Signed)
[place] MAIN [facility] SERVICES [address] [place], [place], [address] Phone: [phone]   Fax:  [phone]  Physical Therapy Treatment  Patient Details  Name: Jennifer Castaneda MRN: [id number] Date of Birth: 06-17-1965 Referring Provider: Dr. Sharlotte Alamo  Encounter Date: 05/29/2016      PT End of Session - 05/29/16 1326    Visit Number 20   Number of Visits 27   Date for PT Re-Evaluation 06/05/16   PT Start Time 0940   PT Stop Time 1015   PT Time Calculation (min) 35 min   Equipment Utilized During Treatment Gait belt   Activity Tolerance Patient tolerated treatment well   Behavior During Therapy [facility] for tasks assessed/performed      Past Medical History:  Diagnosis Date  . Allergy   . History of nephrolithiasis   . Hypertension   . Migraine headache     Past Surgical History:  Procedure Laterality Date  . BREAST SURGERY     cyst  . CESAREAN SECTION    . NASAL SINUS SURGERY      There were no vitals filed for this visit.      Subjective Assessment - 05/29/16 1324    Subjective Patient reports she has "good days and bad days". Patient states she would like to return to a gym routine and wants to go over a few exercises.    Pertinent History personal factors affecting rehab: chronic condition, significant pain, weakness, not able to work, frustration/emotional stress;    Limitations Standing;Walking   How long can you stand comfortably? 10 min   How long can you walk comfortably? <300 feet with B axillary crutches   Diagnostic tests X-rays were repeated on 1/29 which showed calcaneocuboid joint  fracture more stable than previous films; fibula fracture incorporating as well;    Currently in Pain? Yes   Pain Score 2    Pain Location Ankle   Pain Orientation Left;Lateral   Pain Descriptors / Indicators Aching   Pain Type Chronic pain   Pain Onset More than a month ago      TREATMENT: Manual Therapy: STM to  the achilles, gastrocnemius, and soleus with the patient positioned in supine. A->P &    P->A mobilizations 3 x 30sec grade III, IV; to improve joint ROM and decrease pain. TC distraction grade IV to improve ankle mobility.  Therapeutic Exercise Single leg stance with UE support - 2 x 45sec B Tandem stance with intermittent UE support - 2 x 45sec B Heel raises in standing - x20 with minimal UE support Manually resisted isometric four-way ankle -- 5 sec hold x5 each way  Hip abduction in standing - x15 Squats with cueing on body and knee position - x 10 *Patient able to perform exercises with correct form but requires cueing on proper joint positioning       PT Education - 05/29/16 1326    Education provided Yes   Education Details HEP: ankle strengthening at home   Person(s) Educated Patient   Methods Explanation;Demonstration   Comprehension Verbalized understanding;Returned demonstration             PT Long Term Goals - 05/08/16 1525      PT LONG TERM GOAL #1   Title Patient will be independent in home exercise program to improve strength/mobility for better functional independence with ADLs.   Time 4   Period Weeks   Status On-going     PT LONG TERM GOAL #  2   Title Patient will increase 10 meter walk test to >1.19ms as to improve gait speed for better community ambulation and to reduce fall risk.   Time 4   Period Weeks   Status Partially Met     PT LONG TERM GOAL #3   Title Patient will report a worst pain of 3/10 on VAS in  left ankle/foot           to improve tolerance with ADLs and reduced symptoms with activities.    Baseline 9/10   Time 4   Period Weeks   Status Not Met     PT LONG TERM GOAL #4   Title Patient will increase lower extremity functional scale to >60/80 to demonstrate improved functional mobility and increased tolerance with ADLs.    Time 4   Period Weeks   Status Partially Met     PT LONG TERM GOAL #5   Title patient will increase LLE ankle  ROM: DF: 5 degrees, PF: 60 degrees, IV: 25 degrees, EV: 10 degrees for better functional ROM for standing/walking tasks.    Time 4   Period Weeks   Status Achieved               Plan - 05/29/16 1327    Clinical Impression Statement Patient demonstrates decreased ankle pain today versus previous visits with STM indicating functional carryover between visits. Focused on improving ankle strengthening with balancing activity to return to recreational activities such as performing squating and working out. Patient requires moderate cueing for squat technique to decrease anterior translation of B knee joints. Patient will benefit from further skilled therapy to return to prior level of function.    Rehab Potential Fair   Clinical Impairments Affecting Rehab Potential positive: young in age, family support; negative: chronic pain, possible CRPS; patient's clinical presentation is evolving due to pain fluctation and limited mobility;    PT Frequency 2x / week   PT Duration 4 weeks   PT Treatment/Interventions ADLs/Self Care Home Management;Aquatic Therapy;Cryotherapy;Electrical Stimulation;Gait training;DME Instruction;Ultrasound;Moist Heat;Iontophoresis 422mml Dexamethasone;Stair training;Functional mobility training;Therapeutic activities;Therapeutic exercise;Balance training;Neuromuscular re-education;Manual techniques;Patient/family education;Passive range of motion;Dry needling;Energy conservation;Taping   PT Next Visit Plan ROM/strength, manual therapy, stabilization exercise   PT Home Exercise Plan reinforced;    Consulted and Agree with Plan of Care Patient      Patient will benefit from skilled therapeutic intervention in order to improve the following deficits and impairments:  Pain, Decreased mobility, Decreased endurance, Decreased range of motion, Decreased strength, Hypomobility, Impaired flexibility, Difficulty walking, Decreased balance  Visit Diagnosis: Muscle weakness  (generalized)  Pain in left ankle and joints of left foot  Difficulty in walking, not elsewhere classified     Problem List Patient Active Problem List   Diagnosis Date Noted  . Hypertension   . Migraine headache   . History of nephrolithiasis   . Allergy     WeBlythe StanfordPT DPT 05/29/2016, 1:30 PM  [place]al[place]AIN REAtlantic Surgery And Laser Center LLCERVICES [address]uedEdomNC[place]hone: [phone] Fax:  [phone]: Jennifer PLUNKRN: [id number] of Birth: 10/1965-07-25

## 2016-05-30 ENCOUNTER — Encounter: Payer: Self-pay | Admitting: Physician Assistant

## 2016-05-30 ENCOUNTER — Ambulatory Visit (INDEPENDENT_AMBULATORY_CARE_PROVIDER_SITE_OTHER): Payer: 59 | Admitting: Physician Assistant

## 2016-05-30 VITALS — BP 140/102 | HR 70 | Temp 98.0°F | Resp 16 | Wt 161.8 lb

## 2016-05-30 DIAGNOSIS — I10 Essential (primary) hypertension: Secondary | ICD-10-CM | POA: Diagnosis not present

## 2016-05-30 DIAGNOSIS — R319 Hematuria, unspecified: Secondary | ICD-10-CM | POA: Diagnosis not present

## 2016-05-30 LAB — URINALYSIS, ROUTINE W REFLEX MICROSCOPIC
BILIRUBIN URINE: NEGATIVE
GLUCOSE, UA: NEGATIVE
KETONES UR: NEGATIVE
Leukocytes, UA: NEGATIVE
NITRITE: NEGATIVE
PH: 5.5 (ref 5.0–8.0)
Protein, ur: NEGATIVE
SPECIFIC GRAVITY, URINE: 1.025 (ref 1.001–1.035)

## 2016-05-30 LAB — URINALYSIS, MICROSCOPIC ONLY
CASTS: NONE SEEN [LPF]
CRYSTALS: NONE SEEN [HPF]
YEAST: NONE SEEN [HPF]

## 2016-05-30 MED ORDER — AMLODIPINE BESYLATE 5 MG PO TABS: 5.0000 mg | ORAL_TABLET | Freq: Every day | ORAL | 3 refills | Status: DC

## 2016-05-31 ENCOUNTER — Ambulatory Visit: Payer: 59 | Admitting: Physical Therapy

## 2016-05-31 DIAGNOSIS — M6281 Muscle weakness (generalized): Secondary | ICD-10-CM | POA: Diagnosis not present

## 2016-05-31 DIAGNOSIS — R262 Difficulty in walking, not elsewhere classified: Secondary | ICD-10-CM | POA: Diagnosis not present

## 2016-05-31 DIAGNOSIS — M25572 Pain in left ankle and joints of left foot: Secondary | ICD-10-CM | POA: Diagnosis not present

## 2016-05-31 NOTE — Therapy (Signed)
[facility] [address], [place], [address] Phone: [phone]   Fax:  [phone]  Physical Therapy Treatment  Patient Details  Name: Jennifer Castaneda MRN: [id number] Date of Birth: January 27, 1966 Referring Provider: Dr. Sharlotte Alamo  Encounter Date: 05/31/2016      PT End of Session - 05/31/16 0826    Visit Number 21   Number of Visits 27   Date for PT Re-Evaluation 06/05/16   PT Start Time 0824   PT Stop Time 0900   PT Time Calculation (min) 36 min   Equipment Utilized During Treatment Gait belt   Activity Tolerance Patient tolerated treatment well   Behavior During Therapy [facility] for tasks assessed/performed      Past Medical History:  Diagnosis Date  . Allergy   . History of nephrolithiasis   . Hypertension   . Migraine headache     Past Surgical History:  Procedure Laterality Date  . BREAST SURGERY     cyst  . CESAREAN SECTION    . NASAL SINUS SURGERY      There were no vitals filed for this visit.      Subjective Assessment - 05/31/16 0827    Subjective Pt states STM has helped her pain some, is looking to see if dry needling will help. Pt states her foot feels stiff and she can't move her L foot as well when walking.    Patient is accompained by: Family member   Pertinent History personal factors affecting rehab: chronic condition, significant pain, weakness, not able to work, frustration/emotional stress;    Limitations Standing;Walking   How long can you stand comfortably? 10 min   How long can you walk comfortably? <300 feet with B axillary crutches   Diagnostic tests X-rays were repeated on 1/29 which showed calcaneocuboid joint  fracture more stable than previous films; fibula fracture incorporating as well;    Currently in Pain? Yes   Pain Score 3    Pain Location Ankle   Pain Orientation Left   Pain Descriptors / Indicators Aching   Pain Type Chronic pain   Pain Onset  More than a month ago   Pain Frequency Constant      Objective:  Palpation of and STM performed on LLE, identified TrP in L peroneus longus/brevis, extensor digitorum longus/brevis, and gastrocs for dry needling. Pt reports these areas are TTP, feels "burning" with palpation to EDL. Observed decreased fat pad of L foot.   Performed dry needling to L peroneus longus/brevis, and extensor digitorum longus/brevis. Pt required frequent distraction to tolerate treatment. (No charge)  Pt amb without shoes which she reports is normally too painful to perform, demonstrated increased ability to WB and advance weight through L foot and pt reports walking better. Educated pt to walk throughout the day with shoes off when inside her home, and shoes on when outside. Pt educated that walking on hard floor and carpeted floor will be beneficial.   Golf ball STM, pt instructed to roll ball along the bottom of her L foot every 2 hours to decrease tightness and increase foot ROM. Provided option to use a "foot reflexology" mat to increase L foot proprioception. Instructed pt to discontinue STM with frozen water bottle.                             PT Education - 05/31/16 [phone]    Education provided Yes  Education Details Dry needling treatment/aftercare, rolling foot on golf ball for self STM, walking   Person(s) Educated Patient   Methods Explanation   Comprehension Verbalized understanding             PT Long Term Goals - 05/08/16 1525      PT LONG TERM GOAL #1   Title Patient will be independent in home exercise program to improve strength/mobility for better functional independence with ADLs.   Time 4   Period Weeks   Status On-going     PT LONG TERM GOAL #2   Title Patient will increase 10 meter walk test to >1.88ms as to improve gait speed for better community ambulation and to reduce fall risk.   Time 4   Period Weeks   Status Partially Met     PT LONG TERM GOAL #3    Title Patient will report a worst pain of 3/10 on VAS in  left ankle/foot           to improve tolerance with ADLs and reduced symptoms with activities.    Baseline 9/10   Time 4   Period Weeks   Status Not Met     PT LONG TERM GOAL #4   Title Patient will increase lower extremity functional scale to >60/80 to demonstrate improved functional mobility and increased tolerance with ADLs.    Time 4   Period Weeks   Status Partially Met     PT LONG TERM GOAL #5   Title patient will increase LLE ankle ROM: DF: 5 degrees, PF: 60 degrees, IV: 25 degrees, EV: 10 degrees for better functional ROM for standing/walking tasks.    Time 4   Period Weeks   Status Achieved               Plan - 05/31/16 0840    Clinical Impression Statement Pt presents with difficulty with WB on L foot. Pt tolerated dry needling to L LE musculature, slight apprehension during session. Pt appeared to amb with greater weight placed on L foot. Introduced [employer]with golf ball to help decrease stiffness in L foot. Pt instructed to walk throughout the day with shoes off when inside the house, and shoes on when outside.    Rehab Potential Fair   Clinical Impairments Affecting Rehab Potential positive: young in age, family support; negative: chronic pain, possible CRPS; patient's clinical presentation is evolving due to pain fluctation and limited mobility;    PT Frequency 2x / week   PT Duration 4 weeks   PT Treatment/Interventions ADLs/Self Care Home Management;Aquatic Therapy;Cryotherapy;Electrical Stimulation;Gait training;DME Instruction;Ultrasound;Moist Heat;Iontophoresis 435mml Dexamethasone;Stair training;Functional mobility training;Therapeutic activities;Therapeutic exercise;Balance training;Neuromuscular re-education;Manual techniques;Patient/family education;Passive range of motion;Dry needling;Energy conservation;Taping   PT Next Visit Plan ROM/strength, manual therapy, stabilization exercise   PT Home  Exercise Plan reinforced;    Consulted and Agree with Plan of Care Patient      Patient will benefit from skilled therapeutic intervention in order to improve the following deficits and impairments:  Pain, Decreased mobility, Decreased endurance, Decreased range of motion, Decreased strength, Hypomobility, Impaired flexibility, Difficulty walking, Decreased balance  Visit Diagnosis: Pain in left ankle and joints of left foot     Problem List Patient Active Problem List   Diagnosis Date Noted  . Hypertension   . Migraine headache   . History of nephrolithiasis   . Allergy    TrManfred ArchSPT Raizel Wesolowski PT DPT 05/31/2016, 9:18 AM  [facility] [address], [place], [address] Phone: [phone]   Fax:  [phone]  Name: Jennifer Castaneda MRN: [id number] Date of Birth: [birth year]

## 2016-05-31 NOTE — Progress Notes (Signed)
Patient ID: Jennifer Castaneda MRN: [id number], DOB: 12/15/1965, 51 y.o. Date of Encounter: @DATE @  Chief Complaint:  Chief Complaint  Patient presents with  . Hematuria    couple of weeks/ off and on     HPI: 51 y.o. year old female  presents with above.  Patient reports that she has seen some blood in her urine off and on over the last couple of weeks. States that she has a history of kidney stones but has had no pain in any area at all to suggest that this is a kidney stone. Has had no dysuria, no frequency, no urgency. Says she doesn't know if it could be because of high blood pressure or because of taking a lot of Advil for her foot pain. She has been to the foot doctor recently has noted the blood pressure has been high at those visits. Systolic is usually [id number].   Past Medical History:  Diagnosis Date  . Allergy   . History of nephrolithiasis   . Hypertension   . Migraine headache      Home Meds: Outpatient Medications Prior to Visit  Medication Sig Dispense Refill  . azithromycin (ZITHROMAX) 250 MG tablet Day 1: Take 2 daily. Days 2-5: Take 1 daily. 6 tablet 0  . butalbital-acetaminophen-caffeine (FIORICET, ESGIC) 50-325-40 MG tablet TAKE 1 TABLET BY MOUTH TWICE DAILY AS NEEDED FOR HEADACHE 14 tablet 5  . fluticasone (FLONASE) 50 MCG/ACT nasal spray 2 sprays each nostril once a day    . hydrochlorothiazide (HYDRODIURIL) 25 MG tablet TAKE 1 TABLET BY MOUTH ONCE DAILY 90 tablet PRN  . ibuprofen (ADVIL,MOTRIN) 800 MG tablet Take 1 tablet (800 mg total) by mouth every 8 (eight) hours as needed. 30 tablet 0  . Multiple Vitamin (MULTIVITAMIN) tablet Take 1 tablet by mouth daily.    . tamsulosin (FLOMAX) 0.4 MG CAPS capsule Take 1 capsule (0.4 mg total) by mouth daily. (Patient not taking: Reported on 05/30/2016) 30 capsule 3  . traMADol (ULTRAM) 50 MG tablet Take 1 tablet (50 mg total) by mouth every 6 (six) hours as needed. (Patient not taking: Reported on 05/30/2016)  20 tablet 0   No facility-administered medications prior to visit.     Allergies:  Allergies  Allergen Reactions  . Aspartame And Phenylalanine Swelling  . Asa [Aspirin] Hives  . Penicillins Hives  . Skelaxin [Metaxalone] Nausea And Vomiting    Social History   Social History  . Marital status: Married    Spouse name: N/A  . Number of children: N/A  . Years of education: N/A   Occupational History  . Not on file.   Social History Main Topics  . Smoking status: Former Smoker    Quit date: 08/14/2004  . Smokeless tobacco: Never Used  . Alcohol use No  . Drug use: No  . Sexual activity: Not on file   Other Topics Concern  . Not on file   Social History Narrative  . No narrative on file    Family History  Problem Relation Age of Onset  . Heart disease Mother   . Hypertension Mother   . Cancer Neg Hx      Review of Systems:  See HPI for pertinent ROS. All other ROS negative.    Physical Exam: Blood pressure (!) 140/102, pulse 70, temperature 98 F (36.7 C), temperature source Oral, resp. rate 16, weight 161 lb 12.8 oz (73.4 kg), last menstrual period 04/16/2012, SpO2 98 %., Body mass index is 23.[place]  kg/m. General: WNWD Female. Appears in no acute distress. Neck: Supple. No thyromegaly. No lymphadenopathy. Lungs: Clear bilaterally to auscultation without wheezes, rales, or rhonchi. Breathing is unlabored. Heart: RRR with S1 S2. No murmurs, rubs, or gallops. Abdomen: Soft, non-tender, non-distended with normoactive bowel sounds. No hepatomegaly. No rebound/guarding. No obvious abdominal masses. Musculoskeletal:  Strength and tone normal for age. No costophrenic angle tenderness with percussion bilaterally. Extremities/Skin: Warm and dry.  Neuro: Alert and oriented X 3. Moves all extremities spontaneously. Gait is normal. CNII-XII grossly in tact. Psych:  Responds to questions appropriately with a normal affect.   Results for orders placed or performed in visit on  05/30/16  Urinalysis, Routine w reflex microscopic  Result Value Ref Range   Color, Urine YELLOW YELLOW   APPearance CLEAR CLEAR   Specific Gravity, Urine 1.025 1.001 - 1.035   pH 5.5 5.0 - 8.0   Glucose, UA NEGATIVE NEGATIVE   Bilirubin Urine NEGATIVE NEGATIVE   Ketones, ur NEGATIVE NEGATIVE   Hgb urine dipstick 2+ (A) NEGATIVE   Protein, ur NEGATIVE NEGATIVE   Nitrite NEGATIVE NEGATIVE   Leukocytes, UA NEGATIVE NEGATIVE  Urine Microscopic  Result Value Ref Range   WBC, UA 0-5 <=5 WBC/HPF   RBC / HPF 10-20 (A) <=2 RBC/HPF   Squamous Epithelial / LPF 0-5 <=5 HPF   Bacteria, UA FEW (A) NONE SEEN HPF   Crystals NONE SEEN NONE SEEN HPF   Casts NONE SEEN NONE SEEN LPF   Yeast NONE SEEN NONE SEEN HPF     ASSESSMENT AND PLAN:  51 y.o. year old female with  1. Hematuria, unspecified type Discussed urinalysis results. Refer to urology for further evaluation. - Urinalysis, Routine w reflex microscopic - Ambulatory referral to Urology  2. Essential hypertension Blood pressure has been elevated at visits regarding her foot. It is reading 140/102 today. She is currently on HCTZ 25 mg daily which she will continue. Add Norvasc 5 mg daily to this.  Will have her return for follow-up visit in 2 weeks to follow-up blood pressure. - amLODipine (NORVASC) 5 MG tablet; Take 1 tablet (5 mg total) by mouth daily.  Dispense: 90 tablet; Refill: 3   Signed, [address] [place], [place], [facility] 05/31/2016 7:35 AM

## 2016-06-04 ENCOUNTER — Ambulatory Visit: Payer: 59 | Admitting: Physical Therapy

## 2016-06-04 DIAGNOSIS — R262 Difficulty in walking, not elsewhere classified: Secondary | ICD-10-CM | POA: Diagnosis not present

## 2016-06-04 DIAGNOSIS — M6281 Muscle weakness (generalized): Secondary | ICD-10-CM

## 2016-06-04 DIAGNOSIS — M25572 Pain in left ankle and joints of left foot: Secondary | ICD-10-CM

## 2016-06-04 DIAGNOSIS — N201 Calculus of ureter: Secondary | ICD-10-CM | POA: Diagnosis not present

## 2016-06-04 DIAGNOSIS — R310 Gross hematuria: Secondary | ICD-10-CM | POA: Diagnosis not present

## 2016-06-05 NOTE — Therapy (Signed)
[facility] [address] [place], [address] Phone: [phone]   Fax:  [phone]  Physical Therapy Treatment  Patient Details  Name: Jennifer Castaneda MRN: [id number] Date of Birth: 29-Mar-1966 Referring Provider: Dr. Sharlotte Alamo  Encounter Date: 06/04/2016      PT End of Session - 06/04/16 1556    Visit Number 22   Number of Visits 27   Date for PT Re-Evaluation 06/05/16   PT Start Time 3220   PT Stop Time 1639   PT Time Calculation (min) 45 min   Activity Tolerance Patient tolerated treatment well   Behavior During Therapy [facility] for tasks assessed/performed      Past Medical History:  Diagnosis Date  . Allergy   . History of nephrolithiasis   . Hypertension   . Migraine headache     Past Surgical History:  Procedure Laterality Date  . BREAST SURGERY     cyst  . CESAREAN SECTION    . NASAL SINUS SURGERY      There were no vitals filed for this visit.      Subjective Assessment - 06/04/16 1557    Subjective Pt feels her ankle is not as tight as it was and that she is walking better after dry needling but her pain level is the same. Pt reports she was doing good the rest of Thursday and Friday, but was not as good on Saturday and Sunday. Pt feels like she "can't adjust her foot". Pt states she tried using the golf ball on Sunday but stopped because it was "too much".    Patient is accompained by: Family member   Pertinent History personal factors affecting rehab: chronic condition, significant pain, weakness, not able to work, frustration/emotional stress;    Limitations Standing;Walking   How long can you stand comfortably? 10 min   How long can you walk comfortably? <300 feet with B axillary crutches   Diagnostic tests X-rays were repeated on 1/29 which showed calcaneocuboid joint  fracture more stable than previous films; fibula fracture incorporating as well;    Currently in Pain?  Yes   Pain Score 3    Pain Location Ankle   Pain Orientation Left   Pain Descriptors / Indicators Aching   Pain Type Chronic pain   Pain Onset More than a month ago     Objective:  Dry needling to L peroneus brevis, L posterior tibialis, and L extensor digitorum to decrease pain and stiffness associated with TrP. Pt tolerated treatment but required distraction throughout. (No charge)  Ankle AP mob, grade III, 3x30 sec, to increase ankle dorsiflexion to allow for initial contact and initial swing phase of gait to prevent foot drag and hip circumduction when walking. Pt reported grip around 5th metatarsal was uncomfortable, moved hand grip proximal to avoid pressure. Pt reported no pain with mobs. Pt appeared to walk with increased ankle ROM and WB after manual therapy.   Foot doming exercise, pt instructed to stand with feet hip width apart and to "lift the arches of your feet". 5x. Progressed to foot doming with big toe extension to increase intrinsic foot muscle activation to restore ankle motion with walking. 5x. Pt reported no pain. Pt required cuing to "lift the arches, hold, and lift the big toe".   Toe extension exercise, pt instructed to bring L foot behind and lift the plantar surface of the foot while keeping the toes in contact with the floor. To  stretch the intrinsic foot muscles and decrease ankle stiffness. 15x                             PT Education - 06/04/16 1601    Education provided Yes   Education Details HEP, transition back to regular workout routine   Person(s) Educated Patient   Methods Explanation;Demonstration   Comprehension Verbalized understanding;Returned demonstration             PT Long Term Goals - 05/08/16 1525      PT LONG TERM GOAL #1   Title Patient will be independent in home exercise program to improve strength/mobility for better functional independence with ADLs.   Time 4   Period Weeks   Status On-going     PT  LONG TERM GOAL #2   Title Patient will increase 10 meter walk test to >1.39ms as to improve gait speed for better community ambulation and to reduce fall risk.   Time 4   Period Weeks   Status Partially Met     PT LONG TERM GOAL #3   Title Patient will report a worst pain of 3/10 on VAS in  left ankle/foot           to improve tolerance with ADLs and reduced symptoms with activities.    Baseline 9/10   Time 4   Period Weeks   Status Not Met     PT LONG TERM GOAL #4   Title Patient will increase lower extremity functional scale to >60/80 to demonstrate improved functional mobility and increased tolerance with ADLs.    Time 4   Period Weeks   Status Partially Met     PT LONG TERM GOAL #5   Title patient will increase LLE ankle ROM: DF: 5 degrees, PF: 60 degrees, IV: 25 degrees, EV: 10 degrees for better functional ROM for standing/walking tasks.    Time 4   Period Weeks   Status Achieved               Plan - 06/04/16 1604    Clinical Impression Statement Pt presents with decreased L ankle ROM, TrP in L LE musculature and difficulty with amb. Dry needling and ankle mobilizations increased ankle ROM and decreased stiffness with walking. Introduced foot doming and toe stretch exercise. At next visit introduce ankle strengthening exercises using machines.      Rehab Potential Fair   Clinical Impairments Affecting Rehab Potential positive: young in age, family support; negative: chronic pain, possible CRPS; patient's clinical presentation is evolving due to pain fluctation and limited mobility;    PT Frequency 2x / week   PT Duration 4 weeks   PT Treatment/Interventions ADLs/Self Care Home Management;Aquatic Therapy;Cryotherapy;Electrical Stimulation;Gait training;DME Instruction;Ultrasound;Moist Heat;Iontophoresis 44mml Dexamethasone;Stair training;Functional mobility training;Therapeutic activities;Therapeutic exercise;Balance training;Neuromuscular re-education;Manual  techniques;Patient/family education;Passive range of motion;Dry needling;Energy conservation;Taping   PT Next Visit Plan ROM/strength, manual therapy, stabilization exercise   PT Home Exercise Plan reinforced;    Consulted and Agree with Plan of Care Patient      Patient will benefit from skilled therapeutic intervention in order to improve the following deficits and impairments:  Pain, Decreased mobility, Decreased endurance, Decreased range of motion, Decreased strength, Hypomobility, Impaired flexibility, Difficulty walking, Decreased balance  Visit Diagnosis: Pain in left ankle and joints of left foot  Muscle weakness (generalized)  Difficulty in walking, not elsewhere classified     Problem List Patient Active Problem List   Diagnosis  Date Noted  . Hypertension   . Migraine headache   . History of nephrolithiasis   . Allergy    Manfred Arch, SPT Fisher,Benjamin PT DPT 06/05/2016, 11:04 AM  [facility] [address], [place], [address] Phone: [phone]   Fax:  [phone]  Name: SHARAH FINNELL MRN: [id number] Date of Birth: [birth year]

## 2016-06-07 ENCOUNTER — Ambulatory Visit: Payer: 59 | Admitting: Physical Therapy

## 2016-06-07 DIAGNOSIS — M25572 Pain in left ankle and joints of left foot: Secondary | ICD-10-CM | POA: Diagnosis not present

## 2016-06-07 DIAGNOSIS — R262 Difficulty in walking, not elsewhere classified: Secondary | ICD-10-CM

## 2016-06-07 DIAGNOSIS — M6281 Muscle weakness (generalized): Secondary | ICD-10-CM | POA: Diagnosis not present

## 2016-06-07 NOTE — Therapy (Signed)
[facility] [address] [place], [address] Phone: [phone]   Fax:  [phone]  Physical Therapy Treatment  Patient Details  Name: Jennifer Castaneda MRN: [id number] Date of Birth: May 22, 1965 Referring Provider: Dr. Sharlotte Alamo  Encounter Date: 06/07/2016      PT End of Session - 06/07/16 1608    Visit Number 23   Number of Visits 27   Date for PT Re-Evaluation 06/05/16   PT Start Time 6073   PT Stop Time 1631   PT Time Calculation (min) 38 min   Activity Tolerance Patient tolerated treatment well   Behavior During Therapy [facility] for tasks assessed/performed      Past Medical History:  Diagnosis Date  . Allergy   . History of nephrolithiasis   . Hypertension   . Migraine headache     Past Surgical History:  Procedure Laterality Date  . BREAST SURGERY     cyst  . CESAREAN SECTION    . NASAL SINUS SURGERY      There were no vitals filed for this visit.      Subjective Assessment - 06/07/16 1600    Subjective Pt states her ankle feels a little better, was able to take her dog for a walk around the neighborhood for the first time. Pt states she has been actively trying to use her L leg "like it's a normal leg".  Pt states she occasionally experiences L knee pain, that has gotten slightly better since the fall, but continues to be a concern for her.    Patient is accompained by: Family member   Pertinent History personal factors affecting rehab: chronic condition, significant pain, weakness, not able to work, frustration/emotional stress;    Limitations Standing;Walking   How long can you stand comfortably? 10 min   How long can you walk comfortably? <300 feet with B axillary crutches   Diagnostic tests X-rays were repeated on 1/29 which showed calcaneocuboid joint  fracture more stable than previous films; fibula fracture incorporating as well;    Currently in Pain? Yes   Pain Score 2     Pain Location Ankle   Pain Orientation Left   Pain Descriptors / Indicators Aching   Pain Type Chronic pain   Pain Onset More than a month ago      Objective:  Dry needling to L peroneus longus/brevis, lateral gastroc, to decrease pain with TrP and decrease muscle tension to increase ankle ROM. (No charge)   Heel raises on 6" step, with heels off of step, into full ankle dorsiflexion and full ankle plantarflexion. To increase ankle strength within full ROM. 10x. Pt cued to perform while keeping upright posture, avoid leaning trunk back to compensate.  Single leg deadlift on L, 15x, to increase L ankle stability, proprioception and strength to help with return to pain-free walking. Pt performed first three reps with hand on rail for support, pt then progressed to hands free with arms straight in front. Pt performed with L knee straight, hips flexed to 90 deg. Pt reported no pain. Pt appeared to have a challenge with keeping single leg balance and controlling return to starting position.                            PT Education - 06/07/16 1607    Education provided Yes   Education Details HEP   Person(s) Educated Patient   Methods Explanation;Demonstration  Comprehension Verbalized understanding;Returned demonstration             PT Long Term Goals - 05/08/16 1525      PT LONG TERM GOAL #1   Title Patient will be independent in home exercise program to improve strength/mobility for better functional independence with ADLs.   Time 4   Period Weeks   Status On-going     PT LONG TERM GOAL #2   Title Patient will increase 10 meter walk test to >1.20ms as to improve gait speed for better community ambulation and to reduce fall risk.   Time 4   Period Weeks   Status Partially Met     PT LONG TERM GOAL #3   Title Patient will report a worst pain of 3/10 on VAS in  left ankle/foot           to improve tolerance with ADLs and reduced symptoms with  activities.    Baseline 9/10   Time 4   Period Weeks   Status Not Met     PT LONG TERM GOAL #4   Title Patient will increase lower extremity functional scale to >60/80 to demonstrate improved functional mobility and increased tolerance with ADLs.    Time 4   Period Weeks   Status Partially Met     PT LONG TERM GOAL #5   Title patient will increase LLE ankle ROM: DF: 5 degrees, PF: 60 degrees, IV: 25 degrees, EV: 10 degrees for better functional ROM for standing/walking tasks.    Time 4   Period Weeks   Status Achieved               Plan - 06/07/16 1610    Clinical Impression Statement Pt presents with decreased L ankle ROM, and difficulty ascending/descending stairs. Dry needling decreased L ankle stiffness and increased ROM with walking. Introduced standing exercises to retrain L ankle proprioception, strength and endurance.  Pt presents with concerns about knee pain that has not been fully addressed.    Rehab Potential Fair   Clinical Impairments Affecting Rehab Potential positive: young in age, family support; negative: chronic pain, possible CRPS; patient's clinical presentation is evolving due to pain fluctation and limited mobility;    PT Frequency 2x / week   PT Duration 4 weeks   PT Treatment/Interventions ADLs/Self Care Home Management;Aquatic Therapy;Cryotherapy;Electrical Stimulation;Gait training;DME Instruction;Ultrasound;Moist Heat;Iontophoresis 423mml Dexamethasone;Stair training;Functional mobility training;Therapeutic activities;Therapeutic exercise;Balance training;Neuromuscular re-education;Manual techniques;Patient/family education;Passive range of motion;Dry needling;Energy conservation;Taping   PT Next Visit Plan ROM/strength, manual therapy, stabilization exercise   PT Home Exercise Plan reinforced;    Consulted and Agree with Plan of Care Patient      Patient will benefit from skilled therapeutic intervention in order to improve the following deficits  and impairments:  Pain, Decreased mobility, Decreased endurance, Decreased range of motion, Decreased strength, Hypomobility, Impaired flexibility, Difficulty walking, Decreased balance  Visit Diagnosis: Pain in left ankle and joints of left foot  Muscle weakness (generalized)  Difficulty in walking, not elsewhere classified     Problem List Patient Active Problem List   Diagnosis Date Noted  . Hypertension   . Migraine headache   . History of nephrolithiasis   . Allergy    TrManfred ArchSPT Quoc Tome PT DPT 06/07/2016, 5:18 PM  [facility] [address]hone: [phone] Fax:  [phone]: Jennifer STEGNERRN: [id number] of Birth: [birth year]

## 2016-06-11 ENCOUNTER — Ambulatory Visit: Payer: 59 | Admitting: Physical Therapy

## 2016-06-11 DIAGNOSIS — M6281 Muscle weakness (generalized): Secondary | ICD-10-CM | POA: Diagnosis not present

## 2016-06-11 DIAGNOSIS — R262 Difficulty in walking, not elsewhere classified: Secondary | ICD-10-CM | POA: Diagnosis not present

## 2016-06-11 DIAGNOSIS — S92022D Displaced fracture of anterior process of left calcaneus, subsequent encounter for fracture with routine healing: Secondary | ICD-10-CM | POA: Diagnosis not present

## 2016-06-11 DIAGNOSIS — M25572 Pain in left ankle and joints of left foot: Secondary | ICD-10-CM | POA: Diagnosis not present

## 2016-06-11 NOTE — Therapy (Signed)
[facility] [address] [place], [address] Phone: [phone]   Fax:  [phone]  Physical Therapy Treatment  Patient Details  Name: Jennifer Castaneda MRN: [id number] Date of Birth: [birth year] Referring Provider: Dr. Sharlotte Alamo  Encounter Date: 06/11/2016      PT End of Session - 06/11/16 1549    Visit Number 24   Number of Visits 27   Date for PT Re-Evaluation 07/03/16   PT Start Time 7048   PT Stop Time 1545   PT Time Calculation (min) 29 min   Activity Tolerance Patient tolerated treatment well   Behavior During Therapy [facility] for tasks assessed/performed      Past Medical History:  Diagnosis Date  . Allergy   . History of nephrolithiasis   . Hypertension   . Migraine headache     Past Surgical History:  Procedure Laterality Date  . BREAST SURGERY     cyst  . CESAREAN SECTION    . NASAL SINUS SURGERY      There were no vitals filed for this visit.      Subjective Assessment - 06/11/16 1548    Subjective Pt feels she is going "in the right direction", is concerned about incr. walking when she returns to work.   Patient is accompained by: Family member   Pertinent History personal factors affecting rehab: chronic condition, significant pain, weakness, not able to work, frustration/emotional stress;    Limitations Standing;Walking   How long can you stand comfortably? 10 min   How long can you walk comfortably? <300 feet with B axillary crutches   Diagnostic tests X-rays were repeated on 1/29 which showed calcaneocuboid joint  fracture more stable than previous films; fibula fracture incorporating as well;    Currently in Pain? Yes   Pain Score 1    Pain Location Ankle   Pain Orientation Left   Pain Onset More than a month ago            Objective: 6MWT 950', noted decr. DF after 2nd min of gait. Pt reported incr. Pain at this time but no further change in symptoms  with gait.  Partial lunges - extensive exploration of performance of this, including noting incr. Back activation with performance of this. Pt able to correct with cuing to avoid excessive painful DF on L foot with performance.  Stand to floor x10, pt prefers unilateral performance  able to perform B but only with heels raised. Discussed alternate strategies which pt will work on before next session to find most appropriate strategy.                    PT Education - 06/11/16 1549    Education provided Yes   Education Details incr. walking at home to prepare for return to work, lunge   [employer]) Educated Patient   Methods Explanation;Demonstration   Comprehension Verbalized understanding;Returned demonstration             PT Long Term Goals - 05/08/16 1525      PT LONG TERM GOAL #1   Title Patient will be independent in home exercise program to improve strength/mobility for better functional independence with ADLs.   Time 4   Period Weeks   Status On-going     PT LONG TERM GOAL #2   Title Patient will increase 10 meter walk test to >1.19ms as to improve gait speed for better community ambulation and to reduce  fall risk.   Time 4   Period Weeks   Status Partially Met     PT LONG TERM GOAL #3   Title Patient will report a worst pain of 3/10 on VAS in  left ankle/foot           to improve tolerance with ADLs and reduced symptoms with activities.    Baseline 9/10   Time 4   Period Weeks   Status Not Met     PT LONG TERM GOAL #4   Title Patient will increase lower extremity functional scale to >60/80 to demonstrate improved functional mobility and increased tolerance with ADLs.    Time 4   Period Weeks   Status Partially Met     PT LONG TERM GOAL #5   Title patient will increase LLE ankle ROM: DF: 5 degrees, PF: 60 degrees, IV: 25 degrees, EV: 10 degrees for better functional ROM for standing/walking tasks.    Time 4   Period Weeks   Status Achieved                Plan - 06/11/16 1550    Clinical Impression Statement Pt has made definite progress in gait with decr. difficulty descending stairs, note continued challenge after 2 min walking. Incr. pain in little toe abductor with gait. Pt also reports back pain with lunge onto R foot, able to correct with cuing. 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   Rehab Potential Fair   Clinical Impairments Affecting Rehab Potential positive: young in age, family support; negative: chronic pain, possible CRPS; patient's clinical presentation is evolving due to pain fluctation and limited mobility;    PT Frequency 2x / week   PT Duration 4 weeks   PT Treatment/Interventions ADLs/Self Care Home Management;Aquatic Therapy;Cryotherapy;Electrical Stimulation;Gait training;DME Instruction;Ultrasound;Moist Heat;Iontophoresis 55m/ml Dexamethasone;Stair training;Functional mobility training;Therapeutic activities;Therapeutic exercise;Balance training;Neuromuscular re-education;Manual techniques;Patient/family education;Passive range of motion;Dry needling;Energy conservation;Taping   PT Next Visit Plan ROM/strength, manual therapy, stabilization exercise   PT Home Exercise Plan reinforced;    Consulted and Agree with Plan of Care Patient      Patient will benefit from skilled therapeutic intervention in order to improve the following deficits and impairments:  Pain, Decreased mobility, Decreased endurance, Decreased range of motion, Decreased strength, Hypomobility, Impaired flexibility, Difficulty walking, Decreased balance  Visit Diagnosis: Difficulty in walking, not elsewhere classified     Problem List Patient Active Problem List   Diagnosis Date Noted  . Hypertension   . Migraine headache   . History of nephrolithiasis   . Allergy     Demaurion Dicioccio PT DPT 06/11/2016, 3:54 PM  [facility] [address] [place] [address]Phone: [phone]  Fax:  [phone] Name: Jennifer RIZZOLOMRN: [id number] of Birth: 706/30/1967

## 2016-06-12 DIAGNOSIS — R319 Hematuria, unspecified: Secondary | ICD-10-CM | POA: Diagnosis not present

## 2016-06-12 DIAGNOSIS — N201 Calculus of ureter: Secondary | ICD-10-CM | POA: Diagnosis not present

## 2016-06-12 DIAGNOSIS — N200 Calculus of kidney: Secondary | ICD-10-CM | POA: Diagnosis not present

## 2016-06-13 ENCOUNTER — Ambulatory Visit: Payer: 59 | Admitting: Physical Therapy

## 2016-06-13 DIAGNOSIS — M25572 Pain in left ankle and joints of left foot: Secondary | ICD-10-CM

## 2016-06-13 DIAGNOSIS — R262 Difficulty in walking, not elsewhere classified: Secondary | ICD-10-CM | POA: Diagnosis not present

## 2016-06-13 DIAGNOSIS — M6281 Muscle weakness (generalized): Secondary | ICD-10-CM | POA: Diagnosis not present

## 2016-06-13 NOTE — Therapy (Addendum)
[facility] [address], [place], [address] Phone: [phone]   Fax:  [phone]  Physical Therapy Treatment  Patient Details  Name: JANAVIA ROTTMAN MRN: [id number] Date of Birth: 05-Dec-1965 Referring Provider: Dr. Sharlotte Alamo  Encounter Date: 06/13/2016      PT End of Session - 06/13/16 1149    Visit Number 25   Number of Visits 27   Date for PT Re-Evaluation 07/03/16   PT Start Time 1108   PT Stop Time 1131   PT Time Calculation (min) 23 min   Activity Tolerance Patient tolerated treatment well   Behavior During Therapy [facility] for tasks assessed/performed      Past Medical History:  Diagnosis Date  . Allergy   . History of nephrolithiasis   . Hypertension   . Migraine headache     Past Surgical History:  Procedure Laterality Date  . BREAST SURGERY     cyst  . CESAREAN SECTION    . NASAL SINUS SURGERY      There were no vitals filed for this visit.      Subjective Assessment - 06/13/16 1149    Subjective Pt   Patient is accompained by: Family member   Pertinent History personal factors affecting rehab: chronic condition, significant pain, weakness, not able to work, frustration/emotional stress;    Limitations Standing;Walking   How long can you stand comfortably? 10 min   How long can you walk comfortably? <300 feet with B axillary crutches   Diagnostic tests X-rays were repeated on 1/29 which showed calcaneocuboid joint  fracture more stable than previous films; fibula fracture incorporating as well;    Pain Onset More than a month ago                 Objective: STM performed on distal LE on posterior tib, peroneals, global intrinsic foot musculature. During this educated pt on the need to incr. Her overall conditioninig via incr. Walking, possibly going to the gym for some additional work.   Following this had pt perform manually assisted stretch for weighted  DF.                       PT Long Term Goals - 05/08/16 1525      PT LONG TERM GOAL #1   Title Patient will be independent in home exercise program to improve strength/mobility for better functional independence with ADLs.   Time 4   Period Weeks   Status On-going     PT LONG TERM GOAL #2   Title Patient will increase 10 meter walk test to >1.20ms as to improve gait speed for better community ambulation and to reduce fall risk.   Time 4   Period Weeks   Status Partially Met     PT LONG TERM GOAL #3   Title Patient will report a worst pain of 3/10 on VAS in  left ankle/foot           to improve tolerance with ADLs and reduced symptoms with activities.    Baseline 9/10   Time 4   Period Weeks   Status Not Met     PT LONG TERM GOAL #4   Title Patient will increase lower extremity functional scale to >60/80 to demonstrate improved functional mobility and increased tolerance with ADLs.    Time 4   Period Weeks   Status Partially Met     PT LONG  TERM GOAL #5   Title patient will increase LLE ankle ROM: DF: 5 degrees, PF: 60 degrees, IV: 25 degrees, EV: 10 degrees for better functional ROM for standing/walking tasks.    Time 4   Period Weeks   Status Achieved               Plan - 06/13/16 1150    Clinical Impression Statement Pt   Rehab Potential Fair   Clinical Impairments Affecting Rehab Potential positive: young in age, family support; negative: chronic pain, possible CRPS; patient's clinical presentation is evolving due to pain fluctation and limited mobility;    PT Frequency 2x / week   PT Duration 4 weeks   PT Treatment/Interventions ADLs/Self Care Home Management;Aquatic Therapy;Cryotherapy;Electrical Stimulation;Gait training;DME Instruction;Ultrasound;Moist Heat;Iontophoresis 42m/ml Dexamethasone;Stair training;Functional mobility training;Therapeutic activities;Therapeutic exercise;Balance training;Neuromuscular re-education;Manual  techniques;Patient/family education;Passive range of motion;Dry needling;Energy conservation;Taping   PT Next Visit Plan ROM/strength, manual therapy, stabilization exercise   PT Home Exercise Plan reinforced;    Consulted and Agree with Plan of Care Patient      Patient will benefit from skilled therapeutic intervention in order to improve the following deficits and impairments:  Pain, Decreased mobility, Decreased endurance, Decreased range of motion, Decreased strength, Hypomobility, Impaired flexibility, Difficulty walking, Decreased balance  Visit Diagnosis: Pain in left ankle and joints of left foot     Problem List Patient Active Problem List   Diagnosis Date Noted  . Hypertension   . Migraine headache   . History of nephrolithiasis   . Allergy     Fisher,Benjamin PT DPT 06/13/2016, 11:54 AM  [facility] [address] [place] [address]Phone: [phone]  Fax:  [phone] Name: JJAYCI ELLEFSONMRN: [id number] of Birth: 7Jan 23, 1967

## 2016-06-14 ENCOUNTER — Ambulatory Visit: Payer: 59 | Admitting: Family Medicine

## 2016-06-21 ENCOUNTER — Ambulatory Visit (INDEPENDENT_AMBULATORY_CARE_PROVIDER_SITE_OTHER): Payer: 59 | Admitting: Family Medicine

## 2016-06-21 VITALS — BP 150/100 | HR 68 | Temp 98.3°F | Resp 16 | Ht 70.0 in | Wt 165.0 lb

## 2016-06-21 DIAGNOSIS — R319 Hematuria, unspecified: Secondary | ICD-10-CM

## 2016-06-21 DIAGNOSIS — I10 Essential (primary) hypertension: Secondary | ICD-10-CM

## 2016-06-21 LAB — URINALYSIS, ROUTINE W REFLEX MICROSCOPIC
Bilirubin Urine: NEGATIVE
GLUCOSE, UA: NEGATIVE
HGB URINE DIPSTICK: NEGATIVE
Ketones, ur: NEGATIVE
LEUKOCYTES UA: NEGATIVE
NITRITE: NEGATIVE
PROTEIN: NEGATIVE
Specific Gravity, Urine: 1.02 (ref 1.001–1.035)
pH: 6 (ref 5.0–8.0)

## 2016-06-21 MED ORDER — LISINOPRIL 40 MG PO TABS: 40.0000 mg | ORAL_TABLET | Freq: Every day | ORAL | 3 refills | Status: DC

## 2016-06-21 NOTE — Progress Notes (Signed)
Subjective:    Patient ID: Jennifer Castaneda, female    DOB: 02-20-1966, 51 y.o.   MRN: [id number]  HPI Was seen by my partner and amlodipine was added to HCTZ for HTN and was referred to urology for gross hematuria for several weeks.  Saw urology 2/19 and CT scan was ordered.  I do not have the results but according to the patient everything was normal. However she continues to have persistent occasional gross hematuria. The urologist as scheduled a cystoscopy in May. The patient is extremely concerned and questions if there is any way this can be expedited. Also her blood pressure is no better since the addition of amlodipine. She denies any chest pain shortness of breath or dyspnea on exertion. Unfortunately today the urinalysis is completely clear Past Medical History:  Diagnosis Date  . Allergy   . History of nephrolithiasis   . Hypertension   . Migraine headache    Past Surgical History:  Procedure Laterality Date  . BREAST SURGERY     cyst  . CESAREAN SECTION    . NASAL SINUS SURGERY     Current Outpatient Prescriptions on File Prior to Visit  Medication Sig Dispense Refill  . amLODipine (NORVASC) 5 MG tablet Take 1 tablet (5 mg total) by mouth daily. 90 tablet 3  . butalbital-acetaminophen-caffeine (FIORICET, ESGIC) 50-325-40 MG tablet TAKE 1 TABLET BY MOUTH TWICE DAILY AS NEEDED FOR HEADACHE 14 tablet 5  . fluticasone (FLONASE) 50 MCG/ACT nasal spray 2 sprays each nostril once a day    . hydrochlorothiazide (HYDRODIURIL) 25 MG tablet TAKE 1 TABLET BY MOUTH ONCE DAILY 90 tablet PRN  . ibuprofen (ADVIL,MOTRIN) 800 MG tablet Take 1 tablet (800 mg total) by mouth every 8 (eight) hours as needed. 30 tablet 0  . Multiple Vitamin (MULTIVITAMIN) tablet Take 1 tablet by mouth daily.     No current facility-administered medications on file prior to visit.    Allergies  Allergen Reactions  . Aspartame And Phenylalanine Swelling  . Asa [Aspirin] Hives  . Penicillins Hives    . Skelaxin [Metaxalone] Nausea And Vomiting   Social History   Social History  . Marital status: Married    Spouse name: N/A  . Number of children: N/A  . Years of education: N/A   Occupational History  . Not on file.   Social History Main Topics  . Smoking status: Former Smoker    Quit date: 08/14/2004  . Smokeless tobacco: Never Used  . Alcohol use No  . Drug use: No  . Sexual activity: Not on file   Other Topics Concern  . Not on file   Social History Narrative  . No narrative on file                                                                      Review of Systems  All other systems reviewed and are negative.      Objective:   Physical Exam  Constitutional: She appears well-developed and well-nourished.  Cardiovascular: Normal rate, regular rhythm and normal heart sounds.   Pulmonary/Chest: Effort normal and breath sounds normal. No respiratory distress. She has no wheezes. She has no rales.  Abdominal: Soft. Bowel sounds are normal. She exhibits  no distension. There is no tenderness. There is no rebound and no guarding.  Vitals reviewed.         Assessment & Plan:  Benign essential HTN - Plan: COMPLETE METABOLIC PANEL WITH GFR, CBC with Differential/Platelet, lisinopril (PRINIVIL,ZESTRIL) 40 MG tablet  Hematuria, unspecified type  Discontinue amlodipine and replace with lisinopril 40 mg a day and recheck blood pressure in one week. Given the gross hematuria, I will check a CMP to monitor renal function. Urinalysis today is normal. I will inquire to see if the urologist can possibly expedite the cystoscopy a little bit sooner than 2 months from now to try to was placed the patient's fears and she is concerned that she may have some type of bladder cancer although her risk is extremely low

## 2016-06-21 NOTE — Addendum Note (Signed)
Addended by: Shary Decamp B on: 06/21/2016 02:46 PM   Modules accepted: Orders

## 2016-06-22 LAB — COMPLETE METABOLIC PANEL WITH GFR
ALT: 15 U/L (ref 6–29)
AST: 20 U/L (ref 10–35)
Albumin: 4.6 g/dL (ref 3.6–5.1)
Alkaline Phosphatase: 126 U/L (ref 33–130)
BUN: 11 mg/dL (ref 7–25)
CO2: 30 mmol/L (ref 20–31)
Calcium: 11.4 mg/dL — ABNORMAL HIGH (ref 8.6–10.4)
Chloride: 107 mmol/L (ref 98–110)
Creat: 0.68 mg/dL (ref 0.50–1.05)
GFR, Est African American: >89 mL/min (ref >=60)
GLUCOSE: 82 mg/dL (ref 70–99)
Potassium: 3.9 mmol/L (ref 3.5–5.3)
SODIUM: 144 mmol/L (ref 135–146)
Total Bilirubin: 0.5 mg/dL (ref 0.2–1.2)
Total Protein: 7.5 g/dL (ref 6.1–8.1)

## 2016-06-22 LAB — CBC WITH DIFFERENTIAL/PLATELET
BASOS ABS: 56 {cells}/uL (ref 0–200)
Basophils Relative: 1 %
EOS ABS: 168 {cells}/uL (ref 15–500)
Eosinophils Relative: 3 %
HCT: 39.8 % (ref 35.0–45.0)
HEMOGLOBIN: 13.1 g/dL (ref 12.0–15.0)
LYMPHS ABS: 2072 {cells}/uL (ref 850–3900)
Lymphocytes Relative: 37 %
MCH: 29.4 pg (ref 27.0–33.0)
MCHC: 32.9 g/dL (ref 32.0–36.0)
MCV: 89.4 fL (ref 80.0–100.0)
MPV: 10.4 fL (ref 7.5–12.5)
Monocytes Absolute: 448 cells/uL (ref 200–950)
Monocytes Relative: 8 %
NEUTROS ABS: 2856 {cells}/uL (ref 1500–7800)
NEUTROS PCT: 51 %
Platelets: 232 10*3/uL (ref 140–400)
RBC: 4.45 MIL/uL (ref 3.80–5.10)
RDW: 13.6 % (ref 11.0–15.0)
WBC: 5.6 10*3/uL (ref 3.8–10.8)

## 2016-06-26 ENCOUNTER — Other Ambulatory Visit: Payer: Self-pay | Admitting: Family Medicine

## 2016-07-12 ENCOUNTER — Ambulatory Visit: Payer: 59 | Admitting: *Deleted

## 2016-07-12 ENCOUNTER — Other Ambulatory Visit: Payer: 59

## 2016-07-12 VITALS — BP 128/78

## 2016-07-12 DIAGNOSIS — I10 Essential (primary) hypertension: Secondary | ICD-10-CM

## 2016-07-12 NOTE — Progress Notes (Signed)
Patient seen in office to re-check BP since medication change.   Patient is currently taking Lisinopril 40mg  PO QD.   Noted BP to be 128/78 in office.

## 2016-07-13 LAB — COMPREHENSIVE METABOLIC PANEL
ALT: 12 U/L (ref 6–29)
AST: 15 U/L (ref 10–35)
Albumin: 4.1 g/dL (ref 3.6–5.1)
Alkaline Phosphatase: 121 U/L (ref 33–130)
BUN: 14 mg/dL (ref 7–25)
CALCIUM: 10.8 mg/dL — AB (ref 8.6–10.4)
CO2: 26 mmol/L (ref 20–31)
Chloride: 106 mmol/L (ref 98–110)
Creat: 0.82 mg/dL (ref 0.50–1.05)
Glucose, Bld: 96 mg/dL (ref 70–99)
POTASSIUM: 4.3 mmol/L (ref 3.5–5.3)
Sodium: 140 mmol/L (ref 135–146)
TOTAL PROTEIN: 7.2 g/dL (ref 6.1–8.1)
Total Bilirubin: 0.4 mg/dL (ref 0.2–1.2)

## 2016-07-18 ENCOUNTER — Telehealth: Payer: Self-pay | Admitting: Family Medicine

## 2016-07-18 MED ORDER — AMLODIPINE BESYLATE 5 MG PO TABS: 5.0000 mg | ORAL_TABLET | Freq: Every day | ORAL | 0 refills | Status: DC

## 2016-07-18 NOTE — Telephone Encounter (Signed)
-----   Message from Susy Frizzle, MD sent at 07/16/2016  6:42 AM EDT ----- Calcium is better but still high.  Is she still taking hctz?  If so, stop and replace with amlodipine 5 mg poqday.  If not, add pth.

## 2016-07-18 NOTE — Telephone Encounter (Signed)
Told pt about lab results.  She is still taking HCTZ.  Told her to stop and sent Amlodipine Rx to pharmacy.  When do you want to see her back or repeat labs??

## 2016-07-19 ENCOUNTER — Telehealth: Payer: Self-pay | Admitting: Family Medicine

## 2016-07-19 NOTE — Telephone Encounter (Signed)
Pt is wanting to know why she was changed from norvasc to lisinopril.

## 2016-07-19 NOTE — Telephone Encounter (Signed)
2 weeks.  

## 2016-07-20 NOTE — Telephone Encounter (Signed)
Left pt message.  She was told to stop the HCTZ.  She is to continue to take the Lisinopril and now the amlodipine.

## 2016-07-20 NOTE — Telephone Encounter (Signed)
2 week appt made

## 2016-07-24 DIAGNOSIS — H524 Presbyopia: Secondary | ICD-10-CM | POA: Diagnosis not present

## 2016-07-24 DIAGNOSIS — H52223 Regular astigmatism, bilateral: Secondary | ICD-10-CM | POA: Diagnosis not present

## 2016-08-02 ENCOUNTER — Ambulatory Visit: Payer: Self-pay | Admitting: Family Medicine

## 2016-08-06 ENCOUNTER — Encounter: Payer: Self-pay | Admitting: Family Medicine

## 2016-08-06 ENCOUNTER — Ambulatory Visit (INDEPENDENT_AMBULATORY_CARE_PROVIDER_SITE_OTHER): Payer: 59 | Admitting: Family Medicine

## 2016-08-06 DIAGNOSIS — I10 Essential (primary) hypertension: Secondary | ICD-10-CM | POA: Diagnosis not present

## 2016-08-06 NOTE — Progress Notes (Signed)
Subjective:    Patient ID: Jennifer Castaneda, female    DOB: 1966/02/17, 51 y.o.   MRN: [id number]  HPI  06/21/16 Was seen by my partner and amlodipine was added to HCTZ for HTN and was referred to urology for gross hematuria for several weeks.  Saw urology 2/19 and CT scan was ordered.  I do not have the results but according to the patient everything was normal. However she continues to have persistent occasional gross hematuria. The urologist as scheduled a cystoscopy in May. The patient is extremely concerned and questions if there is any way this can be expedited. Also her blood pressure is no better since the addition of amlodipine. She denies any chest pain shortness of breath or dyspnea on exertion. Unfortunately today the urinalysis is completely clear.  At that time, my plan was: Discontinue amlodipine and replace with lisinopril 40 mg a day and recheck blood pressure in one week. Given the gross hematuria, I will check a CMP to monitor renal function. Urinalysis today is normal. I will inquire to see if the urologist can possibly expedite the cystoscopy a little bit sooner than 2 months from now to try to was placed the patient's fears and she is concerned that she may have some type of bladder cancer although her risk is extremely low  08/06/16 On the patient's last lab work, her calcium level was 11.4. Repeat calcium level was 10.8. I had the patient discontinue hydrochlorothiazide and continue lisinopril. We added amlodipine back. Her blood pressure today is well controlled at 122/74. She is tolerating the combination of amlodipine and lisinopril without difficulty. She is here today to recheck her calcium level as well as to get a PTH Past Medical History:  Diagnosis Date  . Allergy   . History of nephrolithiasis   . Hypertension   . Migraine headache    Past Surgical History:  Procedure Laterality Date  . BREAST SURGERY     cyst  . CESAREAN SECTION    . NASAL SINUS  SURGERY     Current Outpatient Prescriptions on File Prior to Visit  Medication Sig Dispense Refill  . amLODipine (NORVASC) 5 MG tablet Take 1 tablet (5 mg total) by mouth daily. 90 tablet 0  . butalbital-acetaminophen-caffeine (FIORICET, ESGIC) 50-325-40 MG tablet TAKE 1 TABLET BY MOUTH TWICE DAILY AS NEEDED FOR HEADACHE 14 tablet 5  . fluticasone (FLONASE) 50 MCG/ACT nasal spray 2 sprays each nostril once a day    . ibuprofen (ADVIL,MOTRIN) 800 MG tablet Take 1 tablet (800 mg total) by mouth every 8 (eight) hours as needed. 30 tablet 0  . lisinopril (PRINIVIL,ZESTRIL) 40 MG tablet Take 1 tablet (40 mg total) by mouth daily. 30 tablet 3  . Multiple Vitamin (MULTIVITAMIN) tablet Take 1 tablet by mouth daily.     No current facility-administered medications on file prior to visit.    Allergies  Allergen Reactions  . Aspartame And Phenylalanine Swelling  . Asa [Aspirin] Hives  . Penicillins Hives  . Skelaxin [Metaxalone] Nausea And Vomiting   Social History   Social History  . Marital status: Married    Spouse name: N/A  . Number of children: N/A  . Years of education: N/A   Occupational History  . Not on file.   Social History Main Topics  . Smoking status: Former Smoker    Quit date: 08/14/2004  . Smokeless tobacco: Never Used  . Alcohol use No  . Drug use: No  . Sexual activity:  Not on file   Other Topics Concern  . Not on file   Social History Narrative  . No narrative on file    Review of Systems  All other systems reviewed and are negative.      Objective:   Physical Exam  Constitutional: She appears well-developed and well-nourished.  Cardiovascular: Normal rate, regular rhythm and normal heart sounds.   Pulmonary/Chest: Effort normal and breath sounds normal. No respiratory distress. She has no wheezes. She has no rales.  Abdominal: Soft. Bowel sounds are normal. She exhibits no distension. There is no tenderness. There is no rebound and no guarding.    Vitals reviewed.         Assessment & Plan:  Hypercalcemia - Plan: COMPLETE METABOLIC PANEL WITH GFR, Parathyroid hormone, intact (no Ca)  Benign essential HTN  Patient was taking hydrochlorothiazide because of her history of kidney stones. I've had the patient discontinue hydrochlorothiazide because of hypercalcemia. Recheck serum calcium level today along with a PTH. If calcium has returned to normal, we will continue the patient on amlodipine and lisinopril. If calcium remains elevated and PTH level is elevated, we will work the patient up for hyperparathyroidism.

## 2016-08-07 LAB — COMPLETE METABOLIC PANEL WITH GFR
ALT: 12 U/L (ref 6–29)
AST: 17 U/L (ref 10–35)
Albumin: 4.2 g/dL (ref 3.6–5.1)
Alkaline Phosphatase: 121 U/L (ref 33–130)
BUN: 13 mg/dL (ref 7–25)
CHLORIDE: 107 mmol/L (ref 98–110)
CO2: 29 mmol/L (ref 20–31)
CREATININE: 0.74 mg/dL (ref 0.50–1.05)
Calcium: 10.8 mg/dL — ABNORMAL HIGH (ref 8.6–10.4)
GFR, Est Non African American: >89 mL/min (ref >=60)
Glucose, Bld: 97 mg/dL (ref 70–99)
POTASSIUM: 4.3 mmol/L (ref 3.5–5.3)
Sodium: 142 mmol/L (ref 135–146)
Total Bilirubin: 0.6 mg/dL (ref 0.2–1.2)
Total Protein: 7 g/dL (ref 6.1–8.1)

## 2016-08-07 LAB — PARATHYROID HORMONE, INTACT (NO CA): PTH: 68 pg/mL — ABNORMAL HIGH (ref 14–64)

## 2016-08-14 ENCOUNTER — Encounter: Payer: Self-pay | Admitting: Family Medicine

## 2016-08-14 DIAGNOSIS — R7989 Other specified abnormal findings of blood chemistry: Secondary | ICD-10-CM

## 2016-08-16 ENCOUNTER — Other Ambulatory Visit: Payer: Self-pay | Admitting: Podiatry

## 2016-08-16 DIAGNOSIS — M7672 Peroneal tendinitis, left leg: Secondary | ICD-10-CM | POA: Diagnosis not present

## 2016-08-16 DIAGNOSIS — S92022D Displaced fracture of anterior process of left calcaneus, subsequent encounter for fracture with routine healing: Secondary | ICD-10-CM

## 2016-08-16 DIAGNOSIS — S92215D Nondisplaced fracture of cuboid bone of left foot, subsequent encounter for fracture with routine healing: Secondary | ICD-10-CM | POA: Diagnosis not present

## 2016-08-20 ENCOUNTER — Other Ambulatory Visit: Payer: Self-pay | Admitting: Podiatry

## 2016-08-20 DIAGNOSIS — S92022D Displaced fracture of anterior process of left calcaneus, subsequent encounter for fracture with routine healing: Secondary | ICD-10-CM

## 2016-08-20 DIAGNOSIS — S92215D Nondisplaced fracture of cuboid bone of left foot, subsequent encounter for fracture with routine healing: Secondary | ICD-10-CM

## 2016-08-22 ENCOUNTER — Ambulatory Visit: Payer: 59

## 2016-08-31 ENCOUNTER — Ambulatory Visit
Admission: RE | Admit: 2016-08-31 | Discharge: 2016-08-31 | Disposition: A | Discharge status: Home / Self Care | Payer: 59 | Source: Ambulatory Visit | Attending: Podiatry | Admitting: Podiatry

## 2016-08-31 DIAGNOSIS — W109XXD Fall (on) (from) unspecified stairs and steps, subsequent encounter: Secondary | ICD-10-CM | POA: Insufficient documentation

## 2016-08-31 DIAGNOSIS — S92215D Nondisplaced fracture of cuboid bone of left foot, subsequent encounter for fracture with routine healing: Secondary | ICD-10-CM | POA: Insufficient documentation

## 2016-08-31 DIAGNOSIS — S92022D Displaced fracture of anterior process of left calcaneus, subsequent encounter for fracture with routine healing: Secondary | ICD-10-CM

## 2016-10-30 ENCOUNTER — Other Ambulatory Visit: Payer: Self-pay | Admitting: Family Medicine

## 2016-10-30 DIAGNOSIS — I10 Essential (primary) hypertension: Secondary | ICD-10-CM

## 2016-11-10 ENCOUNTER — Encounter: Payer: Self-pay | Admitting: Family Medicine

## 2016-11-12 ENCOUNTER — Other Ambulatory Visit: Payer: Self-pay | Admitting: Family Medicine

## 2016-11-12 MED ORDER — AZITHROMYCIN 250 MG PO TABS: ORAL_TABLET | ORAL | 0 refills | Status: DC

## 2016-12-07 DIAGNOSIS — Z87442 Personal history of urinary calculi: Secondary | ICD-10-CM | POA: Diagnosis not present

## 2016-12-07 DIAGNOSIS — E213 Hyperparathyroidism, unspecified: Secondary | ICD-10-CM | POA: Diagnosis not present

## 2016-12-11 DIAGNOSIS — E213 Hyperparathyroidism, unspecified: Secondary | ICD-10-CM | POA: Diagnosis not present

## 2017-03-04 DIAGNOSIS — E213 Hyperparathyroidism, unspecified: Secondary | ICD-10-CM | POA: Diagnosis not present

## 2017-03-06 DIAGNOSIS — E213 Hyperparathyroidism, unspecified: Secondary | ICD-10-CM | POA: Diagnosis not present

## 2017-03-11 DIAGNOSIS — E213 Hyperparathyroidism, unspecified: Secondary | ICD-10-CM | POA: Diagnosis not present

## 2017-03-11 DIAGNOSIS — Z87442 Personal history of urinary calculi: Secondary | ICD-10-CM | POA: Diagnosis not present

## 2017-04-30 DIAGNOSIS — E213 Hyperparathyroidism, unspecified: Secondary | ICD-10-CM | POA: Diagnosis not present

## 2017-05-07 DIAGNOSIS — Z79899 Other long term (current) drug therapy: Secondary | ICD-10-CM | POA: Diagnosis not present

## 2017-05-07 DIAGNOSIS — R5383 Other fatigue: Secondary | ICD-10-CM | POA: Diagnosis not present

## 2017-05-07 DIAGNOSIS — E213 Hyperparathyroidism, unspecified: Secondary | ICD-10-CM | POA: Diagnosis not present

## 2017-05-07 DIAGNOSIS — R4589 Other symptoms and signs involving emotional state: Secondary | ICD-10-CM | POA: Diagnosis not present

## 2017-05-07 DIAGNOSIS — E210 Primary hyperparathyroidism: Secondary | ICD-10-CM | POA: Diagnosis not present

## 2017-05-07 DIAGNOSIS — Z87442 Personal history of urinary calculi: Secondary | ICD-10-CM | POA: Diagnosis not present

## 2017-05-07 DIAGNOSIS — E041 Nontoxic single thyroid nodule: Secondary | ICD-10-CM | POA: Diagnosis not present

## 2017-05-07 DIAGNOSIS — D351 Benign neoplasm of parathyroid gland: Secondary | ICD-10-CM | POA: Diagnosis not present

## 2017-05-07 DIAGNOSIS — I10 Essential (primary) hypertension: Secondary | ICD-10-CM | POA: Diagnosis not present

## 2017-05-07 DIAGNOSIS — M898X9 Other specified disorders of bone, unspecified site: Secondary | ICD-10-CM | POA: Diagnosis not present

## 2017-05-13 ENCOUNTER — Encounter: Payer: Self-pay | Admitting: Family Medicine

## 2017-05-13 DIAGNOSIS — E213 Hyperparathyroidism, unspecified: Secondary | ICD-10-CM | POA: Insufficient documentation

## 2017-05-28 ENCOUNTER — Ambulatory Visit (INDEPENDENT_AMBULATORY_CARE_PROVIDER_SITE_OTHER): Payer: Self-pay | Admitting: Emergency Medicine

## 2017-05-28 VITALS — BP 122/65 | HR 105 | Temp 99.5°F | Wt 170.0 lb

## 2017-05-28 DIAGNOSIS — R6889 Other general symptoms and signs: Secondary | ICD-10-CM

## 2017-05-28 DIAGNOSIS — R509 Fever, unspecified: Secondary | ICD-10-CM

## 2017-05-28 LAB — POCT INFLUENZA A/B
Influenza A, POC: NEGATIVE
Influenza B, POC: NEGATIVE

## 2017-05-28 MED ORDER — OSELTAMIVIR PHOSPHATE 75 MG PO CAPS: 75.0000 mg | ORAL_CAPSULE | Freq: Two times a day (BID) | ORAL | 0 refills | Status: DC

## 2017-05-28 NOTE — Progress Notes (Signed)
Subjective:     Jennifer Castaneda is a 52 y.o. female who presents for evaluation of influenza like symptoms. Symptoms include chest congestion, chills, headache, myalgias, productive cough, sore throat and fever and have been present for 2 days. She has tried to alleviate the symptoms with acetaminophen and decongestents  with minimal relief. She denies nausea, vomiting, or diarrhea. High risk factors for influenza complications: none.  The following portions of the patient's history were reviewed and updated as appropriate: allergies.  Review of Systems Pertinent items are noted in HPI.     Objective:    BP 122/65   Pulse (!) 105   Temp 99.5 F (37.5 C)   Wt 170 lb (77.1 kg)   LMP 04/16/2012   SpO2 97%   BMI 24.39 kg/m  General appearance: alert, cooperative and appears stated age Head: Normocephalic, without obvious abnormality, atraumatic Eyes: negative Ears: normal TM's and external ear canals both ears Nose: mild congestion, sinus tenderness bilateral Throat: lips, mucosa, and tongue normal; teeth and gums normal Neck: no adenopathy Lungs: clear to auscultation bilaterally Heart: regular rate and rhythm Extremities: extremities normal, atraumatic, no cyanosis or edema Pulses: 2+ and symmetric    Assessment:    Influenza    Plan:    Supportive care with appropriate antipyretics and fluids. Educational material distributed and questions answered. Antivirals per orders. Follow up in 1 week or as needed. ER if symptoms worsen

## 2017-05-28 NOTE — Patient Instructions (Signed)

## 2017-06-10 DIAGNOSIS — E210 Primary hyperparathyroidism: Secondary | ICD-10-CM | POA: Diagnosis not present

## 2017-06-10 DIAGNOSIS — R5383 Other fatigue: Secondary | ICD-10-CM | POA: Diagnosis not present

## 2017-06-10 DIAGNOSIS — D351 Benign neoplasm of parathyroid gland: Secondary | ICD-10-CM | POA: Diagnosis not present

## 2017-06-10 DIAGNOSIS — Z880 Allergy status to penicillin: Secondary | ICD-10-CM | POA: Diagnosis not present

## 2017-06-10 DIAGNOSIS — I10 Essential (primary) hypertension: Secondary | ICD-10-CM | POA: Diagnosis not present

## 2017-06-10 DIAGNOSIS — Z87442 Personal history of urinary calculi: Secondary | ICD-10-CM | POA: Diagnosis not present

## 2017-06-19 ENCOUNTER — Ambulatory Visit (INDEPENDENT_AMBULATORY_CARE_PROVIDER_SITE_OTHER): Payer: Self-pay | Admitting: Nurse Practitioner

## 2017-06-19 VITALS — BP 122/74 | HR 92 | Temp 98.5°F | Wt 170.0 lb

## 2017-06-19 DIAGNOSIS — R059 Cough, unspecified: Secondary | ICD-10-CM

## 2017-06-19 DIAGNOSIS — J0100 Acute maxillary sinusitis, unspecified: Secondary | ICD-10-CM

## 2017-06-19 DIAGNOSIS — R05 Cough: Secondary | ICD-10-CM

## 2017-06-19 MED ORDER — DOXYCYCLINE HYCLATE 100 MG PO TABS: 100.0000 mg | ORAL_TABLET | Freq: Two times a day (BID) | ORAL | 0 refills | Status: AC

## 2017-06-19 MED ORDER — FLUTICASONE PROPIONATE 50 MCG/ACT NA SUSP: 2.0000 | Freq: Every day | NASAL | 0 refills | Status: DC

## 2017-06-19 MED ORDER — DEXTROMETHORPHAN HBR 15 MG/5ML PO SYRP: 10.0000 mL | ORAL_SOLUTION | Freq: Four times a day (QID) | ORAL | 0 refills | Status: AC | PRN

## 2017-06-19 NOTE — Progress Notes (Signed)
Subjective:  Jennifer Castaneda is a 52 y.o. female who presents for evaluation of possible sinusitis.  Symptoms include bilateral ear pressure/pain, cough described as productive of brown sputum, facial pain, nasal congestion, post nasal drip, sinus pressure and sinus pain.  Onset of symptoms was 2 weeks ago, and has been gradually worsening since that time.  Treatment to date:  patient seen for influenza, given Tamiflu and has taken OTC cough medicine.  High risk factors for influenza complications:  none.  Patient just had parathyroid surgery. Bandage in place to neck, site wnl, no drainage or redness.  Discomfort with coughing to surgical incision.  The following portions of the patient's history were reviewed and updated as appropriate:  allergies, current medications and past medical history.  Constitutional: positive for fatigue, negative for anorexia, chills and fevers Eyes: negative Ears, nose, mouth, throat, and face: positive for nasal congestion and sore throat, negative for ear drainage, earaches and hoarseness Respiratory: positive for cough, sputum and wheezing, negative for chronic bronchitis, dyspnea on exertion, pleurisy/chest pain, pneumonia and stridor Cardiovascular: negative Gastrointestinal: negative Neurological: positive for headaches, negative for coordination problems, dizziness, paresthesia, seizures, tremors and weakness Objective:  BP 122/74   Pulse 92   Temp 98.5 F (36.9 C)   Wt 170 lb (77.1 kg)   LMP 04/16/2012   SpO2 99%   BMI 24.39 kg/m  General appearance: alert, cooperative and fatigued Head: Normocephalic, without obvious abnormality, atraumatic Eyes: conjunctivae/corneas clear. PERRL, EOM's intact. Fundi benign. Ears: normal TM and external ear canal left ear and abnormal TM right ear - mucoid middle ear fluid Nose: no discharge, turbinates swollen, inflamed, moderate maxillary sinus tenderness bilateral, mild frontal sinus tenderness  bilateral Throat: lips, mucosa, and tongue normal; teeth and gums normal Lungs: clear to auscultation bilaterally Heart: regular rate and rhythm, S1, S2 normal, no murmur, click, rub or gallop Abdomen: soft, non-tender; bowel sounds normal; no masses,  no organomegaly Pulses: 2+ and symmetric Skin: Skin color, texture, turgor normal. No rashes or lesions Lymph nodes: cervical and submandibular nodes normal Neurologic: Grossly normal    Assessment:  sinusitis    Plan:  Discussed diagnosis and treatment of sinusitis. Educational material distributed and questions answered. Suggested symptomatic OTC remedies. Suggested brief course of Afrin or similar. Supportive care with appropriate antipyretics and fluids. Nasal saline spray for congestion. Doxycycline, Flonase and Tussin per orders. Nasal steroids per orders.

## 2017-06-19 NOTE — Patient Instructions (Addendum)
Sinusitis, Adult Sinusitis is soreness and inflammation of your sinuses. Sinuses are hollow spaces in the bones around your face. Your sinuses are located:  Around your eyes.  In the middle of your forehead.  Behind your nose.  In your cheekbones.  Your sinuses and nasal passages are lined with a stringy fluid (mucus). Mucus normally drains out of your sinuses. When your nasal tissues become inflamed or swollen, the mucus can become trapped or blocked so air cannot flow through your sinuses. This allows bacteria, viruses, and funguses to grow, which leads to infection. Sinusitis can develop quickly and last for 7?10 days (acute) or for more than 12 weeks (chronic). Sinusitis often develops after a cold. What are the causes? This condition is caused by anything that creates swelling in the sinuses or stops mucus from draining, including:  Allergies.  Asthma.  Bacterial or viral infection.  Abnormally shaped bones between the nasal passages.  Nasal growths that contain mucus (nasal polyps).  Narrow sinus openings.  Pollutants, such as chemicals or irritants in the air.  A foreign object stuck in the nose.  A fungal infection. This is rare.  What increases the risk? The following factors may make you more likely to develop this condition:  Having allergies or asthma.  Having had a recent cold or respiratory tract infection.  Having structural deformities or blockages in your nose or sinuses.  Having a weak immune system.  Doing a lot of swimming or diving.  Overusing nasal sprays.  Smoking.  What are the signs or symptoms? The main symptoms of this condition are pain and a feeling of pressure around the affected sinuses. Other symptoms include:  Upper toothache.  Earache.  Headache.  Bad breath.  Decreased sense of smell and taste.  A cough that may get worse at night.  Fatigue.  Fever.  Thick drainage from your nose. The drainage is often green and  it may contain pus (purulent).  Stuffy nose or congestion.  Postnasal drip. This is when extra mucus collects in the throat or back of the nose.  Swelling and warmth over the affected sinuses.  Sore throat.  Sensitivity to light.  How is this diagnosed? This condition is diagnosed based on symptoms, a medical history, and a physical exam. To find out if your condition is acute or chronic, your health care provider may:  Look in your nose for signs of nasal polyps.  Tap over the affected sinus to check for signs of infection.  View the inside of your sinuses using an imaging device that has a light attached (endoscope).  If your health care provider suspects that you have chronic sinusitis, you may also:  Be tested for allergies.  Have a sample of mucus taken from your nose (nasal culture) and checked for bacteria.  Have a mucus sample examined to see if your sinusitis is related to an allergy.  If your sinusitis does not respond to treatment and it lasts longer than 8 weeks, you may have an MRI or CT scan to check your sinuses. These scans also help to determine how severe your infection is. In rare cases, a bone biopsy may be done to rule out more serious types of fungal sinus disease. How is this treated? Treatment for sinusitis depends on the cause and whether your condition is chronic or acute. If a virus is causing your sinusitis, your symptoms will go away on their own within 10 days. You may be given medicines to relieve your symptoms,   including:  Topical nasal decongestants. They shrink swollen nasal passages and let mucus drain from your sinuses.  Antihistamines. These drugs block inflammation that is triggered by allergies. This can help to ease swelling in your nose and sinuses.  Topical nasal corticosteroids. These are nasal sprays that ease inflammation and swelling in your nose and sinuses.  Nasal saline washes. These rinses can help to get rid of thick mucus in  your nose.  If your condition is caused by bacteria, you will be given an antibiotic medicine. If your condition is caused by a fungus, you will be given an antifungal medicine. Surgery may be needed to correct underlying conditions, such as narrow nasal passages. Surgery may also be needed to remove polyps. Follow these instructions at home: Medicines  Take, use, or apply over-the-counter and prescription medicines only as told by your health care provider. These may include nasal sprays.  If you were prescribed an antibiotic medicine, take it as told by your health care provider. Do not stop taking the antibiotic even if you start to feel better. Hydrate and Humidify  Drink enough water to keep your urine clear or pale yellow. Staying hydrated will help to thin your mucus.  Use a cool mist humidifier to keep the humidity level in your home above 50%.  Inhale steam for 10-15 minutes, 3-4 times a day or as told by your health care provider. You can do this in the bathroom while a hot shower is running.  Limit your exposure to cool or dry air. Rest  Rest as much as possible.  Sleep with your head raised (elevated).  Make sure to get enough sleep each night. General instructions  Apply a warm, moist washcloth to your face 3-4 times a day or as told by your health care provider. This will help with discomfort.  Wash your hands often with soap and water to reduce your exposure to viruses and other germs. If soap and water are not available, use hand sanitizer.  Do not smoke. Avoid being around people who are smoking (secondhand smoke).  Keep all follow-up visits as told by your health care provider. This is important. Contact a health care provider if:  You have a fever.  Your symptoms get worse.  Your symptoms do not improve within 10 days. Get help right away if:  You have a severe headache.  You have persistent vomiting.  You have pain or swelling around your face or  eyes.  You have vision problems.  You develop confusion.  Your neck is stiff.  You have trouble breathing. This information is not intended to replace advice given to you by your health care provider. Make sure you discuss any questions you have with your health care provider. Document Released: 04/02/2005 Document Revised: 11/27/2015 Document Reviewed: 01/26/2015 Elsevier Interactive Patient Education  2018 [employer] Cough, Adult Coughing is a reflex that clears your throat and your airways. Coughing helps to heal and protect your lungs. It is normal to cough occasionally, but a cough that happens with other symptoms or lasts a long time may be a sign of a condition that needs treatment. A cough may last only 2-3 weeks (acute), or it may last longer than 8 weeks (chronic). What are the causes? Coughing is commonly caused by:  Breathing in substances that irritate your lungs.  A viral or bacterial respiratory infection.  Allergies.  Asthma.  Postnasal drip.  Smoking.  Acid backing up from the stomach into the esophagus (gastroesophageal reflux).    Certain medicines.  Chronic lung problems, including COPD (or rarely, lung cancer).  Other medical conditions such as heart failure.  Follow these instructions at home: Pay attention to any changes in your symptoms. Take these actions to help with your discomfort:  Take medicines only as told by your health care provider. ? If you were prescribed an antibiotic medicine, take it as told by your health care provider. Do not stop taking the antibiotic even if you start to feel better. ? Talk with your health care provider before you take a cough suppressant medicine.  Drink enough fluid to keep your urine clear or pale yellow.  If the air is dry, use a cold steam vaporizer or humidifier in your bedroom or your home to help loosen secretions.  Avoid anything that causes you to cough at work or at home.  If your cough is  worse at night, try sleeping in a semi-upright position.  Avoid cigarette smoke. If you smoke, quit smoking. If you need help quitting, ask your health care provider.  Avoid caffeine.  Avoid alcohol.  Rest as needed.  Contact a health care provider if:  You have new symptoms.  You cough up pus.  Your cough does not get better after 2-3 weeks, or your cough gets worse.  You cannot control your cough with suppressant medicines and you are losing sleep.  You develop pain that is getting worse or pain that is not controlled with pain medicines.  You have a fever.  You have unexplained weight loss.  You have night sweats. Get help right away if:  You cough up blood.  You have difficulty breathing.  Your heartbeat is very fast. This information is not intended to replace advice given to you by your health care provider. Make sure you discuss any questions you have with your health care provider. Document Released: 09/29/2010 Document Revised: 09/08/2015 Document Reviewed: 06/09/2014 Elsevier Interactive Patient Education  2018 Elsevier Inc.  

## 2017-06-21 ENCOUNTER — Telehealth: Payer: Self-pay | Admitting: Emergency Medicine

## 2017-06-21 NOTE — Telephone Encounter (Signed)
Contacted patient following up on visit. Per patient still having persistent cough still taking doxy and stated  cough syrup is not working. Instructed patient to follow up if not any better

## 2017-06-25 DIAGNOSIS — R05 Cough: Secondary | ICD-10-CM | POA: Diagnosis not present

## 2017-06-25 DIAGNOSIS — E892 Postprocedural hypoparathyroidism: Secondary | ICD-10-CM | POA: Diagnosis not present

## 2017-07-01 ENCOUNTER — Encounter: Payer: Self-pay | Admitting: Family Medicine

## 2017-09-17 ENCOUNTER — Ambulatory Visit (INDEPENDENT_AMBULATORY_CARE_PROVIDER_SITE_OTHER): Payer: Self-pay | Admitting: Family Medicine

## 2017-09-17 ENCOUNTER — Encounter: Payer: Self-pay | Admitting: Family Medicine

## 2017-09-17 VITALS — BP 130/90 | HR 71 | Temp 98.3°F | Wt 172.0 lb

## 2017-09-17 DIAGNOSIS — J3089 Other allergic rhinitis: Secondary | ICD-10-CM

## 2017-09-17 DIAGNOSIS — H9202 Otalgia, left ear: Secondary | ICD-10-CM

## 2017-09-17 MED ORDER — NEOMYCIN-POLYMYXIN-HC 1 % OT SOLN: 3.0000 [drp] | Freq: Four times a day (QID) | OTIC | 0 refills | Status: DC

## 2017-09-17 MED ORDER — LEVOCETIRIZINE DIHYDROCHLORIDE 5 MG PO TABS: 5.0000 mg | ORAL_TABLET | Freq: Every evening | ORAL | 1 refills | Status: DC

## 2017-09-17 MED ORDER — HYDROCORTISONE-ACETIC ACID 1-2 % OT SOLN: 4.0000 [drp] | Freq: Two times a day (BID) | OTIC | 0 refills | Status: DC | PRN

## 2017-09-17 MED ORDER — IPRATROPIUM BROMIDE 0.03 % NA SOLN: 2.0000 | Freq: Two times a day (BID) | NASAL | 0 refills | Status: DC

## 2017-09-17 NOTE — Progress Notes (Addendum)
Patient ID: Jennifer Castaneda, female    DOB: 1966-01-16, 52 y.o.   MRN: [id number]  PCP: Susy Frizzle, MD  Chief Complaint  Patient presents with  . choice-scratchy throat    Subjective:  HPI Jennifer Castaneda is a 52 y.o. female presents for evaluation left ear pain, throat discomfort, and congestion x 2 days. Left ear aches and is itchy. There is discomfort and itchiness inside the ear. Throat is scratchy oppose to painful. Mild non-productive cough. Previously prescribed Flonase although has not used it recently. She has taken OTC Coricidin without relief of symptoms. She has remained afebrile. Social History   Socioeconomic History  . Marital status: Married    Spouse name: Not on file  . Number of children: Not on file  . Years of education: Not on file  . Highest education level: Not on file  Occupational History  . Not on file  Social Needs  . Financial resource strain: Not on file  . Food insecurity:    Worry: Not on file    Inability: Not on file  . Transportation needs:    Medical: Not on file    Non-medical: Not on file  Tobacco Use  . Smoking status: Former Smoker    Last attempt to quit: 08/14/2004    Years since quitting: 13.1  . Smokeless tobacco: Never Used  Substance and Sexual Activity  . Alcohol use: No  . Drug use: No  . Sexual activity: Not on file  Lifestyle  . Physical activity:    Days per week: Not on file    Minutes per session: Not on file  . Stress: Not on file  Relationships  . Social connections:    Talks on phone: Not on file    Gets together: Not on file    Attends religious service: Not on file    Active member of club or organization: Not on file    Attends meetings of clubs or organizations: Not on file    Relationship status: Not on file  . Intimate partner violence:    Fear of current or ex partner: Not on file    Emotionally abused: Not on file    Physically abused: Not on file    Forced sexual activity:  Not on file  Other Topics Concern  . Not on file  Social History Narrative  . Not on file    Family History  Problem Relation Age of Onset  . Heart disease Mother   . Hypertension Mother   . Cancer Neg Hx    Review of Systems Pertinent negative listed in HPI  Patient Active Problem List   Diagnosis Date Noted  . Hyperparathyroidism ([facility])   . Hypertension   . Migraine headache   . History of nephrolithiasis   . Allergy     Allergies  Allergen Reactions  . Aspartame And Phenylalanine Swelling  . Phenylalanine Anaphylaxis and Swelling  . Asa [Aspirin] Hives  . Penicillins Hives  . Skelaxin [Metaxalone] Nausea And Vomiting    Prior to Admission medications   Medication Sig Start Date End Date Taking? Authorizing Provider  acetaminophen (TYLENOL) 500 MG tablet Take 500 mg by mouth.   Yes [provider]  amLODipine (NORVASC) 5 MG tablet Take 1 tablet (5 mg total) by mouth daily. 07/18/16  Yes Susy Frizzle, MD  fluticasone Asencion Islam) 50 MCG/ACT nasal spray 2 sprays each nostril once a day 08/01/11  Yes [provider]  ibuprofen (ADVIL,MOTRIN) 800 MG  tablet Take 1 tablet (800 mg total) by mouth every 8 (eight) hours as needed. 01/27/16  Yes Dorise Hiss C, PA-C  lisinopril (PRINIVIL,ZESTRIL) 40 MG tablet TAKE 1 TABLET BY MOUTH DAILY. 10/30/16  Yes Susy Frizzle, MD  Multiple Vitamin (MULTIVITAMIN) tablet Take 1 tablet by mouth daily.   Yes [provider]  fluticasone (FLONASE) 50 MCG/ACT nasal spray Place 2 sprays into both nostrils daily for 10 days. 06/19/17 06/29/17  Kara Dies, NP  oseltamivir (TAMIFLU) 75 MG capsule Take 1 capsule (75 mg total) by mouth 2 (two) times daily. Patient not taking: Reported on 06/19/2017 05/28/17   Barnet Glasgow, NP    Past Medical, Surgical Family and Social History reviewed and updated.    Objective:   Today's Vitals   09/17/17 1057  BP: 130/90  Pulse: 71  Temp: 98.3 F (36.8 C)  SpO2:  99%  Weight: 172 lb (78 kg)    Wt Readings from Last 3 Encounters:  09/17/17 172 lb (78 kg)  06/19/17 170 lb (77.1 kg)  05/28/17 170 lb (77.1 kg)    Physical Exam  Constitutional: She is oriented to person, place, and time. She appears well-developed and well-nourished.  HENT:  Head: Normocephalic and atraumatic.  Right Ear: External ear normal.  Left Ear: External ear normal.  Nose: Nose normal. No mucosal edema or rhinorrhea. Right sinus exhibits no maxillary sinus tenderness and no frontal sinus tenderness. Left sinus exhibits no maxillary sinus tenderness and no frontal sinus tenderness.  Mouth/Throat: Oropharynx is clear and moist. Mucous membranes are dry. Tonsils are 0 on the right. Tonsils are 0 on the left.  Neck: Normal range of motion. Neck supple.  Cardiovascular: Normal rate, regular rhythm, normal heart sounds and intact distal pulses.  Pulmonary/Chest: Effort normal and breath sounds normal.  Lymphadenopathy:    She has no cervical adenopathy.  Neurological: She is alert and oriented to person, place, and time.  Skin: Skin is warm and dry.  Psychiatric: She has a normal mood and affect. Her behavior is normal. Judgment and thought content normal.   Assessment & Plan:  1. Left ear pain, exam normal. 2. Non-seasonal allergic rhinitis due to other allergic trigger  Symptomatic treatment warranted. Suspect symptoms are stemming from environmental allergies. Treatment indicated below:   Meds ordered this encounter  Medications  . levocetirizine (XYZAL) 5 MG tablet    Sig: Take 1 tablet (5 mg total) by mouth every evening.    Dispense:  90 tablet    Refill:  1  . ipratropium (ATROVENT) 0.03 % nasal spray    Sig: Place 2 sprays into both nostrils 2 (two) times daily.    Dispense:  30 mL    Refill:  0  . DISCONTD: acetic acid-hydrocortisone (VOSOL-HC) OTIC solution    Sig: Place 4 drops into the left ear 2 (two) times daily as needed.    Dispense:  10 mL     Refill:  0  . NEOMYCIN-POLYMYXIN-HYDROCORTISONE (CORTISPORIN) 1 % SOLN OTIC solution    Sig: Place 3 drops into the left ear 4 (four) times daily.    Dispense:  10 mL    Refill:  0    Otic solution changed as pharmacy doesn't supply acetic acid-hydrocortisone. E-prescribed cortisporin.      If symptoms worsen or do not improve, return for follow-up, follow-up with PCP, or at the emergency department if severity of symptoms warrant a higher level of care.    Carroll Sage. Kenton Kingfisher, MSN, FNP-C  [facility]  [address] [place], [place] [id number] [phone]

## 2017-09-17 NOTE — Addendum Note (Signed)
Addended by: Scot Jun on: 09/17/2017 04:03 PM   Modules accepted: Orders

## 2017-09-17 NOTE — Patient Instructions (Signed)
Allergies An allergy is when your body reacts to a substance in a way that is not normal. An allergic reaction can happen after you:  Eat something.  Breathe in something.  Touch something.  You can be allergic to:  Things that are only around during certain seasons, like molds and pollens.  Foods.  Drugs.  Insects.  Animal dander.  What are the signs or symptoms?  Puffiness (swelling). This may happen on the lips, face, tongue, mouth, or throat.  Sneezing.  Coughing.  Breathing loudly (wheezing).  Stuffy nose.  Tingling in the mouth.  A rash.  Itching.  Itchy, red, puffy areas of skin (hives).  Watery eyes.  Throwing up (vomiting).  Watery poop (diarrhea).  Dizziness.  Feeling faint or fainting.  Trouble breathing or swallowing.  A tight feeling in the chest.  A fast heartbeat. How is this diagnosed? Allergies can be diagnosed with:  A medical and family history.  Skin tests.  Blood tests.  A food diary. A food diary is a record of all the foods, drinks, and symptoms you have each day.  The results of an elimination diet. This diet involves making sure not to eat certain foods and then seeing what happens when you start eating them again.  How is this treated? There is no cure for allergies, but allergic reactions can be treated with medicine. Severe reactions usually need to be treated at a hospital. How is this prevented? The best way to prevent an allergic reaction is to avoid the thing you are allergic to. Allergy shots and medicines can also help prevent reactions in some cases. This information is not intended to replace advice given to you by your health care provider. Make sure you discuss any questions you have with your health care provider. Document Released: 07/28/2012 Document Revised: 11/28/2015 Document Reviewed: 01/12/2014 Elsevier Interactive Patient Education  2018 Elsevier Inc.  

## 2017-09-25 ENCOUNTER — Other Ambulatory Visit: Payer: Self-pay | Admitting: Family Medicine

## 2017-09-25 DIAGNOSIS — I10 Essential (primary) hypertension: Secondary | ICD-10-CM

## 2017-09-25 MED ORDER — AMLODIPINE BESYLATE 5 MG PO TABS: 5.0000 mg | ORAL_TABLET | Freq: Every day | ORAL | 0 refills | Status: DC

## 2017-09-25 MED ORDER — LISINOPRIL 40 MG PO TABS: 40.0000 mg | ORAL_TABLET | Freq: Every day | ORAL | 0 refills | Status: DC

## 2017-09-25 NOTE — Telephone Encounter (Signed)
Pt called and needs refills on lisinopril and amlodipine - refilled medication x 1 month but pt needs apt as it has been over a year since lov.

## 2017-09-26 ENCOUNTER — Encounter: Payer: Self-pay | Admitting: Family Medicine

## 2017-09-26 DIAGNOSIS — I10 Essential (primary) hypertension: Secondary | ICD-10-CM

## 2017-09-27 MED ORDER — AMLODIPINE BESYLATE 5 MG PO TABS: 5.0000 mg | ORAL_TABLET | Freq: Every day | ORAL | 0 refills | Status: DC

## 2017-09-27 MED ORDER — LISINOPRIL 40 MG PO TABS: 40.0000 mg | ORAL_TABLET | Freq: Every day | ORAL | 0 refills | Status: DC

## 2017-10-28 ENCOUNTER — Encounter: Payer: Self-pay | Admitting: Family Medicine

## 2017-10-28 ENCOUNTER — Ambulatory Visit (INDEPENDENT_AMBULATORY_CARE_PROVIDER_SITE_OTHER): Payer: 59 | Admitting: Family Medicine

## 2017-10-28 VITALS — BP 128/82 | HR 83 | Temp 98.2°F | Resp 14 | Ht 70.0 in | Wt 169.0 lb

## 2017-10-28 DIAGNOSIS — Z124 Encounter for screening for malignant neoplasm of cervix: Secondary | ICD-10-CM | POA: Diagnosis not present

## 2017-10-28 DIAGNOSIS — Z1211 Encounter for screening for malignant neoplasm of colon: Secondary | ICD-10-CM

## 2017-10-28 DIAGNOSIS — I10 Essential (primary) hypertension: Secondary | ICD-10-CM

## 2017-10-28 DIAGNOSIS — Z1231 Encounter for screening mammogram for malignant neoplasm of breast: Secondary | ICD-10-CM

## 2017-10-28 DIAGNOSIS — Z0000 Encounter for general adult medical examination without abnormal findings: Secondary | ICD-10-CM | POA: Diagnosis not present

## 2017-10-28 DIAGNOSIS — E213 Hyperparathyroidism, unspecified: Secondary | ICD-10-CM | POA: Diagnosis not present

## 2017-10-28 DIAGNOSIS — Z1239 Encounter for other screening for malignant neoplasm of breast: Secondary | ICD-10-CM

## 2017-10-28 LAB — CBC WITH DIFFERENTIAL/PLATELET
Basophils Absolute: 42 cells/uL (ref 0–200)
Basophils Relative: 0.9 %
EOS PCT: 4.3 %
Eosinophils Absolute: 202 cells/uL (ref 15–500)
HEMATOCRIT: 39.5 % (ref 35.0–45.0)
HEMOGLOBIN: 13.4 g/dL (ref 11.7–15.5)
LYMPHS ABS: 1626 {cells}/uL (ref 850–3900)
MCH: 28.8 pg (ref 27.0–33.0)
MCHC: 33.9 g/dL (ref 32.0–36.0)
MCV: 84.9 fL (ref 80.0–100.0)
MPV: 10.8 fL (ref 7.5–12.5)
Monocytes Relative: 7.5 %
NEUTROS ABS: 2477 {cells}/uL (ref 1500–7800)
Neutrophils Relative %: 52.7 %
Platelets: 244 10*3/uL (ref 140–400)
RBC: 4.65 10*6/uL (ref 3.80–5.10)
RDW: 12.8 % (ref 11.0–15.0)
Total Lymphocyte: 34.6 %
WBC mixed population: 353 cells/uL (ref 200–950)
WBC: 4.7 10*3/uL (ref 3.8–10.8)

## 2017-10-28 LAB — COMPLETE METABOLIC PANEL WITH GFR
AG Ratio: 1.6 (calc) (ref 1.0–2.5)
ALBUMIN MSPROF: 4.6 g/dL (ref 3.6–5.1)
ALT: 16 U/L (ref 6–29)
AST: 23 U/L (ref 10–35)
Alkaline phosphatase (APISO): 126 U/L (ref 33–130)
BUN: 14 mg/dL (ref 7–25)
CO2: 27 mmol/L (ref 20–32)
CREATININE: 0.75 mg/dL (ref 0.50–1.05)
Calcium: 9.8 mg/dL (ref 8.6–10.4)
Chloride: 103 mmol/L (ref 98–110)
GFR, Est African American: 106 mL/min/{1.73_m2} (ref >=60)
GFR, Est Non African American: 92 mL/min/{1.73_m2} (ref >=60)
GLOBULIN: 2.8 g/dL (ref 1.9–3.7)
Glucose, Bld: 103 mg/dL — ABNORMAL HIGH (ref 65–99)
Potassium: 4 mmol/L (ref 3.5–5.3)
SODIUM: 139 mmol/L (ref 135–146)
Total Bilirubin: 0.4 mg/dL (ref 0.2–1.2)
Total Protein: 7.4 g/dL (ref 6.1–8.1)

## 2017-10-28 LAB — LIPID PANEL
CHOL/HDL RATIO: 2.6 (calc) (ref <5.0)
CHOLESTEROL: 206 mg/dL — AB (ref <200)
HDL: 79 mg/dL (ref >50)
LDL CHOLESTEROL (CALC): 112 mg/dL — AB
NON-HDL CHOLESTEROL (CALC): 127 mg/dL (ref <130)
Triglycerides: 64 mg/dL (ref <150)

## 2017-10-28 NOTE — Progress Notes (Signed)
Subjective:    Patient ID: Jennifer Castaneda, female    DOB: [birth year], 52 y.o.   MRN: [id number]  HPI  06/21/16 Was seen by my partner and amlodipine was added to HCTZ for HTN and was referred to urology for gross hematuria for several weeks.  Saw urology 2/19 and CT scan was ordered.  I do not have the results but according to the patient everything was normal. However she continues to have persistent occasional gross hematuria. The urologist as scheduled a cystoscopy in May. The patient is extremely concerned and questions if there is any way this can be expedited. Also her blood pressure is no better since the addition of amlodipine. She denies any chest pain shortness of breath or dyspnea on exertion. Unfortunately today the urinalysis is completely clear.  At that time, my plan was: Discontinue amlodipine and replace with lisinopril 40 mg a day and recheck blood pressure in one week. Given the gross hematuria, I will check a CMP to monitor renal function. Urinalysis today is normal. I will inquire to see if the urologist can possibly expedite the cystoscopy a little bit sooner than 2 months from now to try to was placed the patient's fears and she is concerned that she may have some type of bladder cancer although her risk is extremely low  08/06/16 On the patient's last lab work, her calcium level was 11.4. Repeat calcium level was 10.8. I had the patient discontinue hydrochlorothiazide and continue lisinopril. We added amlodipine back. Her blood pressure today is well controlled at 122/74. She is tolerating the combination of amlodipine and lisinopril without difficulty. She is here today to recheck her calcium level as well as to get a PTH.  At that time, my plan was: Patient was taking hydrochlorothiazide because of her history of kidney stones. I've had the patient discontinue hydrochlorothiazide because of hypercalcemia. Recheck serum calcium level today along with a PTH. If calcium  has returned to normal, we will continue the patient on amlodipine and lisinopril. If calcium remains elevated and PTH level is elevated, we will work the patient up for hyperparathyroidism.  10/28/17 Here for CPE.  Since I last saw the patient, she underwent parathyroidectomy.   Past Medical History:  Diagnosis Date  . Allergy   . History of nephrolithiasis   . Hyperparathyroidism ([place])    parathyroidectomy planned for 05/2017 at [facility]  . Hypertension   . Migraine headache    Past Surgical History:  Procedure Laterality Date  . BREAST SURGERY     cyst  . CESAREAN SECTION    . left superior parathyroidectomy     2019 at [facility] Dr. Carmin Muskrat  . NASAL SINUS SURGERY     Current Outpatient Medications on File Prior to Visit  Medication Sig Dispense Refill  . acetaminophen (TYLENOL) 500 MG tablet Take 500 mg by mouth.    Marland Kitchen amLODipine (NORVASC) 5 MG tablet Take 1 tablet (5 mg total) by mouth daily. 90 tablet 0  . ibuprofen (ADVIL,MOTRIN) 800 MG tablet Take 1 tablet (800 mg total) by mouth every 8 (eight) hours as needed. 30 tablet 0  . levocetirizine (XYZAL) 5 MG tablet Take 1 tablet (5 mg total) by mouth every evening. 90 tablet 1  . lisinopril (PRINIVIL,ZESTRIL) 40 MG tablet Take 1 tablet (40 mg total) by mouth daily. 90 tablet 0  . Multiple Vitamin (MULTIVITAMIN) tablet Take 1 tablet by mouth daily.     No current facility-administered medications on file prior to  visit.    Allergies  Allergen Reactions  . Aspartame And Phenylalanine Swelling  . Phenylalanine Anaphylaxis and Swelling  . Asa [Aspirin] Hives  . Penicillins Hives  . Skelaxin [Metaxalone] Nausea And Vomiting   Social History   Socioeconomic History  . Marital status: Married    Spouse name: Not on file  . Number of children: Not on file  . Years of education: Not on file  . Highest education level: Not on file  Occupational History  . Not on file  Social Needs  . Financial resource strain: Not on file  .  Food insecurity:    Worry: Not on file    Inability: Not on file  . Transportation needs:    Medical: Not on file    Non-medical: Not on file  Tobacco Use  . Smoking status: Former Smoker    Last attempt to quit: 08/14/2004    Years since quitting: 13.2  . Smokeless tobacco: Never Used  Substance and Sexual Activity  . Alcohol use: No  . Drug use: No  . Sexual activity: Not on file  Lifestyle  . Physical activity:    Days per week: Not on file    Minutes per session: Not on file  . Stress: Not on file  Relationships  . Social connections:    Talks on phone: Not on file    Gets together: Not on file    Attends religious service: Not on file    Active member of club or organization: Not on file    Attends meetings of clubs or organizations: Not on file    Relationship status: Not on file  . Intimate partner violence:    Fear of current or ex partner: Not on file    Emotionally abused: Not on file    Physically abused: Not on file    Forced sexual activity: Not on file  Other Topics Concern  . Not on file  Social History Narrative  . Not on file    Review of Systems  All other systems reviewed and are negative.      Objective:   Physical Exam  Constitutional: She is oriented to person, place, and time. She appears well-developed and well-nourished. No distress.  HENT:  Head: Normocephalic and atraumatic.  Right Ear: External ear normal.  Left Ear: External ear normal.  Nose: Nose normal.  Mouth/Throat: Oropharynx is clear and moist. No oropharyngeal exudate.  Eyes: Pupils are equal, round, and reactive to light. Conjunctivae and EOM are normal. Right eye exhibits no discharge. Left eye exhibits no discharge. No scleral icterus.  Neck: Normal range of motion. Neck supple. No JVD present. No tracheal deviation present. No thyromegaly present.  Cardiovascular: Normal rate, regular rhythm, normal heart sounds and intact distal pulses. Exam reveals no gallop and no  friction rub.  No murmur heard. Pulmonary/Chest: Effort normal and breath sounds normal. No stridor. No respiratory distress. She has no wheezes. She has no rales.  Abdominal: Soft. Bowel sounds are normal. She exhibits no distension and no mass. There is no tenderness. There is no rebound and no guarding. Hernia confirmed negative in the right inguinal area and confirmed negative in the left inguinal area.  Genitourinary: Vagina normal and uterus normal. There is no rash on the right labia. There is no rash on the left labia. Cervix exhibits no motion tenderness, no discharge and no friability. Right adnexum displays no mass, no tenderness and no fullness. Left adnexum displays no mass, no tenderness  and no fullness.  Musculoskeletal: She exhibits no edema.  Lymphadenopathy:    She has no cervical adenopathy.       Right: No inguinal adenopathy present.       Left: No inguinal adenopathy present.  Neurological: She is alert and oriented to person, place, and time. She has normal reflexes. She displays normal reflexes. No cranial nerve deficit. She exhibits normal muscle tone. Coordination normal.  Skin: Skin is warm. No rash noted. She is not diaphoretic. No erythema. No pallor.  Psychiatric: She has a normal mood and affect. Her behavior is normal. Judgment and thought content normal.  Vitals reviewed.         Assessment & Plan:  General medical exam - Plan: CBC with Differential/Platelet, COMPLETE METABOLIC PANEL WITH GFR, Lipid panel  Essential hypertension - Plan: CBC with Differential/Platelet, COMPLETE METABOLIC PANEL WITH GFR, Lipid panel  Hyperparathyroidism ([place])  Physical exam is normal.  I will schedule the patient for a mammogram.  Pap smear was sent to pathology in a labeled container.  Check CBC, CMP, fasting lipid panel.  Refer the patient to gastroenterology for colonoscopy.  Otherwise regular anticipatory guidance is provided

## 2017-10-29 ENCOUNTER — Encounter (INDEPENDENT_AMBULATORY_CARE_PROVIDER_SITE_OTHER): Payer: Self-pay

## 2017-10-29 LAB — PAP, TP IMAGING W/ HPV RNA, RFLX HPV TYPE 16,18/45: HPV DNA High Risk: NOT DETECTED

## 2017-11-11 ENCOUNTER — Encounter: Payer: Self-pay | Admitting: *Deleted

## 2017-11-11 ENCOUNTER — Other Ambulatory Visit: Payer: Self-pay

## 2017-11-11 DIAGNOSIS — Z1211 Encounter for screening for malignant neoplasm of colon: Secondary | ICD-10-CM

## 2017-11-11 MED ORDER — NA SULFATE-K SULFATE-MG SULF 17.5-3.13-1.6 GM/177ML PO SOLN: 1.0000 | Freq: Once | ORAL | 0 refills | Status: AC

## 2017-11-26 ENCOUNTER — Ambulatory Visit: Payer: 59 | Admitting: Anesthesiology

## 2017-11-26 ENCOUNTER — Ambulatory Visit
Admission: RE | Admit: 2017-11-26 | Discharge: 2017-11-26 | Disposition: A | Discharge status: Home / Self Care | Payer: 59 | Source: Ambulatory Visit | Attending: Gastroenterology | Admitting: Gastroenterology

## 2017-11-26 ENCOUNTER — Encounter
Admission: RE | Disposition: A | Discharge status: Home / Self Care | Payer: Self-pay | Source: Ambulatory Visit | Attending: Gastroenterology

## 2017-11-26 ENCOUNTER — Encounter: Payer: Self-pay | Admitting: Family Medicine

## 2017-11-26 ENCOUNTER — Other Ambulatory Visit: Payer: Self-pay

## 2017-11-26 ENCOUNTER — Encounter: Payer: Self-pay | Admitting: Anesthesiology

## 2017-11-26 DIAGNOSIS — Z1211 Encounter for screening for malignant neoplasm of colon: Secondary | ICD-10-CM

## 2017-11-26 DIAGNOSIS — Z800 Family history of malignant neoplasm of digestive organs: Secondary | ICD-10-CM | POA: Insufficient documentation

## 2017-11-26 DIAGNOSIS — I10 Essential (primary) hypertension: Secondary | ICD-10-CM | POA: Insufficient documentation

## 2017-11-26 DIAGNOSIS — Z79899 Other long term (current) drug therapy: Secondary | ICD-10-CM | POA: Insufficient documentation

## 2017-11-26 DIAGNOSIS — Z87891 Personal history of nicotine dependence: Secondary | ICD-10-CM | POA: Insufficient documentation

## 2017-11-26 DIAGNOSIS — K6389 Other specified diseases of intestine: Secondary | ICD-10-CM | POA: Diagnosis not present

## 2017-11-26 DIAGNOSIS — R197 Diarrhea, unspecified: Secondary | ICD-10-CM | POA: Diagnosis not present

## 2017-11-26 HISTORY — PX: COLONOSCOPY WITH PROPOFOL: SHX5780

## 2017-11-26 HISTORY — DX: Other specified postprocedural states: Z98.890

## 2017-11-26 HISTORY — DX: Other specified postprocedural states: R11.2

## 2017-11-26 HISTORY — DX: Adverse effect of unspecified anesthetic, initial encounter: T41.45XA

## 2017-11-26 HISTORY — DX: Other complications of anesthesia, initial encounter: T88.59XA

## 2017-11-26 SURGERY — COLONOSCOPY WITH PROPOFOL
Anesthesia: General

## 2017-11-26 MED ORDER — PROPOFOL 10 MG/ML IV BOLUS
INTRAVENOUS | Status: DC | PRN
  Administered 2017-11-26: 70 mg via INTRAVENOUS

## 2017-11-26 MED ORDER — PROPOFOL 500 MG/50ML IV EMUL
INTRAVENOUS | Status: DC | PRN
  Administered 2017-11-26: 140 ug/kg/min via INTRAVENOUS

## 2017-11-26 MED ORDER — SODIUM CHLORIDE 0.9 % IV SOLN
INTRAVENOUS | Status: DC
  Administered 2017-11-26: 14:00:00 via INTRAVENOUS

## 2017-11-26 NOTE — H&P (Signed)
Jennifer Darby, MD [address]  [place]  [place], [place] [address]  Main: [phone]  Fax: [phone] Pager: [phone]  Primary Care Physician:  Jennifer Frizzle, MD Primary Gastroenterologist:  Dr. Cephas Castaneda  Pre-Procedure History & Physical: HPI:  Jennifer Castaneda is a 52 y.o. female is here for an colonoscopy.   Past Medical History:  Diagnosis Date  . Allergy   . Complication of anesthesia   . History of nephrolithiasis   . Hyperparathyroidism ([place])    parathy[facility] at [facility]  . Hypertension   . Migraine headache   . PONV (postoperative nausea and vomiting)     Past Surgical History:  Procedure Laterality Date  . BREAST SURGERY     cyst  . CESAREAN SECTION    . left superior parathyroidectomy     2019 at [facility] Dr. Carmin Muskrat  . NASAL SINUS SURGERY    . PARATHYROIDECTOMY      Prior to Admission medications   Medication Sig Start Date End Date Taking? Authorizing Provider  acetaminophen (TYLENOL) 500 MG tablet Take 500 mg by mouth.   Yes [provider]  amLODipine (NORVASC) 5 MG tablet Take 1 tablet (5 mg total) by mouth daily. 09/27/17  Yes Jennifer Frizzle, MD  ibuprofen (ADVIL,MOTRIN) 800 MG tablet Take 1 tablet (800 mg total) by mouth every 8 (eight) hours as needed. 01/27/16  Yes Jennifer Guess, PA-C  levocetirizine (XYZAL) 5 MG tablet Take 1 tablet (5 mg total) by mouth every evening. 09/17/17  Yes Jennifer Jun, FNP  lisinopril (PRINIVIL,ZESTRIL) 40 MG tablet Take 1 tablet (40 mg total) by mouth daily. 09/27/17  Yes Jennifer Frizzle, MD  Multiple Vitamin (MULTIVITAMIN) tablet Take 1 tablet by mouth daily.   Yes [provider]    Allergies as of 11/11/2017 - Review Complete 10/28/2017  Allergen Reaction Noted  . Aspartame and phenylalanine Swelling 01/27/2016  . Phenylalanine Anaphylaxis and Swelling 01/27/2016  . Asa [aspirin] Hives 08/13/2012  . Penicillins Hives 08/13/2012  .  Skelaxin [metaxalone] Nausea And Vomiting 09/03/2012    Family History  Problem Relation Age of Onset  . Heart disease Mother   . Hypertension Mother   . Cancer Neg Hx     Social History   Socioeconomic History  . Marital status: Married    Spouse name: Not on file  . Number of children: Not on file  . Years of education: Not on file  . Highest education level: Not on file  Occupational History  . Not on file  Social Needs  . Financial resource strain: Not on file  . Food insecurity:    Worry: Not on file    Inability: Not on file  . Transportation needs:    Medical: Not on file    Non-medical: Not on file  Tobacco Use  . Smoking status: Former Smoker    Last attempt to quit: 08/14/2004    Years since quitting: 13.2  . Smokeless tobacco: Never Used  Substance and Sexual Activity  . Alcohol use: No  . Drug use: No  . Sexual activity: Not on file  Lifestyle  . Physical activity:    Days per week: Not on file    Minutes per session: Not on file  . Stress: Not on file  Relationships  . Social connections:    Talks on phone: Not on file    Gets together: Not on file    Attends religious service:  Not on file    Active member of club or organization: Not on file    Attends meetings of clubs or organizations: Not on file    Relationship status: Not on file  . Intimate partner violence:    Fear of current or ex partner: Not on file    Emotionally abused: Not on file    Physically abused: Not on file    Forced sexual activity: Not on file  Other Topics Concern  . Not on file  Social History Narrative  . Not on file    Review of Systems: See HPI, otherwise negative ROS  Physical Exam: BP 136/79   Pulse 62   Temp 98.7 F (37.1 C)   Resp 18   Ht 5\' 10"  (1.778 m)   Wt 76.2 kg   LMP 04/16/2012   SpO2 100%   BMI 24.11 kg/m  General:   Alert,  pleasant and cooperative in NAD Head:  Normocephalic and atraumatic. Neck:  Supple; no masses or  thyromegaly. Lungs:  Clear throughout to auscultation.    Heart:  Regular rate and rhythm. Abdomen:  Soft, nontender and nondistended. Normal bowel sounds, without guarding, and without rebound.   Neurologic:  Alert and  oriented x4;  grossly normal neurologically.  Impression/Plan: Jennifer Castaneda is here for an colonoscopy to be performed for colon cancer screening  Risks, benefits, limitations, and alternatives regarding  colonoscopy have been reviewed with the patient.  Questions have been answered.  All parties agreeable.   Sherri Sear, MD  11/26/2017, 1:47 PM

## 2017-11-26 NOTE — Op Note (Signed)
[facility] Gastroenterology Patient Name: Jennifer Castaneda Procedure Date: 11/26/2017 1:53 PM MRN: [id number] Account #: [id number] Date of Birth: [birth year] Admit Type: Outpatient Age: 52 Room: [facility] ENDO ROOM 1 Gender: Female Note Status: Finalized Procedure:            Colonoscopy Indications:          Screening for colorectal malignant neoplasm, Family                        history of colon cancer in multiple second-degree                        relatives Providers:            Lin Landsman MD, MD Referring MD:         Cammie Mcgee. Pickard (Referring MD) Medicines:            Monitored Anesthesia Care Complications:        No immediate complications. Estimated blood loss: None. Procedure:            Pre-Anesthesia Assessment:                       - Prior to the procedure, a History and Physical was                        performed, and patient medications and allergies were                        reviewed. The patient is competent. The risks and                        benefits of the procedure and the sedation options and                        risks were discussed with the patient. All questions                        were answered and informed consent was obtained.                        Patient identification and proposed procedure were                        verified by the physician, the nurse, the                        anesthesiologist, the anesthetist and the technician in                        the pre-procedure area in the procedure room in the                        endoscopy suite. Mental Status Examination: alert and                        oriented. Airway Examination: normal oropharyngeal                        airway and neck mobility. Respiratory Examination:  clear to auscultation. CV Examination: normal.                        Prophylactic Antibiotics: The patient does not require                         prophylactic antibiotics. Prior Anticoagulants: The                        patient has taken no previous anticoagulant or                        antiplatelet agents. ASA Grade Assessment: II - A                        patient with mild systemic disease. After reviewing the                        risks and benefits, the patient was deemed in                        satisfactory condition to undergo the procedure. The                        anesthesia plan was to use monitored anesthesia care                        (MAC). Immediately prior to administration of                        medications, the patient was re-assessed for adequacy                        to receive sedatives. The heart rate, respiratory rate,                        oxygen saturations, blood pressure, adequacy of                        pulmonary ventilation, and response to care were                        monitored throughout the procedure. The physical status                        of the patient was re-assessed after the procedure.                       After obtaining informed consent, the colonoscope was                        passed under direct vision. Throughout the procedure,                        the patient's blood pressure, pulse, and oxygen                        saturations were monitored continuously. The                        Colonoscope  was introduced through the anus and                        advanced to the the terminal ileum. The colonoscopy was                        performed without difficulty. The patient tolerated the                        procedure well. The quality of the bowel preparation                        was evaluated using the BBPS [facility] Bowel Preparation                        Scale) with scores of: Right Colon = 3, Transverse                        Colon = 3 and Left Colon = 3 (entire mucosa seen well                        with no residual staining, small fragments of stool or                         opaque liquid). The total BBPS score equals 9. Findings:      The perianal and digital rectal examinations were normal. Pertinent       negatives include normal sphincter tone and no palpable rectal lesions.      The terminal ileum appeared normal.      The colon (entire examined portion) appeared normal. Biopsies were taken       with a cold forceps for histology due to pt's h/o diarrhea.      The retroflexed view of the distal rectum and anal verge was normal and       showed no anal or rectal abnormalities. Impression:           - The examined portion of the ileum was normal.                       - The entire examined colon is normal. Biopsied.                       - The distal rectum and anal verge are normal on                        retroflexion view. Recommendation:       - Discharge patient to home (with escort).                       - Resume regular diet.                       - Continue present medications.                       - Await pathology results.                       - Repeat colonoscopy in 5 years for surveillance.                       -  Return to my office in 4 weeks. Procedure Code(s):    --- Professional ---                       [id number], Colonoscopy, flexible; with biopsy, single or                        multiple Diagnosis Code(s):    --- Professional ---                       Z12.11, Encounter for screening for malignant neoplasm                        of colon                       Z80.0, Family history of malignant neoplasm of                        digestive organs CPT copyright 2017 American Medical Association. All rights reserved. The codes documented in this report are preliminary and upon coder review may  be revised to meet current compliance requirements. Dr. Ulyess Mort Lin Landsman MD, MD 11/26/2017 2:15:58 PM This report has been signed electronically. Number of Addenda: 0 Note Initiated On: 11/26/2017 1:53  PM Scope Withdrawal Time: 0 hours 11 minutes 9 seconds  Total Procedure Duration: 0 hours 15 minutes 35 seconds       [facility]

## 2017-11-26 NOTE — Anesthesia Postprocedure Evaluation (Signed)
Anesthesia Post Note  Patient: Jennifer Castaneda  Procedure(s) Performed: COLONOSCOPY WITH PROPOFOL (N/A )  Patient location during evaluation: Endoscopy Anesthesia Type: General Level of consciousness: awake and alert Pain management: pain level controlled Vital Signs Assessment: post-procedure vital signs reviewed and stable Respiratory status: spontaneous breathing, nonlabored ventilation, respiratory function stable and patient connected to nasal cannula oxygen Cardiovascular status: blood pressure returned to baseline and stable Postop Assessment: no apparent nausea or vomiting Anesthetic complications: no     Last Vitals:  Vitals:   11/26/17 1428 11/26/17 1438  BP: (!) 115/102 123/65  Pulse: 75 73  Resp: 18 17  Temp:    SpO2: 100% 100%    Last Pain:  Vitals:   11/26/17 1438  PainSc: 0-No pain                 Daurice Ovando S

## 2017-11-26 NOTE — Anesthesia Post-op Follow-up Note (Signed)
Anesthesia QCDR form completed.        

## 2017-11-26 NOTE — Anesthesia Preprocedure Evaluation (Signed)
Anesthesia Evaluation  Patient identified by MRN, date of birth, ID band Patient awake    Reviewed: Allergy & Precautions, H&P , NPO status , Patient's Chart, lab work & pertinent test results, reviewed documented beta blocker date and time   History of Anesthesia Complications (+) PONV and history of anesthetic complications  Airway Mallampati: I  TM Distance: >3 FB Neck ROM: full    Dental  (+) Dental Advidsory Given   Pulmonary neg pulmonary ROS, former smoker,           Cardiovascular Exercise Tolerance: Good hypertension, (-) angina(-) CAD, (-) Past MI, (-) Cardiac Stents and (-) CABG (-) dysrhythmias (-) Valvular Problems/Murmurs     Neuro/Psych negative neurological ROS  negative psych ROS   GI/Hepatic negative GI ROS, Neg liver ROS,   Endo/Other  negative endocrine ROS  Renal/GU negative Renal ROS  negative genitourinary   Musculoskeletal   Abdominal   Peds  Hematology negative hematology ROS (+)   Anesthesia Other Findings Past Medical History: No date: Allergy No date: Complication of anesthesia No date: History of nephrolithiasis No date: Hyperparathyroidism [facility])     Comment:  par[facility] No date: Hypertension No date: Migraine headache No date: PONV (postoperative nausea and vomiting)   Reproductive/Obstetrics negative OB ROS                             Anesthesia Physical Anesthesia Plan  ASA: II  Anesthesia Plan: General   Post-op Pain Management:    Induction: Intravenous  PONV Risk Score and Plan: 4 or greater and Propofol infusion and TIVA  Airway Management Planned: Nasal Cannula  Additional Equipment:   Intra-op Plan:   Post-operative Plan:   Informed Consent: I have reviewed the patients History and Physical, chart, labs and discussed the procedure including the risks, benefits and alternatives for the proposed  anesthesia with the patient or authorized representative who has indicated his/her understanding and acceptance.   Dental Advisory Given  Plan Discussed with: Anesthesiologist, CRNA and Surgeon  Anesthesia Plan Comments:         Anesthesia Quick Evaluation

## 2017-11-26 NOTE — Transfer of Care (Signed)
Immediate Anesthesia Transfer of Care Note  Patient: Rafeef L Jeffries-Lopez  Procedure(s) Performed: COLONOSCOPY WITH PROPOFOL (N/A )  Patient Location: PACU  Anesthesia Type:General  Level of Consciousness: sedated  Airway & Oxygen Therapy: Patient Spontanous Breathing and Patient connected to nasal cannula oxygen  Post-op Assessment: Report given to RN and Post -op Vital signs reviewed and stable  Post vital signs: Reviewed and stable  Last Vitals:  Vitals Value Taken Time  BP 92/65 11/26/2017  2:18 PM  Temp 36.2 C 11/26/2017  2:18 PM  Pulse 70 11/26/2017  2:18 PM  Resp 17 11/26/2017  2:18 PM  SpO2 100 % 11/26/2017  2:18 PM  Vitals shown include unvalidated device data.  Last Pain:  Vitals:   11/26/17 1316  PainSc: 0-No pain         Complications: No apparent anesthesia complications

## 2017-11-29 LAB — SURGICAL PATHOLOGY

## 2017-12-02 ENCOUNTER — Encounter: Payer: Self-pay | Admitting: Gastroenterology

## 2017-12-25 ENCOUNTER — Other Ambulatory Visit: Payer: 59

## 2017-12-25 DIAGNOSIS — Z8719 Personal history of other diseases of the digestive system: Secondary | ICD-10-CM

## 2017-12-26 LAB — CELIAC DISEASE PANEL
(tTG) Ab, IgA: 1 U/mL
(tTG) Ab, IgG: 2 U/mL
Gliadin IgA: 5 Units
Gliadin IgG: 29 Units — ABNORMAL HIGH
Immunoglobulin A: 187 mg/dL (ref 47–310)

## 2017-12-30 ENCOUNTER — Ambulatory Visit: Payer: 59 | Admitting: Gastroenterology

## 2017-12-30 ENCOUNTER — Other Ambulatory Visit: Payer: Self-pay

## 2017-12-30 ENCOUNTER — Encounter: Payer: Self-pay | Admitting: Gastroenterology

## 2017-12-30 VITALS — BP 111/69 | HR 71 | Ht 70.0 in | Wt 168.6 lb

## 2017-12-30 DIAGNOSIS — R1013 Epigastric pain: Secondary | ICD-10-CM | POA: Diagnosis not present

## 2017-12-30 DIAGNOSIS — K580 Irritable bowel syndrome with diarrhea: Secondary | ICD-10-CM | POA: Diagnosis not present

## 2017-12-30 DIAGNOSIS — N200 Calculus of kidney: Secondary | ICD-10-CM | POA: Insufficient documentation

## 2017-12-30 MED ORDER — AMITRIPTYLINE HCL 25 MG PO TABS: 25.0000 mg | ORAL_TABLET | Freq: Every day | ORAL | 0 refills | Status: DC

## 2017-12-30 NOTE — Progress Notes (Signed)
[name], MD [address]  [place]  [place], [place] [address]  Main: [phone]  Fax: [phone]    Gastroenterology Consultation  Referring Provider:     Susy Frizzle, MD Primary Care Physician:  Susy Frizzle, MD Primary Gastroenterologist:  Dr. [name] Reason for Consultation:     Chronic diarrhea        HPI:   Jennifer Castaneda is a 52 y.o. female referred by Dr. Susy Frizzle, MD  for consultation & management of and chronic diarrhea. She reports having chronic, watery bowel movements since childhood that has significantly limited her to eat out. She did have episodes of urgency with incontinence in the past. A few years ago, she was tested for celiac disease and it was negative. She used to experience pruritic rash in her bilateral elbows and behind both knees. She started eliminating gluten from her diet and her symptoms significantly improved including rash. She continues to avoid gluten from majority of her diet.She was told that she may have non-celiac gluten sensitivity. Her repeat celiac serologies including TTG IgA came back negative. Total IgA levels were normal. She had parathyroidectomy in 05/2017. Currently, her diarrhea is 75% better, reports having 2 soft bowel movements daily. She does have intermittent episodes of epigastric discomfort associated with bloating. Her weight has been stable. She denies any other GI symptoms  She does report that her job can be sometimes stressful Her parents are Native [place] and [place] origin NSAIDs: ibuprofen 800 mg every 8 hours as needed  Antiplts/Anticoagulants/Anti thrombotics: none She has history of colon cancer in multiple second-degree relatives GI Procedures:  Colonoscopy 11/26/2017 for colon cancer screening - The examined portion of the ileum was normal. - The entire examined colon is normal. Biopsied. - The distal rectum and anal verge are normal on retroflexion  view.  DIAGNOSIS:  A. COLON, RANDOM; COLD BIOPSY:  - COLONIC MUCOSA WITH PROMINENT LYMPHOID AGGREGATES AND MELANOSIS COLI.  - NEGATIVE FOR MICROSCOPIC COLITIS, DYSPLASIA, AND MALIGNANCY.   Past Medical History:  Diagnosis Date  . Allergy   . Complication of anesthesia   . History of nephrolithiasis   . Hyperparathyroidism ([place])    parathyroidectomy planned for 05/2017 at [facility]  . Hypertension   . Migraine headache   . PONV (postoperative nausea and vomiting)     Past Surgical History:  Procedure Laterality Date  . BREAST SURGERY     cyst  . CESAREAN SECTION    . COLONOSCOPY WITH PROPOFOL N/A 11/26/2017   Procedure: COLONOSCOPY WITH PROPOFOL;  Surgeon: Lin Landsman, MD;  Location: [facility] ENDOSCOPY;  Service: Gastroenterology;  Laterality: N/A;  . left superior parathyroidectomy     2019 at [facility] Dr. Carmin Muskrat  . NASAL SINUS SURGERY    . PARATHYROIDECTOMY       Current Outpatient Medications:  .  acetaminophen (TYLENOL) 500 MG tablet, Take 500 mg by mouth., Disp: , Rfl:  .  amLODipine (NORVASC) 5 MG tablet, Take 1 tablet (5 mg total) by mouth daily., Disp: 90 tablet, Rfl: 0 .  butalbital-acetaminophen-caffeine (FIORICET, ESGIC) 50-325-40 MG tablet, TAKE 1 TABLET BY MOUTH TWICE DAILY AS NEEDED FOR HEADACHE, Disp: , Rfl:  .  ibuprofen (ADVIL,MOTRIN) 800 MG tablet, Take 1 tablet (800 mg total) by mouth every 8 (eight) hours as needed., Disp: 30 tablet, Rfl: 0 .  lisinopril (PRINIVIL,ZESTRIL) 40 MG tablet, Take 1 tablet (40 mg total) by mouth daily., Disp: 90 tablet, Rfl: 0 .  Multiple Vitamin (MULTIVITAMIN) tablet, Take 1 tablet by mouth daily., Disp: , Rfl:  .  amitriptyline (ELAVIL) 25 MG tablet, Take 1 tablet (25 mg total) by mouth at bedtime., Disp: 30 tablet, Rfl: 0   Family History  Problem Relation Age of Onset  . Heart disease Mother   . Hypertension Mother   . Cancer Neg Hx      Social History   Tobacco Use  . Smoking status: Former Smoker    Last attempt  to quit: 08/14/2004    Years since quitting: 13.3  . Smokeless tobacco: Never Used  Substance Use Topics  . Alcohol use: No  . Drug use: No    Allergies as of 12/30/2017 - Review Complete 12/30/2017  Allergen Reaction Noted  . Aspartame and phenylalanine Swelling 01/27/2016  . Phenylalanine Anaphylaxis and Swelling 01/27/2016  . Asa [aspirin] Hives 08/13/2012  . Penicillins Hives 08/13/2012  . Skelaxin [metaxalone] Nausea And Vomiting 09/03/2012    Review of Systems:    All systems reviewed and negative except where noted in HPI.   Physical Exam:  BP 111/69   Pulse 71   Ht '5\' 10"'  (1.778 m)   Wt 168 lb 9.6 oz (76.5 kg)   LMP 04/16/2012   BMI 24.19 kg/m  Patient's last menstrual period was 04/16/2012.  General:   Alert,  Well-developed, well-nourished, pleasant and cooperative in NAD Head:  Normocephalic and atraumatic. Eyes:  Sclera clear, no icterus.   Conjunctiva pink. Ears:  Normal auditory acuity. Nose:  No deformity, discharge, or lesions. Mouth:  No deformity or lesions,oropharynx pink & moist. Neck:  Supple; no masses or thyromegaly. Lungs:  Respirations even and unlabored.  Clear throughout to auscultation.   No wheezes, crackles, or rhonchi. No acute distress. Heart:  Regular rate and rhythm; no murmurs, clicks, rubs, or gallops. Abdomen:  Normal bowel sounds. Soft, non-tender and non-distended without masses, hepatosplenomegaly or hernias noted.  No guarding or rebound tenderness.   Rectal: Not performed Msk:  Symmetrical without gross deformities. Good, equal movement & strength bilaterally. Pulses:  Normal pulses noted. Extremities:  No clubbing or edema.  No cyanosis. Neurologic:  Alert and oriented x3;  grossly normal neurologically. Skin:  Intact without significant lesions or rashes. No jaundice. Lymph Nodes:  No significant cervical adenopathy. Psych:  Alert and cooperative. Normal mood and affect.  Imaging Studies: No abdominal imaging in last 5  years  Assessment and Plan:   Jennifer Castaneda is a 52 y.o. female with history of parathyroidectomy, chronic nonbloody diarrhea without any constitutional symptoms and symptoms of dyspepsia. She probably has diarrhea predominant irritable bowel syndrome and may have non-celiac gluten sensitivity. Her celiac blood tests are negative. Discussed with her about the possibilities of continuing gluten-free diet or undergo a gluten challenge, then perform EGD with duodenal biopsies. I also discussed with her about the role of HLA DQ 2/DQ8 testing to rule out celiac disease. She preferred to continue gluten-free diet without undergoing further investigation  Dyspepsia: perform H. Pylori breath test and treat if positive   Follow up as needed  [name], MD

## 2018-01-01 LAB — H. PYLORI BREATH TEST: H PYLORI BREATH TEST: NEGATIVE

## 2018-02-05 ENCOUNTER — Ambulatory Visit
Admission: RE | Admit: 2018-02-05 | Discharge: 2018-02-05 | Disposition: A | Discharge status: Home / Self Care | Payer: 59 | Source: Ambulatory Visit | Attending: Family Medicine | Admitting: Family Medicine

## 2018-02-05 DIAGNOSIS — Z1231 Encounter for screening mammogram for malignant neoplasm of breast: Secondary | ICD-10-CM | POA: Diagnosis not present

## 2018-02-05 DIAGNOSIS — Z1239 Encounter for other screening for malignant neoplasm of breast: Secondary | ICD-10-CM

## 2018-03-03 ENCOUNTER — Other Ambulatory Visit: Payer: Self-pay | Admitting: Family Medicine

## 2018-04-13 ENCOUNTER — Inpatient Hospital Stay (HOSPITAL_COMMUNITY)
Admission: AD | Admit: 2018-04-13 | Discharge: 2018-04-13 | Disposition: A | Discharge status: Home / Self Care | Payer: 59 | Source: Ambulatory Visit | Attending: Obstetrics & Gynecology | Admitting: Obstetrics & Gynecology

## 2018-04-13 ENCOUNTER — Encounter (HOSPITAL_COMMUNITY): Payer: Self-pay | Admitting: *Deleted

## 2018-04-13 DIAGNOSIS — K529 Noninfective gastroenteritis and colitis, unspecified: Secondary | ICD-10-CM | POA: Diagnosis not present

## 2018-04-13 DIAGNOSIS — Z79899 Other long term (current) drug therapy: Secondary | ICD-10-CM | POA: Diagnosis not present

## 2018-04-13 DIAGNOSIS — Z87891 Personal history of nicotine dependence: Secondary | ICD-10-CM | POA: Diagnosis not present

## 2018-04-13 DIAGNOSIS — I10 Essential (primary) hypertension: Secondary | ICD-10-CM | POA: Diagnosis not present

## 2018-04-13 DIAGNOSIS — R42 Dizziness and giddiness: Secondary | ICD-10-CM | POA: Diagnosis not present

## 2018-04-13 DIAGNOSIS — Z87442 Personal history of urinary calculi: Secondary | ICD-10-CM | POA: Diagnosis not present

## 2018-04-13 DIAGNOSIS — Z8249 Family history of ischemic heart disease and other diseases of the circulatory system: Secondary | ICD-10-CM | POA: Insufficient documentation

## 2018-04-13 LAB — COMPREHENSIVE METABOLIC PANEL
ALT: 20 U/L (ref 0–44)
ANION GAP: 8 (ref 5–15)
AST: 31 U/L (ref 15–41)
Albumin: 4.4 g/dL (ref 3.5–5.0)
Alkaline Phosphatase: 108 U/L (ref 38–126)
BILIRUBIN TOTAL: 0.8 mg/dL (ref 0.3–1.2)
BUN: 20 mg/dL (ref 6–20)
CO2: 25 mmol/L (ref 22–32)
Calcium: 9.4 mg/dL (ref 8.9–10.3)
Chloride: 106 mmol/L (ref 98–111)
Creatinine, Ser: 0.89 mg/dL (ref 0.44–1.00)
GFR calc Af Amer: >60 mL/min (ref >60)
Glucose, Bld: 116 mg/dL — ABNORMAL HIGH (ref 70–99)
POTASSIUM: 3.7 mmol/L (ref 3.5–5.1)
Sodium: 139 mmol/L (ref 135–145)
TOTAL PROTEIN: 7.9 g/dL (ref 6.5–8.1)

## 2018-04-13 LAB — CBC
HEMATOCRIT: 40.9 % (ref 36.0–46.0)
Hemoglobin: 13.2 g/dL (ref 12.0–15.0)
MCH: 29.2 pg (ref 26.0–34.0)
MCHC: 32.3 g/dL (ref 30.0–36.0)
MCV: 90.5 fL (ref 80.0–100.0)
Platelets: 232 10*3/uL (ref 150–400)
RBC: 4.52 MIL/uL (ref 3.87–5.11)
RDW: 12.9 % (ref 11.5–15.5)
WBC: 5.3 10*3/uL (ref 4.0–10.5)
nRBC: 0 % (ref 0.0–0.2)

## 2018-04-13 LAB — URINALYSIS, ROUTINE W REFLEX MICROSCOPIC
Bilirubin Urine: NEGATIVE
Glucose, UA: NEGATIVE mg/dL
HGB URINE DIPSTICK: NEGATIVE
Ketones, ur: NEGATIVE mg/dL
Leukocytes, UA: NEGATIVE
Nitrite: NEGATIVE
PH: 9 — AB (ref 5.0–8.0)
Protein, ur: NEGATIVE mg/dL
SPECIFIC GRAVITY, URINE: 1.012 (ref 1.005–1.030)

## 2018-04-13 MED ORDER — LACTATED RINGERS IV BOLUS
1000.0000 mL | Freq: Once | INTRAVENOUS | Status: AC
  Administered 2018-04-13: 1000 mL via INTRAVENOUS

## 2018-04-13 MED ORDER — ONDANSETRON HCL 4 MG PO TABS: 4.0000 mg | ORAL_TABLET | Freq: Three times a day (TID) | ORAL | 0 refills | Status: DC | PRN

## 2018-04-13 MED ORDER — PROMETHAZINE HCL 25 MG/ML IJ SOLN
25.0000 mg | Freq: Once | INTRAMUSCULAR | Status: AC
  Administered 2018-04-13: 25 mg via INTRAVENOUS
  Filled 2018-04-13: qty 1

## 2018-04-13 MED ORDER — ONDANSETRON HCL 4 MG/2ML IJ SOLN
4.0000 mg | Freq: Once | INTRAMUSCULAR | Status: AC
  Administered 2018-04-13: 4 mg via INTRAVENOUS
  Filled 2018-04-13: qty 2

## 2018-04-13 MED ORDER — DEXTROSE IN LACTATED RINGERS 5 % IV SOLN
Freq: Once | INTRAVENOUS | Status: AC
  Administered 2018-04-13: 08:00:00 via INTRAVENOUS

## 2018-04-13 MED ORDER — MECLIZINE HCL 25 MG PO TABS
50.0000 mg | ORAL_TABLET | Freq: Once | ORAL | Status: AC
  Administered 2018-04-13: 50 mg via ORAL
  Filled 2018-04-13: qty 2

## 2018-04-13 MED ORDER — MECLIZINE HCL 25 MG PO TABS: 25.0000 mg | ORAL_TABLET | Freq: Three times a day (TID) | ORAL | 0 refills | Status: DC | PRN

## 2018-04-13 NOTE — MAU Note (Signed)
Dizziness with n/v for hour or so. Feels like if vomit will have diarrhea. Unsure if ate something that is causing symtoms

## 2018-04-13 NOTE — Discharge Instructions (Signed)
In late 2019, the [facility] will be moving to the [place]. At that time, the MAU (Maternity Admissions Unit), where you are being seen today, will no longer take care of non-pregnant patients. We strongly encourage you to find a doctor's office before that time, so that you can be seen with any GYN concerns, like vaginal discharge, urinary tract infection, etc.. in a timely manner.  In order to make an office visit more convenient, the Center for [place] at [facility] will be offering evening hours with same-day appointments, walk-in appointments and scheduled appointments available during this time.  Center for [facility] @ [facility] Hours: Monday - 8am - 7:30 pm with walk-in between 4pm- 7:30 pm Tuesday - 8 am - 5 pm (starting 07/16/17 we will be open late and accepting walk-ins from 4pm - 7:30pm) Wednesday - 8 am - 5 pm (starting 10/16/17 we will be open late and accepting walk-ins from 4pm - 7:30pm) Thursday 8 am - 5 pm (starting 01/16/18 we will be open late and accepting walk-ins from 4pm - 7:30pm) Friday 8 am - 5 pm  For an appointment please call the Center for [place] @ [facility] at [phone]  For urgent needs, [facility] is also available for management of urgent GYN complaints such as vaginal discharge or urinary tract infections.        Viral Gastroenteritis, Adult  Viral gastroenteritis is also known as the stomach flu. This condition is caused by certain germs (viruses). These germs can be passed from person to person very easily (are very contagious). This condition can cause sudden watery poop (diarrhea), fever, and throwing up (vomiting). Having watery poop and throwing up can make you feel weak and cause you to get dehydrated. Dehydration can make you tired and thirsty, make you have a dry mouth, and make it so you pee (urinate) less often. Older adults and people with other diseases or a weak  defense system (immune system) are at higher risk for dehydration. It is important to replace the fluids that you lose from having watery poop and throwing up. Follow these instructions at home: Follow instructions from your doctor about how to care for yourself at home. Eating and drinking Follow these instructions as told by your doctor:  Take an oral rehydration solution (ORS). This is a drink that is sold at pharmacies and stores.  Drink clear fluids in small amounts as you are able, such as: ? Water. ? Ice chips. ? Diluted fruit juice. ? Low-calorie sports drinks.  Eat bland, easy-to-digest foods in small amounts as you are able, such as: ? Bananas. ? Applesauce. ? Rice. ? Low-fat (lean) meats. ? Toast. ? Crackers.  Avoid fluids that have a lot of sugar or caffeine in them.  Avoid alcohol.  Avoid spicy or fatty foods. General instructions   Drink enough fluid to keep your pee (urine) clear or pale yellow.  Wash your hands often. If you cannot use soap and water, use hand sanitizer.  Make sure that all people in your home wash their hands well and often.  Rest at home while you get better.  Take over-the-counter and prescription medicines only as told by your doctor.  Watch your condition for any changes.  Take a warm bath to help with any burning or pain from having watery poop.  Keep all follow-up visits as told by your doctor. This is important. Contact a doctor if:  You cannot keep fluids  down.  Your symptoms get worse.  You have new symptoms.  You feel light-headed or dizzy.  You have muscle cramps. Get help right away if:  You have chest pain.  You feel very weak or you pass out (faint).  You see blood in your throw-up.  Your throw-up looks like coffee grounds.  You have bloody or black poop (stools) or poop that look like tar.  You have a very bad headache, a stiff neck, or both.  You have a rash.  You have very bad pain, cramping, or  bloating in your belly (abdomen).  You have trouble breathing.  You are breathing very quickly.  Your heart is beating very quickly.  Your skin feels cold and clammy.  You feel confused.  You have pain when you pee.  You have signs of dehydration, such as: ? Dark pee, hardly any pee, or no pee. ? Cracked lips. ? Dry mouth. ? Sunken eyes. ? Sleepiness. ? Weakness. This information is not intended to replace advice given to you by your health care provider. Make sure you discuss any questions you have with your health care provider. Document Released: 09/19/2007 Document Revised: 12/25/2017 Document Reviewed: 12/07/2014 Elsevier Interactive Patient Education  2019 [employer].  Vertigo  Vertigo means that you feel like you are moving when you are not. Vertigo can also make you feel like things around you are moving when they are not. This feeling can come and go at any time. Vertigo often goes away on its own. Follow these instructions at home:  Avoid making fast movements.  Avoid driving.  Avoid using heavy machinery.  Avoid doing any task or activity that might cause danger to you or other people if you would have a vertigo attack while you are doing it.  Sit down right away if you feel dizzy or have trouble with your balance.  Take over-the-counter and prescription medicines only as told by your doctor.  Follow instructions from your doctor about which positions or movements you should avoid.  Drink enough fluid to keep your pee (urine) clear or pale yellow.  Keep all follow-up visits as told by your doctor. This is important. Contact a doctor if:  Medicine does not help your vertigo.  You have a fever.  Your problems get worse or you have new symptoms.  Your family or friends see changes in your behavior.  You feel sick to your stomach (nauseous) or you throw up (vomit).  You have a pins and needles feeling or you are numb in part of your body. Get  help right away if:  You have trouble moving or talking.  You are always dizzy.  You pass out (faint).  You get very bad headaches.  You feel weak or have trouble using your hands, arms, or legs.  You have changes in your hearing.  You have changes in your seeing (vision).  You get a stiff neck.  Bright light starts to bother you. This information is not intended to replace advice given to you by your health care provider. Make sure you discuss any questions you have with your health care provider. Document Released: 01/10/2008 Document Revised: 09/08/2015 Document Reviewed: 07/26/2014 Elsevier Interactive Patient Education  [employer].

## 2018-04-13 NOTE — MAU Provider Note (Addendum)
History     CSN: [id number]  Arrival date and time: 04/13/18 5537   First Provider Initiated Contact with Patient 04/13/18 [id number]      Chief Complaint  Patient presents with  . Nausea  . Dizziness  . Diarrhea   HPI Jennifer Castaneda is a 52 y.o. non pregnant female who presents with nausea, vomiting and diarrhea for 1 hour. She is unsure if she ate something that caused the symptoms or if it is a stomach bug. She states she feels very dizzy and is unsure if that is causing the vomiting or the vomiting is causing the dizziness. She has not tried anything to treat her symptoms. She has chronic hypertension, on medication, and denies headache, shortness of breath or chest pain.   OB History   No obstetric history on file.     Past Medical History:  Diagnosis Date  . Allergy   . Complication of anesthesia   . History of nephrolithiasis   . Hyperparathyroidism ([place])    par[facility]ro[facility] 05/2017 at [facility]  . Hypertension   . Migraine headache   . PONV (postoperative nausea and vomiting)     Past Surgical History:  Procedure Laterality Date  . BREAST SURGERY     cyst  . CESAREAN SECTION    . COLONOSCOPY WITH PROPOFOL N/A 11/26/2017   Procedure: COLONOSCOPY WITH PROPOFOL;  Surgeon: Lin Landsman, MD;  Location: [facility] ENDOSCOPY;  Service: Gastroenterology;  Laterality: N/A;  . left superior parathyroidectomy     2019 at [facility] Dr. Carmin Muskrat  . NASAL SINUS SURGERY    . PARATHYROIDECTOMY      Family History  Problem Relation Age of Onset  . Heart disease Mother   . Hypertension Mother   . Cancer Neg Hx     Social History   Tobacco Use  . Smoking status: Former Smoker    Last attempt to quit: 08/14/2004    Years since quitting: 13.6  . Smokeless tobacco: Never Used  Substance Use Topics  . Alcohol use: No  . Drug use: No    Allergies:  Allergies  Allergen Reactions  . Aspartame And Phenylalanine Swelling  . Phenylalanine Anaphylaxis and  Swelling  . Asa [Aspirin] Hives  . Penicillins Hives  . Skelaxin [Metaxalone] Nausea And Vomiting    Medications Prior to Admission  Medication Sig Dispense Refill Last Dose  . acetaminophen (TYLENOL) 500 MG tablet Take 500 mg by mouth.   Past Month at Unknown time  . amLODipine (NORVASC) 5 MG tablet TAKE 1 TABLET (5 MG TOTAL) BY MOUTH DAILY. **NEED OFFICE VISIT AND LABS** 90 tablet 0 04/12/2018 at Unknown time  . ibuprofen (ADVIL,MOTRIN) 800 MG tablet Take 1 tablet (800 mg total) by mouth every 8 (eight) hours as needed. 30 tablet 0 Past Month at Unknown time  . Multiple Vitamin (MULTIVITAMIN) tablet Take 1 tablet by mouth daily.   Past Week at Unknown time  . amitriptyline (ELAVIL) 25 MG tablet Take 1 tablet (25 mg total) by mouth at bedtime. 30 tablet 0   . butalbital-acetaminophen-caffeine (FIORICET, ESGIC) 50-325-40 MG tablet TAKE 1 TABLET BY MOUTH TWICE DAILY AS NEEDED FOR HEADACHE   Taking  . lisinopril (PRINIVIL,ZESTRIL) 40 MG tablet Take 1 tablet (40 mg total) by mouth daily. 90 tablet 0 Taking    Review of Systems  Constitutional: Negative.  Negative for fatigue and fever.  HENT: Negative.   Respiratory: Negative.  Negative for shortness of breath.   Cardiovascular: Negative.  Negative for chest pain.  Gastrointestinal: Positive for diarrhea, nausea and vomiting. Negative for abdominal pain and constipation.  Genitourinary: Negative.  Negative for dysuria.  Neurological: Positive for dizziness. Negative for headaches.   Physical Exam   Blood pressure (!) 120/58, pulse 73, temperature 98.4 F (36.9 C), resp. rate 18, last menstrual period 04/16/2012.  Physical Exam  Nursing note and vitals reviewed. Constitutional: She is oriented to person, place, and time. She appears well-developed and well-nourished. No distress.  HENT:  Head: Normocephalic.  Eyes: Pupils are equal, round, and reactive to light.  Cardiovascular: Normal rate, regular rhythm and normal heart sounds.   Respiratory: Effort normal and breath sounds normal. No respiratory distress.  GI: Soft. Bowel sounds are normal. She exhibits no distension. There is no abdominal tenderness.  Neurological: She is alert and oriented to person, place, and time.  Skin: Skin is warm and dry.  Psychiatric: She has a normal mood and affect. Her behavior is normal. Judgment and thought content normal.    MAU Course  Procedures Results for orders placed or performed during the hospital encounter of 04/13/18 (from the past 24 hour(s))  Urinalysis, Routine w reflex microscopic     Status: Abnormal   Collection Time: 04/13/18  6:45 AM  Result Value Ref Range   Color, Urine YELLOW YELLOW   APPearance CLOUDY (A) CLEAR   Specific Gravity, Urine 1.012 1.005 - 1.030   pH 9.0 (H) 5.0 - 8.0   Glucose, UA NEGATIVE NEGATIVE mg/dL   Hgb urine dipstick NEGATIVE NEGATIVE   Bilirubin Urine NEGATIVE NEGATIVE   Ketones, ur NEGATIVE NEGATIVE mg/dL   Protein, ur NEGATIVE NEGATIVE mg/dL   Nitrite NEGATIVE NEGATIVE   Leukocytes, UA NEGATIVE NEGATIVE  CBC     Status: None   Collection Time: 04/13/18  7:11 AM  Result Value Ref Range   WBC 5.3 4.0 - 10.5 K/uL   RBC 4.52 3.87 - 5.11 MIL/uL   Hemoglobin 13.2 12.0 - 15.0 g/dL   HCT 40.9 36.0 - 46.0 %   MCV 90.5 80.0 - 100.0 fL   MCH 29.2 26.0 - 34.0 pg   MCHC 32.3 30.0 - 36.0 g/dL   RDW 12.9 11.5 - 15.5 %   Platelets 232 150 - 400 K/uL   nRBC 0.0 0.0 - 0.2 %  Comprehensive metabolic panel     Status: Abnormal   Collection Time: 04/13/18  7:11 AM  Result Value Ref Range   Sodium 139 135 - 145 mmol/L   Potassium 3.7 3.5 - 5.1 mmol/L   Chloride 106 98 - 111 mmol/L   CO2 25 22 - 32 mmol/L   Glucose, Bld 116 (H) 70 - 99 mg/dL   BUN 20 6 - 20 mg/dL   Creatinine, Ser 0.89 0.44 - 1.00 mg/dL   Calcium 9.4 8.9 - 10.3 mg/dL   Total Protein 7.9 6.5 - 8.1 g/dL   Albumin 4.4 3.5 - 5.0 g/dL   AST 31 15 - 41 U/L   ALT 20 0 - 44 U/L   Alkaline Phosphatase 108 38 - 126 U/L    Total Bilirubin 0.8 0.3 - 1.2 mg/dL   GFR calc non Af Amer >60 >60 mL/min   GFR calc Af Amer >60 >60 mL/min   Anion gap 8 5 - 15     MDM UA LR bolus Zofran 4mg  IV CBC, CMP D5LR Phenergan IV  No episodes of vomiting or diarrhea. Patient reporting constant nausea and dizziness.  Care turned over to E.  Madeline Bebout NP at 0800. Wende Mott, CNM 04/13/18  Meclizine 50 mg PO given for dizziness. Pt reports improvement in symptoms after IV fluids & meds. Stable for discharge. Will rx meclizine & zofran. If symptoms worsen, pt instructed to go to nearest ED. If symptoms continue, can f/u with her PCP Assessment and Plan  A:  1. Gastroenteritis, acute   2. Dizziness    P: Discharge home Rx meclizine & zofran Plenty of fluids at home F/u with PCP or local ED prn  Jorje Guild, NP

## 2018-06-26 ENCOUNTER — Encounter: Payer: Self-pay | Admitting: Family Medicine

## 2018-06-26 DIAGNOSIS — I10 Essential (primary) hypertension: Secondary | ICD-10-CM

## 2018-06-27 MED ORDER — AMLODIPINE BESYLATE 5 MG PO TABS: ORAL_TABLET | ORAL | 0 refills | Status: DC

## 2018-07-01 ENCOUNTER — Encounter: Payer: Self-pay | Admitting: Family Medicine

## 2018-07-01 ENCOUNTER — Other Ambulatory Visit: Payer: 59

## 2018-07-03 ENCOUNTER — Other Ambulatory Visit: Payer: Self-pay

## 2018-07-03 ENCOUNTER — Other Ambulatory Visit: Payer: 59

## 2018-07-03 DIAGNOSIS — Z13220 Encounter for screening for lipoid disorders: Secondary | ICD-10-CM | POA: Diagnosis not present

## 2018-07-03 DIAGNOSIS — I10 Essential (primary) hypertension: Secondary | ICD-10-CM

## 2018-07-04 LAB — COMPREHENSIVE METABOLIC PANEL
AG Ratio: 1.6 (calc) (ref 1.0–2.5)
ALKALINE PHOSPHATASE (APISO): 105 U/L (ref 37–153)
ALT: 11 U/L (ref 6–29)
AST: 19 U/L (ref 10–35)
Albumin: 4.2 g/dL (ref 3.6–5.1)
BUN: 12 mg/dL (ref 7–25)
CO2: 28 mmol/L (ref 20–32)
CREATININE: 0.9 mg/dL (ref 0.50–1.05)
Calcium: 9.6 mg/dL (ref 8.6–10.4)
Chloride: 106 mmol/L (ref 98–110)
Globulin: 2.7 g/dL (calc) (ref 1.9–3.7)
Glucose, Bld: 84 mg/dL (ref 65–99)
Potassium: 4.5 mmol/L (ref 3.5–5.3)
Sodium: 142 mmol/L (ref 135–146)
Total Bilirubin: 0.4 mg/dL (ref 0.2–1.2)
Total Protein: 6.9 g/dL (ref 6.1–8.1)

## 2018-07-04 LAB — CBC WITH DIFFERENTIAL/PLATELET
Absolute Monocytes: 335 cells/uL (ref 200–950)
BASOS PCT: 1 %
Basophils Absolute: 50 cells/uL (ref 0–200)
EOS PCT: 5.9 %
Eosinophils Absolute: 295 cells/uL (ref 15–500)
HEMATOCRIT: 37.2 % (ref 35.0–45.0)
HEMOGLOBIN: 12.5 g/dL (ref 11.7–15.5)
LYMPHS ABS: 1555 {cells}/uL (ref 850–3900)
MCH: 29.5 pg (ref 27.0–33.0)
MCHC: 33.6 g/dL (ref 32.0–36.0)
MCV: 87.7 fL (ref 80.0–100.0)
MPV: 10.9 fL (ref 7.5–12.5)
Monocytes Relative: 6.7 %
NEUTROS ABS: 2765 {cells}/uL (ref 1500–7800)
NEUTROS PCT: 55.3 %
Platelets: 243 10*3/uL (ref 140–400)
RBC: 4.24 10*6/uL (ref 3.80–5.10)
RDW: 12.3 % (ref 11.0–15.0)
Total Lymphocyte: 31.1 %
WBC: 5 10*3/uL (ref 3.8–10.8)

## 2018-07-04 LAB — LIPID PANEL
CHOL/HDL RATIO: 2.7 (calc) (ref <5.0)
CHOLESTEROL: 186 mg/dL (ref <200)
HDL: 68 mg/dL (ref >=50)
LDL CHOLESTEROL (CALC): 97 mg/dL
Non-HDL Cholesterol (Calc): 118 mg/dL (calc) (ref <130)
Triglycerides: 109 mg/dL (ref <150)

## 2018-08-04 ENCOUNTER — Encounter: Payer: Self-pay | Admitting: Family Medicine

## 2018-08-05 MED ORDER — AMLODIPINE BESYLATE 5 MG PO TABS: ORAL_TABLET | ORAL | 0 refills | Status: DC

## 2018-10-08 ENCOUNTER — Encounter: Payer: Self-pay | Admitting: Family Medicine

## 2018-10-08 ENCOUNTER — Other Ambulatory Visit: Payer: Self-pay

## 2018-10-08 ENCOUNTER — Ambulatory Visit: Payer: 59 | Admitting: Family Medicine

## 2018-10-08 VITALS — BP 124/76 | HR 91 | Temp 98.1°F | Resp 18 | Ht 69.5 in | Wt 171.0 lb

## 2018-10-08 DIAGNOSIS — R319 Hematuria, unspecified: Secondary | ICD-10-CM

## 2018-10-08 LAB — URINALYSIS, ROUTINE W REFLEX MICROSCOPIC
Bacteria, UA: NONE SEEN /HPF
Bilirubin Urine: NEGATIVE
Glucose, UA: NEGATIVE
Hyaline Cast: NONE SEEN /LPF
Ketones, ur: NEGATIVE
Nitrite: NEGATIVE
Protein, ur: NEGATIVE
Specific Gravity, Urine: 1.02 (ref 1.001–1.03)
pH: 6 (ref 5.0–8.0)

## 2018-10-08 LAB — MICROSCOPIC MESSAGE

## 2018-10-08 MED ORDER — TAMSULOSIN HCL 0.4 MG PO CAPS: 0.4000 mg | ORAL_CAPSULE | Freq: Every evening | ORAL | 3 refills | Status: DC | PRN

## 2018-10-08 NOTE — Patient Instructions (Signed)
You can take flomax as needed  I will call you with the urine culture  Since you have no symptoms right now we do not need to start antibiotics, but I will be checking for infection and please call me right away if you develop any symptoms.

## 2018-10-08 NOTE — Progress Notes (Signed)
Patient ID: JOLEA DOLLE, female    DOB: Jan 25, 1966, 53 y.o.   MRN: [id number]  PCP: Susy Frizzle, MD  Chief Complaint  Patient presents with  . Hematuria    noticed it on 06/19, no other sx    Subjective:   Jennifer Castaneda is a 53 y.o. female, presents to clinic with CC of hematuria 5 days ago, lasted until last night and no visible hematuria today.  Urine was dark as well.  She works night shift, before onset she worked all night and didn't realize that she hadn't urinated the whole time and when she did there was blood.  She has hx of kidney stone and hyperparathyroid.  She denies flank pain, back pain, abd pain, dysuria, urinary frequency or urgency.  No N, V, fever, chills, sweats or change to appetite.     Patient Active Problem List   Diagnosis Date Noted  . Kidney stones 12/30/2017  . Encounter for screening colonoscopy   . Hyperparathyroidism ([place])   . Hypertension   . Migraines   . History of nephrolithiasis   . Allergy      Prior to Admission medications   Medication Sig Start Date End Date Taking? Authorizing Provider  acetaminophen (TYLENOL) 500 MG tablet Take 500 mg by mouth.   Yes [provider]  amitriptyline (ELAVIL) 25 MG tablet Take 1 tablet (25 mg total) by mouth at bedtime. 12/30/17 10/08/18 Yes Vanga, Tally Due, MD  amLODipine (NORVASC) 5 MG tablet TAKE 1 TABLET (5 MG TOTAL) BY MOUTH DAILY. 08/05/18  Yes Susy Frizzle, MD  meclizine (ANTIVERT) 25 MG tablet Take 1 tablet (25 mg total) by mouth 3 (three) times daily as needed for dizziness. 04/13/18  Yes Jorje Guild, NP  Multiple Vitamin (MULTIVITAMIN) tablet Take 1 tablet by mouth daily.   Yes [provider]  ondansetron (ZOFRAN) 4 MG tablet Take 1 tablet (4 mg total) by mouth every 8 (eight) hours as needed for nausea or vomiting. 04/13/18  Yes Jorje Guild, NP     Allergies  Allergen Reactions  . Aspartame And Phenylalanine Swelling  .  Phenylalanine Anaphylaxis and Swelling  . Asa [Aspirin] Hives  . Penicillins Hives  . Skelaxin [Metaxalone] Nausea And Vomiting     Family History  Problem Relation Age of Onset  . Heart disease Mother   . Hypertension Mother   . Cancer Neg Hx      Social History   Socioeconomic History  . Marital status: Married    Spouse name: Not on file  . Number of children: Not on file  . Years of education: Not on file  . Highest education level: Not on file  Occupational History  . Not on file  Social Needs  . Financial resource strain: Not on file  . Food insecurity    Worry: Not on file    Inability: Not on file  . Transportation needs    Medical: Not on file    Non-medical: Not on file  Tobacco Use  . Smoking status: Former Smoker    Quit date: 08/14/2004    Years since quitting: 14.1  . Smokeless tobacco: Never Used  Substance and Sexual Activity  . Alcohol use: No  . Drug use: No  . Sexual activity: Not on file  Lifestyle  . Physical activity    Days per week: Not on file    Minutes per session: Not on file  . Stress: Not on file  Relationships  .  Social Herbalist on phone: Not on file    Gets together: Not on file    Attends religious service: Not on file    Active member of club or organization: Not on file    Attends meetings of clubs or organizations: Not on file    Relationship status: Not on file  . Intimate partner violence    Fear of current or ex partner: Not on file    Emotionally abused: Not on file    Physically abused: Not on file    Forced sexual activity: Not on file  Other Topics Concern  . Not on file  Social History Narrative  . Not on file     Review of Systems     Objective:    Vitals:   10/08/18 1147  BP: 124/76  Pulse: 91  Resp: 18  Temp: 98.1 F (36.7 C)  SpO2: 99%  Weight: 171 lb (77.6 kg)  Height: 5' 9.5" (1.765 m)      Physical Exam Vitals signs and nursing note reviewed.  Constitutional:       General: She is not in acute distress.    Appearance: Normal appearance. She is well-developed and normal weight. She is not ill-appearing, toxic-appearing or diaphoretic.     Interventions: Face mask in place.  HENT:     Head: Normocephalic and atraumatic.     Right Ear: External ear normal.     Left Ear: External ear normal.     Mouth/Throat:     Pharynx: Uvula midline.  Eyes:     General: Lids are normal. No scleral icterus.       Right eye: No discharge.        Left eye: No discharge.     Conjunctiva/sclera: Conjunctivae normal.     Pupils: Pupils are equal, round, and reactive to light.  Neck:     Musculoskeletal: Normal range of motion and neck supple.     Trachea: Phonation normal. No tracheal deviation.  Cardiovascular:     Rate and Rhythm: Normal rate and regular rhythm.     Pulses: Normal pulses.          Radial pulses are 2+ on the right side and 2+ on the left side.       Posterior tibial pulses are 2+ on the right side and 2+ on the left side.     Heart sounds: Normal heart sounds. No murmur. No friction rub. No gallop.   Pulmonary:     Effort: Pulmonary effort is normal. No respiratory distress.     Breath sounds: Normal breath sounds. No stridor. No wheezing, rhonchi or rales.  Chest:     Chest wall: No tenderness.  Abdominal:     General: Bowel sounds are normal. There is no distension.     Palpations: Abdomen is soft. There is no mass.     Tenderness: There is no abdominal tenderness. There is no right CVA tenderness, left CVA tenderness, guarding or rebound.     Hernia: No hernia is present.     Comments: Normal appearing abdomen, normal BS x 4, soft, NTND, no rebound or guarding, no CVA tenderness  Musculoskeletal:     Right lower leg: No edema.     Left lower leg: No edema.  Lymphadenopathy:     Cervical: No cervical adenopathy.  Skin:    General: Skin is warm and dry.     Coloration: Skin is not pale.  Neurological:  Mental Status: She is alert.      Motor: No abnormal muscle tone.     Coordination: Coordination normal.     Gait: Gait normal.  Psychiatric:        Mood and Affect: Mood normal.        Speech: Speech normal.        Behavior: Behavior normal. Behavior is cooperative.        Assessment & Plan:      ICD-10-CM   1. Hematuria, unspecified type  R31.9 Urinalysis, Routine w reflex microscopic    Urine Culture   hematuria started 5 days ago, resolved this am Urine dip showed 2+ blood and 1+ leuks otherwise unremarkable, I'm waiting for microscopy results Pt otherwise asx, no urinary complaints, pain or vague constitutional sx. Hematuria could possibly from kidney stone that was not very symptomatic?  Out of flomax so I refilled for PRN use Since no urinary sx, so will not start any empiric abx today, not indicated in healthy asx female, will add culture, pt to watch for any sx and asked to notify me, otherwise encouraged hydration. And will f/up with urine culture results   Delsa Grana, PA-C 10/08/18 11:51 AM

## 2018-10-09 LAB — URINE CULTURE
MICRO NUMBER:: 602936
SPECIMEN QUALITY:: ADEQUATE

## 2018-11-28 ENCOUNTER — Encounter: Payer: Self-pay | Admitting: Family Medicine

## 2018-11-28 MED ORDER — AMLODIPINE BESYLATE 5 MG PO TABS: ORAL_TABLET | ORAL | 2 refills | Status: DC

## 2019-03-17 DIAGNOSIS — Z8616 Personal history of COVID-19: Secondary | ICD-10-CM

## 2019-03-17 HISTORY — DX: Personal history of COVID-19: Z86.16

## 2019-03-23 ENCOUNTER — Encounter: Payer: Self-pay | Admitting: Family Medicine

## 2019-05-31 ENCOUNTER — Other Ambulatory Visit: Payer: Self-pay

## 2019-05-31 DIAGNOSIS — N132 Hydronephrosis with renal and ureteral calculous obstruction: Secondary | ICD-10-CM | POA: Diagnosis not present

## 2019-05-31 DIAGNOSIS — R1033 Periumbilical pain: Secondary | ICD-10-CM | POA: Insufficient documentation

## 2019-05-31 DIAGNOSIS — I10 Essential (primary) hypertension: Secondary | ICD-10-CM | POA: Diagnosis not present

## 2019-05-31 DIAGNOSIS — N200 Calculus of kidney: Secondary | ICD-10-CM | POA: Insufficient documentation

## 2019-05-31 DIAGNOSIS — Z79899 Other long term (current) drug therapy: Secondary | ICD-10-CM | POA: Insufficient documentation

## 2019-05-31 LAB — CBC
HCT: 41.7 % (ref 36.0–46.0)
Hemoglobin: 13.5 g/dL (ref 12.0–15.0)
MCH: 28.8 pg (ref 26.0–34.0)
MCHC: 32.4 g/dL (ref 30.0–36.0)
MCV: 89.1 fL (ref 80.0–100.0)
Platelets: 271 10*3/uL (ref 150–400)
RBC: 4.68 MIL/uL (ref 3.87–5.11)
RDW: 12.8 % (ref 11.5–15.5)
WBC: 5.8 10*3/uL (ref 4.0–10.5)
nRBC: 0 % (ref 0.0–0.2)

## 2019-05-31 LAB — BASIC METABOLIC PANEL
Anion gap: 9 (ref 5–15)
BUN: 19 mg/dL (ref 6–20)
CO2: 27 mmol/L (ref 22–32)
Calcium: 10 mg/dL (ref 8.9–10.3)
Chloride: 103 mmol/L (ref 98–111)
Creatinine, Ser: 1.06 mg/dL — ABNORMAL HIGH (ref 0.44–1.00)
GFR calc Af Amer: >60 mL/min (ref >60)
GFR calc non Af Amer: 59 mL/min — ABNORMAL LOW (ref >60)
Glucose, Bld: 111 mg/dL — ABNORMAL HIGH (ref 70–99)
Potassium: 3.7 mmol/L (ref 3.5–5.1)
Sodium: 139 mmol/L (ref 135–145)

## 2019-05-31 MED ORDER — ONDANSETRON HCL 4 MG/2ML IJ SOLN
4.0000 mg | Freq: Once | INTRAMUSCULAR | Status: AC
  Administered 2019-05-31: 4 mg via INTRAVENOUS
  Filled 2019-05-31: qty 2

## 2019-05-31 MED ORDER — FENTANYL CITRATE (PF) 100 MCG/2ML IJ SOLN
50.0000 ug | INTRAMUSCULAR | Status: DC | PRN
  Administered 2019-05-31: 50 ug via INTRAVENOUS
  Filled 2019-05-31: qty 2

## 2019-05-31 NOTE — ED Triage Notes (Signed)
Patient reports pain to right flank radiating into right abdomen for approximately an hour.  Patient reports previous kidney stones and feels similar.

## 2019-06-01 ENCOUNTER — Emergency Department: Payer: 59

## 2019-06-01 ENCOUNTER — Emergency Department
Admission: EM | Admit: 2019-06-01 | Discharge: 2019-06-01 | Disposition: A | Discharge status: Home / Self Care | Payer: 59 | Attending: Emergency Medicine | Admitting: Emergency Medicine

## 2019-06-01 DIAGNOSIS — N200 Calculus of kidney: Secondary | ICD-10-CM | POA: Diagnosis not present

## 2019-06-01 DIAGNOSIS — R1033 Periumbilical pain: Secondary | ICD-10-CM | POA: Diagnosis not present

## 2019-06-01 DIAGNOSIS — I10 Essential (primary) hypertension: Secondary | ICD-10-CM | POA: Diagnosis not present

## 2019-06-01 DIAGNOSIS — N132 Hydronephrosis with renal and ureteral calculous obstruction: Secondary | ICD-10-CM | POA: Diagnosis not present

## 2019-06-01 DIAGNOSIS — Z79899 Other long term (current) drug therapy: Secondary | ICD-10-CM | POA: Diagnosis not present

## 2019-06-01 LAB — URINALYSIS, COMPLETE (UACMP) WITH MICROSCOPIC
Bacteria, UA: NONE SEEN
Bilirubin Urine: NEGATIVE
Glucose, UA: NEGATIVE mg/dL
Ketones, ur: NEGATIVE mg/dL
Leukocytes,Ua: NEGATIVE
Nitrite: NEGATIVE
Protein, ur: 30 mg/dL — AB
RBC / HPF: >50 RBC/hpf — ABNORMAL HIGH (ref 0–5)
Specific Gravity, Urine: 1.02 (ref 1.005–1.030)
pH: 6 (ref 5.0–8.0)

## 2019-06-01 LAB — POCT PREGNANCY, URINE: Preg Test, Ur: NEGATIVE

## 2019-06-01 MED ORDER — TAMSULOSIN HCL 0.4 MG PO CAPS: 0.4000 mg | ORAL_CAPSULE | Freq: Every day | ORAL | 0 refills | Status: DC

## 2019-06-01 MED ORDER — OXYCODONE-ACETAMINOPHEN 5-325 MG PO TABS: 1.0000 | ORAL_TABLET | ORAL | 0 refills | Status: DC | PRN

## 2019-06-01 NOTE — Discharge Instructions (Signed)
Please call the number provided for urology or follow-up with your urologist for evaluation within the next several days.  You have a 5 mm stone in the proximal right ureter.  You may require intervention to eliminate the stone.  Please take your medications as prescribed.  Drink plenty of fluids.  Return to the emergency department for any fever, painful urination, or any other symptom personally concerning to yourself.

## 2019-06-01 NOTE — ED Provider Notes (Signed)
[facility] Emergency Department Provider Note  Time seen: 2:50 AM  I have reviewed the triage vital signs and the nursing notes.   HISTORY  Chief Complaint Flank Pain   HPI Jennifer Castaneda is a 54 y.o. female with a past medical history of kidney stones, hypertension, migraines, presents to the emergency department for right flank pain.  According to the patient this evening she developed acute onset of right flank pain, states she has noticed some intermittent dark urine/hematuria over the past several days but the pain did not start until this evening.  Patient has a history of kidney stones in the past has required lithotripsy as well as ureteral stents in the past.  Denies any fever no dysuria.  States her pain is a 1/10 at this time just described as a dull pain in the right mid abdomen.   Past Medical History:  Diagnosis Date  . Allergy   . Complication of anesthesia   . History of nephrolithiasis   . Hyperparathyroidism ([place])    parathyroidectomy planned for 05/2017 at [facility]  . Hypertension   . Migraine headache   . PONV (postoperative nausea and vomiting)     Patient Active Problem List   Diagnosis Date Noted  . Kidney stones 12/30/2017  . Encounter for screening colonoscopy   . Hyperparathyroidism ([place])   . Hypertension   . Migraines   . History of nephrolithiasis   . Allergy     Past Surgical History:  Procedure Laterality Date  . BREAST SURGERY     cyst  . CESAREAN SECTION    . COLONOSCOPY WITH PROPOFOL N/A 11/26/2017   Procedure: COLONOSCOPY WITH PROPOFOL;  Surgeon: Lin Landsman, MD;  Location: [facility] ENDOSCOPY;  Service: Gastroenterology;  Laterality: N/A;  . left superior parathyroidectomy     2019 at [facility] Dr. Carmin Muskrat  . NASAL SINUS SURGERY    . PARATHYROIDECTOMY      Prior to Admission medications   Medication Sig Start Date End Date Taking? Authorizing Provider  acetaminophen (TYLENOL) 500 MG tablet Take 500  mg by mouth.    [provider]  amitriptyline (ELAVIL) 25 MG tablet Take 1 tablet (25 mg total) by mouth at bedtime. 12/30/17 10/08/18  Lin Landsman, MD  amLODipine (NORVASC) 5 MG tablet TAKE 1 TABLET (5 MG TOTAL) BY MOUTH DAILY. 11/28/18   Susy Frizzle, MD  meclizine (ANTIVERT) 25 MG tablet Take 1 tablet (25 mg total) by mouth 3 (three) times daily as needed for dizziness. 04/13/18   Jorje Guild, NP  Multiple Vitamin (MULTIVITAMIN) tablet Take 1 tablet by mouth daily.    [provider]  ondansetron (ZOFRAN) 4 MG tablet Take 1 tablet (4 mg total) by mouth every 8 (eight) hours as needed for nausea or vomiting. 04/13/18   Jorje Guild, NP  tamsulosin (FLOMAX) 0.4 MG CAPS capsule Take 1 capsule (0.4 mg total) by mouth at bedtime as needed. 10/08/18   Delsa Grana, PA-C    Allergies  Allergen Reactions  . Aspartame And Phenylalanine Swelling  . Phenylalanine Anaphylaxis and Swelling  . Asa [Aspirin] Hives  . Penicillins Hives  . Skelaxin [Metaxalone] Nausea And Vomiting    Family History  Problem Relation Age of Onset  . Heart disease Mother   . Hypertension Mother   . Cancer Neg Hx     Social History Social History   Tobacco Use  . Smoking status: Former Smoker    Quit date: 08/14/2004  Years since quitting: 14.8  . Smokeless tobacco: Never Used  Substance Use Topics  . Alcohol use: No  . Drug use: No    Review of Systems Constitutional: Negative for fever. Cardiovascular: Negative for chest pain. Respiratory: Negative for shortness of breath. Gastrointestinal: Right flank pain. Genitourinary: Dark urine intermittent. Musculoskeletal: Negative for musculoskeletal complaints Neurological: Negative for headache All other ROS negative  ____________________________________________   PHYSICAL EXAM:  VITAL SIGNS: ED Triage Vitals  Enc Vitals Group     BP 05/31/19 2246 (!) 144/94     Pulse Rate 05/31/19 2246 96     Resp 05/31/19 2246  18     Temp 05/31/19 2246 98.4 F (36.9 C)     Temp Source 05/31/19 2246 Oral     SpO2 05/31/19 2246 100 %     Weight 05/31/19 2245 175 lb (79.4 kg)     Height 05/31/19 2245 5\' 10"  (1.778 m)     Head Circumference --      Peak Flow --      Pain Score 05/31/19 2244 8     Pain Loc --      Pain Edu? --      Excl. in [place]? --    Constitutional: Alert and oriented. Well appearing and in no distress. Eyes: Normal exam ENT      Head: Normocephalic and atraumatic.      Mouth/Throat: Mucous membranes are moist. Cardiovascular: Normal rate, regular rhythm.  Respiratory: Normal respiratory effort without tachypnea nor retractions. Breath sounds are clear Gastrointestinal: Soft and nontender. No distention.  Musculoskeletal: Nontender with normal range of motion in all extremities. Neurologic:  Normal speech and language. No gross focal neurologic deficits  Skin:  Skin is warm, dry and intact.  Psychiatric: Mood and affect are normal.  ____________________________________________   RADIOLOGY  CT scan shows a 5 mm proximal right ureteral stone   ____________________________________________   INITIAL IMPRESSION / ASSESSMENT AND PLAN / ED COURSE  Pertinent labs & imaging results that were available during my care of the patient were reviewed by me and considered in my medical decision making (see chart for details).   Patient presents to the emergency department for acute onset of right flank pain with intermittent dark urine.  We will check labs, urinalysis, proceed with CT imaging to evaluate for likely ureterolithiasis.  Differential would also include pyelonephritis, UTI, or other intra-abdominal pathology.  CT confirms proximal 5 mm right ureteral stone.  Urine shows greater than 50 red cells but no signs of bacteria.  Lab work otherwise largely [place].  We will place the patient on pain medication to be used as needed.  Patient will require urology follow-up given the size and  location of the stone.  Discussed my typical kidney stone return precautions.  Florinda L Jeffries-Lopez was evaluated in Emergency Department on 06/01/2019 for the symptoms described in the history of present illness. She was evaluated in the context of the global COVID-19 pandemic, which necessitated consideration that the patient might be at risk for infection with the SARS-CoV-2 virus that causes COVID-19. Institutional protocols and algorithms that pertain to the evaluation of patients at risk for COVID-19 are in a state of rapid change based on information released by regulatory bodies including the CDC and federal and state organizations. These policies and algorithms were followed during the patient's care in the ED.  ____________________________________________   FINAL CLINICAL IMPRESSION(S) / ED DIAGNOSES  Right flank pain Kidney stone   Harvest Dark, MD 06/01/19  0404  

## 2019-06-01 NOTE — ED Notes (Signed)
MD at bedside. 

## 2019-06-02 LAB — URINE CULTURE: Culture: <10000 — AB

## 2019-06-03 DIAGNOSIS — R8271 Bacteriuria: Secondary | ICD-10-CM | POA: Diagnosis not present

## 2019-06-03 DIAGNOSIS — N201 Calculus of ureter: Secondary | ICD-10-CM | POA: Diagnosis not present

## 2019-06-10 DIAGNOSIS — N201 Calculus of ureter: Secondary | ICD-10-CM | POA: Diagnosis not present

## 2019-06-23 DIAGNOSIS — R8271 Bacteriuria: Secondary | ICD-10-CM | POA: Diagnosis not present

## 2019-06-23 DIAGNOSIS — N132 Hydronephrosis with renal and ureteral calculous obstruction: Secondary | ICD-10-CM | POA: Diagnosis not present

## 2019-06-23 DIAGNOSIS — N201 Calculus of ureter: Secondary | ICD-10-CM | POA: Diagnosis not present

## 2019-06-24 ENCOUNTER — Other Ambulatory Visit: Payer: Self-pay | Admitting: Urology

## 2019-06-25 ENCOUNTER — Encounter (HOSPITAL_COMMUNITY)
Admission: RE | Admit: 2019-06-25 | Discharge: 2019-06-25 | Disposition: A | Discharge status: Home / Self Care | Payer: 59 | Source: Ambulatory Visit | Attending: Urology | Admitting: Urology

## 2019-06-25 ENCOUNTER — Encounter (HOSPITAL_COMMUNITY): Payer: Self-pay

## 2019-06-25 ENCOUNTER — Other Ambulatory Visit: Payer: Self-pay

## 2019-06-25 HISTORY — DX: Personal history of urinary calculi: Z87.442

## 2019-06-25 NOTE — Progress Notes (Signed)
PCP - Dr. Raford Pitcher Cardiologist - none  Chest x-ray - no EKG - no Stress Test - no ECHO - no Cardiac Cath -no   Sleep Study - no CPAP -   Fasting Blood Sugar - NA Checks Blood Sugar _____ times a day  Blood Thinner Instructions:NA Aspirin Instructions: Last Dose:  Anesthesia review:   Patient denies shortness of breath, fever, cough and chest pain at PAT appointment yes  Patient verbalized understanding of instructions that were given to them at the PAT appointment. Patient was also instructed that they will need to review over the PAT instructions again at home before surgery. yes

## 2019-06-25 NOTE — Patient Instructions (Addendum)
DUE TO COVID-19 ONLY ONE VISITOR IS ALLOWED TO COME WITH YOU AND STAY IN THE WAITING ROOM ONLY DURING PRE OP AND PROCEDURE DAY OF SURGERY. THE 1 VISITOR MAY VISIT WITH YOU AFTER SURGERY IN YOUR PRIVATE ROOM DURING VISITING HOURS ONLY!  YOU NEED TO HAVE A COVID 19 TEST ON__3/12_____ @_1 :55______, THIS TEST MUST BE DONE BEFORE SURGERY, COME  [address] [place] , [address].  ([place]) ONCE YOUR COVID TEST IS COMPLETED, PLEASE BEGIN THE QUARANTINE INSTRUCTIONS AS OUTLINED IN YOUR HANDOUT.                Ivie L Jeffries-Lopez    Your procedure is scheduled on: 06/30/19   Report to [facility] Main  Entrance   Report to admitting at  9:10 AM     Call this number if you have problems the morning of surgery [phone]    Remember: Do not eat food or drink liquids :After   Midnight. BRUSH YOUR TEETH MORNING OF SURGERY AND RINSE YOUR MOUTH OUT, NO CHEWING GUM CANDY OR MINTS.     Take these medicines the morning of surgery with A SIP OF WATER: Amlodipine                                 You may not have any metal on your body including hair pins and              piercings  Do not wear jewelry, make-up, lotions, powders or perfumes, deodorant             Do not wear nail polish on your fingernails.  Do not shave  48 hours prior to surgery.     Do not bring valuables to the hospital. [place].  Contacts, dentures or bridgework may not be worn into surgery.      Patients discharged the day of surgery will not be allowed to drive home.   IF YOU ARE HAVING SURGERY AND GOING HOME THE SAME DAY, YOU MUST HAVE AN ADULT TO DRIVE YOU HOME AND BE WITH YOU FOR 24 HOURS.   YOU MAY GO HOME BY TAXI OR UBER OR ORTHERWISE, BUT AN ADULT MUST ACCOMPANY YOU HOME AND STAY WITH YOU FOR 24 HOURS.  Name and phone number of your driver:  Special Instructions: N/A              Please read over the following fact sheets  you were given: _____________________________________________________________________             [facility] - Preparing for Surgery  Before surgery, you can play an important role .  Because skin is not sterile, your skin needs to be as free of germs as possible .  You can reduce the number of germs on your skin by washing with CHG (chlorahexidine gluconate) soap before surgery.   CHG is an antiseptic cleaner which kills germs and bonds with the skin to continue killing germs even after washing. Please DO NOT use if you have an allergy to CHG or antibacterial soaps.   If your skin becomes reddened/irritated stop using the CHG and inform your nurse when you arrive at Short Stay. Do not shave (including legs and underarms) for at least 48 hours prior to the first CHG shower.   Please  follow these instructions carefully:  1.  Shower with CHG Soap the night before surgery and the  morning of Surgery.  2.  If you choose to wash your hair, wash your hair first as usual with your  normal  shampoo.  3.  After you shampoo, rinse your hair and body thoroughly to remove the  shampoo.                                        4.  Use CHG as you would any other liquid soap.  You can apply chg directly  to the skin and wash                       Gently with a scrungie or clean washcloth.  5.  Apply the CHG Soap to your body ONLY FROM THE NECK DOWN.   Do not use on face/ open                           Wound or open sores. Avoid contact with eyes, ears mouth and genitals (private parts).                       Wash face,  Genitals (private parts) with your normal soap.             6.  Wash thoroughly, paying special attention to the area where your surgery  will be performed.  7.  Thoroughly rinse your body with warm water from the neck down.  8.  DO NOT shower/wash with your normal soap after using and rinsing off  the CHG Soap.             9.  Pat yourself dry with a clean towel.            10.  Wear  clean pajamas.            11.  Place clean sheets on your bed the night of your first shower and do not  sleep with pets. Day of Surgery : Do not apply any lotions/deodorants the morning of surgery.  Please wear clean clothes to the hospital/surgery center.  FAILURE TO FOLLOW THESE INSTRUCTIONS MAY RESULT IN THE CANCELLATION OF YOUR SURGERY PATIENT SIGNATURE_________________________________  NURSE SIGNATURE__________________________________  ________________________________________________________________________

## 2019-06-26 ENCOUNTER — Other Ambulatory Visit (HOSPITAL_COMMUNITY): Payer: 59

## 2019-06-26 ENCOUNTER — Encounter (HOSPITAL_COMMUNITY)
Admission: RE | Admit: 2019-06-26 | Discharge: 2019-06-26 | Disposition: A | Discharge status: Home / Self Care | Payer: 59 | Source: Ambulatory Visit | Attending: Urology | Admitting: Urology

## 2019-06-26 DIAGNOSIS — I10 Essential (primary) hypertension: Secondary | ICD-10-CM | POA: Diagnosis not present

## 2019-06-26 DIAGNOSIS — Z01818 Encounter for other preprocedural examination: Secondary | ICD-10-CM | POA: Insufficient documentation

## 2019-06-26 LAB — BASIC METABOLIC PANEL
Anion gap: 9 (ref 5–15)
BUN: 18 mg/dL (ref 6–20)
CO2: 28 mmol/L (ref 22–32)
Calcium: 9.4 mg/dL (ref 8.9–10.3)
Chloride: 104 mmol/L (ref 98–111)
Creatinine, Ser: 1.25 mg/dL — ABNORMAL HIGH (ref 0.44–1.00)
GFR calc Af Amer: 56 mL/min — ABNORMAL LOW (ref >60)
GFR calc non Af Amer: 49 mL/min — ABNORMAL LOW (ref >60)
Glucose, Bld: 92 mg/dL (ref 70–99)
Potassium: 3.4 mmol/L — ABNORMAL LOW (ref 3.5–5.1)
Sodium: 141 mmol/L (ref 135–145)

## 2019-06-26 LAB — CBC
HCT: 41.7 % (ref 36.0–46.0)
Hemoglobin: 13.4 g/dL (ref 12.0–15.0)
MCH: 29.1 pg (ref 26.0–34.0)
MCHC: 32.1 g/dL (ref 30.0–36.0)
MCV: 90.5 fL (ref 80.0–100.0)
Platelets: 253 10*3/uL (ref 150–400)
RBC: 4.61 MIL/uL (ref 3.87–5.11)
RDW: 12.6 % (ref 11.5–15.5)
WBC: 7.1 10*3/uL (ref 4.0–10.5)
nRBC: 0 % (ref 0.0–0.2)

## 2019-06-29 ENCOUNTER — Other Ambulatory Visit (HOSPITAL_COMMUNITY)
Admission: RE | Admit: 2019-06-29 | Discharge: 2019-06-29 | Disposition: A | Discharge status: Home / Self Care | Payer: 59 | Source: Ambulatory Visit | Attending: Urology | Admitting: Urology

## 2019-06-29 ENCOUNTER — Other Ambulatory Visit (HOSPITAL_COMMUNITY): Payer: 59

## 2019-06-29 DIAGNOSIS — Z01812 Encounter for preprocedural laboratory examination: Secondary | ICD-10-CM | POA: Diagnosis not present

## 2019-06-29 DIAGNOSIS — U071 COVID-19: Secondary | ICD-10-CM | POA: Insufficient documentation

## 2019-06-29 NOTE — H&P (Signed)
Office Visit Report     06/23/2019   --------------------------------------------------------------------------------   Jennifer Castaneda  [id number]  DOB: 08-Nov-1965, 54 year old Female  SSN: [id number]   PRIMARY CARE:  Cletus Gash T. Dennard Schaumann, MD  REFERRING:  Cammie Mcgee. Dennard Schaumann, MD  PROVIDER:  [name], M.D.  TREATING:  Jiles Crocker, NP  LOCATION:  [facility] [phone]     --------------------------------------------------------------------------------   CC/HPI: Martin Majestic to emergency room 06/01/2019 with some right flank pain and possibly blood in the urine. No fever. She had a CT scan which showed 5 mm stone in the proximal right ureter with moderate hydronephrosis. Small stone left kidney nonobstructing. She had a normal urine culture. On tamsulosin. F/u KUB at [facility] appears to have a 5 mm stone at the level of L4 in the lower 3rd of the vertebral body next to the lower pole the right kidney. Patient chose watchful waiting and medical therapy.   06/10/2019: She returns today and four days ago she had some RLQ discomfort and thought she may have passed the stone. No pain. She hasn't been straining. No gross hematuria. KUB without obvious stone today.   06/23/2019: She returns for f/u with right renal u/s and repeat KUB. Pt denies interval recurrence of colic pain. 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 Grossly voiding at her baseline with stable urgency that is not exacerbated since her diagnosis. She denies any interval burning or painful urination, visible blood in the urine, interval stone material passage. No interval fevers or chills, nausea/vomiting. She stopped tamsulosin after last office visit.     ALLERGIES: aspertame - Anaphylaxis Aspirin TABS - Hives, Nausea, Vomiting, Itching Penicillin - Hives, Itching penicillin - Itching, Hives    MEDICATIONS: Hydrochlorothiazide 12.5 mg tablet  Advil TABS Oral  Allegra TABS Oral   Hydrocodone-Acetaminophen 5 mg-325 mg tablet 1-2 tablet PO Q 6 H  Norvasc 5 mg tablet  Zofran 4 mg tablet 1 tablet PO Q 8 H PRN     GU PSH: Cysto Uretero Lithotripsy - 2011 Cystoscopy Insert Stent - 2011 Locm 300-399Mg /Ml Iodine,1Ml - 2018       PSH Notes: Cystoscopy With Ureteroscopy With Lithotripsy, Cystoscopy With Insertion Of Ureteral Stent Left, Sinus Surgery   NON-GU PSH: Cesarean Delivery     GU PMH: Gross hematuria - 2018 Renal calculus, Kidney stone on left side - 2014 Ureteral calculus, Calculus of ureter - 2014    NON-GU PMH: Personal history of other mental and behavioral disorders, History of depression - 2014 Encounter for general adult medical examination without abnormal findings, Encounter for preventive health examination Hypertension    FAMILY HISTORY: Family Health Status - Mother's Age - Runs In Family Family Health Status Number - Runs In Family Father Deceased At [employer] ___ - Runs In Family   SOCIAL HISTORY: Marital Status: Married Preferred Language: English Current Smoking Status: Patient does not smoke anymore.   Tobacco Use Assessment Completed: Used Tobacco in last 30 days? Does drink.  Drinks 1 caffeinated drink per day. Patient's occupation Research officer, political party.     Notes: Marital History - Currently Married, Caffeine Use, Tobacco Use   REVIEW OF SYSTEMS:    GU Review Female:   Patient reports hard to postpone urination, get up at night to urinate, and leakage of urine. Patient denies frequent urination, burning /pain with urination, stream starts and stops, trouble starting your stream, have to strain to urinate, and being pregnant.  Gastrointestinal (Upper):  Patient denies nausea, vomiting, and indigestion/ heartburn.  Gastrointestinal (Lower):   Patient denies diarrhea and constipation.  Constitutional:   Patient denies fever, night sweats, weight loss, and fatigue.  Skin:   Patient denies itching and skin rash/ lesion.  Eyes:   Patient denies  blurred vision and double vision.  Ears/ Nose/ Throat:   Patient denies sore throat and sinus problems.  Hematologic/Lymphatic:   Patient denies swollen glands and easy bruising.  Cardiovascular:   Patient denies leg swelling and chest pains.  Respiratory:   Patient denies cough and shortness of breath.  Endocrine:   Patient denies excessive thirst.  Musculoskeletal:   Patient denies back pain and joint pain.  Neurological:   Patient denies headaches and dizziness.  Psychologic:   Patient denies depression and anxiety.   VITAL SIGNS:      06/23/2019 09:43 AM  BP 135/85 mmHg  Pulse 67 /min  Temperature 98.0 F / 36.6 C   MULTI-SYSTEM PHYSICAL EXAMINATION:    Constitutional: Well-nourished. No physical deformities. Normally developed. Good grooming.  Neck: Neck symmetrical, not swollen. Normal tracheal position.  Respiratory: No labored breathing, no use of accessory muscles.   Cardiovascular: Normal temperature, normal extremity pulses, no swelling, no varicosities.  Neurologic / Psychiatric: Oriented to time, oriented to place, oriented to person. No depression, no anxiety, no agitation.  Gastrointestinal: No mass, no tenderness, no rigidity, non obese abdomen. Right flank tenderness.  Musculoskeletal: Normal gait and station of head and neck.     PAST DATA REVIEWED:  Source Of History:  Patient, Medical Record Summary  Records Review:   Previous Hospital Records, Previous Patient Records  Urine Test Review:   Urinalysis, Urine Culture  X-Ray Review: KUB: Reviewed Films. Discussed With Patient.  Renal Ultrasound (Limited): Reviewed Films. Discussed With Patient. right C.T. Abdomen/Pelvis: Reviewed Films. Reviewed Report.     06/23/19  Urinalysis  Urine Appearance Clear   Urine Color Yellow   Urine Glucose Neg mg/dL  Urine Bilirubin Neg mg/dL  Urine Ketones Neg mg/dL  Urine Specific Gravity 1.025   Urine Blood Neg ery/uL  Urine pH 6.0   Urine Protein 1+ mg/dL  Urine  Urobilinogen 0.2 mg/dL  Urine Nitrites Neg   Urine Leukocyte Esterase Neg leu/uL  Urine WBC/hpf 6 - 10/hpf   Urine RBC/hpf 0 - 2/hpf   Urine Epithelial Cells 0 - 5/hpf   Urine Bacteria Rare (0-9/hpf)   Urine Mucous Present   Urine Yeast NS (Not Seen)   Urine Trichomonas Not Present   Urine Cystals NS (Not Seen)   Urine Casts NS (Not Seen)   Urine Sperm Not Present    PROCEDURES:         Renal Ultrasound (Limited) [id number]  Kidney: Right Length: 12.6 cm Depth: 6.2 cm Cortical Width: 1.4 cm Width: 4.4 cm    Right Kidney/Ureter:  Moderate hydro. Calcification with shadowing seen mid ureter.  Bladder:  PVR 4.09 ml      Patient confirmed No Neulasta OnPro Device.            KUB - [id number]  A single view of the abdomen is obtained. An obvious right ureteral calculi not identified on today's exam. Sensitivity significantly decreased due to prominent overlying bowel gas and stool pattern tracing down the anatomical expected tract of the right ureter. No other obvious renal or ureteral calculi noted on today's exam.      Patient confirmed No Neulasta OnPro Device.  Urinalysis w/Scope Dipstick Dipstick Cont'd Micro  Color: Yellow Bilirubin: Neg mg/dL WBC/hpf: 6 - 10/hpf  Appearance: Clear Ketones: Neg mg/dL RBC/hpf: 0 - 2/hpf  Specific Gravity: 1.025 Blood: Neg ery/uL Bacteria: Rare (0-9/hpf)  pH: 6.0 Protein: 1+ mg/dL Cystals: NS (Not Seen)  Glucose: Neg mg/dL Urobilinogen: 0.2 mg/dL Casts: NS (Not Seen)    Nitrites: Neg Trichomonas: Not Present    Leukocyte Esterase: Neg leu/uL Mucous: Present      Epithelial Cells: 0 - 5/hpf      Yeast: NS (Not Seen)      Sperm: Not Present    ASSESSMENT:      ICD-10 Details  1 GU:   Ureteral calculus - N20.1 Right, Acute, Uncomplicated  2   Ureteral obstruction secondary to calculous - N13.2 Right, Acute, Uncomplicated   PLAN:            Medications Stop Meds: Hydrocodone-Acetaminophen 5 mg-325 mg tablet 1-2 tablet PO Q 6 H   Start: 06/03/2019  Discontinue: 06/23/2019  - Reason: The medication cycle was completed.  Zofran 4 mg tablet 1 tablet PO Q 8 H PRN  Start: 06/03/2019  Discontinue: 06/23/2019  - Reason: The medication cycle was completed.            Orders Labs Urine Culture          Schedule Return Visit/Planned Activity: Next Available Appointment - Follow up MD, Schedule Surgery          Document Letter(s):  Created for Patient: Clinical Summary         Notes:   Not well seen on KUB but identified on ultrasound today with upstream obstructive signs. She has an obstructing right ureteral calculi. She has some right flank tenderness on exam today but overall her pain has mostly been controlled with no obvious worsening lower urinary tract symptoms. I'll send a urine c/s today to r/o infection. She will need URS to treat the obstructing calculi. She has had this before.  We discussed ureteroscopic stone manipulation with basketing and laser-lithotripsy in detail. We discussed risks including bleeding, infection, damage to kidney / ureter bladder, rarely loss of kidney. We discussed anesthetic risks and rare but serious surgical complications including DVT, PE, MI, and mortality. We specifically addressed that in 5-10% of cases a staged approach is required with stenting followed by re-attempt ureteroscopy if anatomy unfavorable. The patient voiced understanding and wishes to proceed.    Patient has not been taking tamsulosin since last office visit. I told her to resume the medication and hopeful anticipation of possibly passing a calculus before her procedure date. She will continue to monitor for worsening symptomatology including uncontrollable pain, worsening lower urinary tract symptoms, associated constitutional signs/symptoms of systemic infection. In the event she possibly does pass the calculus she will contact the office for follow-up instructions.    * Signed by Jiles Crocker, NP on 06/23/19 at  10:05 AM (EST)*     The information contained in this medical record document is considered private and confidential patient information. This information can only be used for the medical diagnosis and/or medical services that are being provided by the patient's selected caregivers. This information can only be distributed outside of the patient's care if the patient agrees and signs waivers of authorization for this information to be sent to an outside source or route.  Add: Urine cx negative- discussed pt with NP Noberto Retort.

## 2019-06-29 NOTE — H&P (View-Only) (Signed)
Office Visit Report     06/23/2019   --------------------------------------------------------------------------------   Jennifer Castaneda  [id number]  DOB: [birth year], 54 year old Female  SSN: [id number]   PRIMARY CARE:  Cletus Gash T. Dennard Schaumann, MD  REFERRING:  Cammie Mcgee. Dennard Schaumann, MD  PROVIDER:  [name], M.D.  TREATING:  Jiles Crocker, NP  LOCATION:  [facility] [phone]     --------------------------------------------------------------------------------   CC/HPI: Jennifer Castaneda to emergency room 06/01/2019 with some right flank pain and possibly blood in the urine. No fever. She had a CT scan which showed 5 mm stone in the proximal right ureter with moderate hydronephrosis. Small stone left kidney nonobstructing. She had a normal urine culture. On tamsulosin. F/u KUB at [facility] appears to have a 5 mm stone at the level of L4 in the lower 3rd of the vertebral body next to the lower pole the right kidney. Patient chose watchful waiting and medical therapy.   06/10/2019: She returns today and four days ago she had some RLQ discomfort and thought she may have passed the stone. No pain. She hasn't been straining. No gross hematuria. KUB without obvious stone today.   06/23/2019: She returns for f/u with right renal u/s and repeat KUB. Pt denies interval recurrence of colic pain. 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 Grossly voiding at her baseline with stable urgency that is not exacerbated since her diagnosis. She denies any interval burning or painful urination, visible blood in the urine, interval stone material passage. No interval fevers or chills, nausea/vomiting. She stopped tamsulosin after last office visit.     ALLERGIES: aspertame - Anaphylaxis Aspirin TABS - Hives, Nausea, Vomiting, Itching Penicillin - Hives, Itching penicillin - Itching, Hives    MEDICATIONS: Hydrochlorothiazide 12.5 mg tablet  Advil TABS Oral  Allegra TABS Oral   Hydrocodone-Acetaminophen 5 mg-325 mg tablet 1-2 tablet PO Q 6 H  Norvasc 5 mg tablet  Zofran 4 mg tablet 1 tablet PO Q 8 H PRN     GU PSH: Cysto Uretero Lithotripsy - 2011 Cystoscopy Insert Stent - 2011 Locm 300-399Mg /Ml Iodine,1Ml - 2018       PSH Notes: Cystoscopy With Ureteroscopy With Lithotripsy, Cystoscopy With Insertion Of Ureteral Stent Left, Sinus Surgery   NON-GU PSH: Cesarean Delivery     GU PMH: Gross hematuria - 2018 Renal calculus, Kidney stone on left side - 2014 Ureteral calculus, Calculus of ureter - 2014    NON-GU PMH: Personal history of other mental and behavioral disorders, History of depression - 2014 Encounter for general adult medical examination without abnormal findings, Encounter for preventive health examination Hypertension    FAMILY HISTORY: Family Health Status - Mother's Age - Runs In Family Family Health Status Number - Runs In Family Father Deceased At [employer] ___ - Runs In Family   SOCIAL HISTORY: Marital Status: Married Preferred Language: English Current Smoking Status: Patient does not smoke anymore.   Tobacco Use Assessment Completed: Used Tobacco in last 30 days? Does drink.  Drinks 1 caffeinated drink per day. Patient's occupation Research officer, political party.     Notes: Marital History - Currently Married, Caffeine Use, Tobacco Use   REVIEW OF SYSTEMS:    GU Review Female:   Patient reports hard to postpone urination, get up at night to urinate, and leakage of urine. Patient denies frequent urination, burning /pain with urination, stream starts and stops, trouble starting your stream, have to strain to urinate, and being pregnant.  Gastrointestinal (Upper):  Patient denies nausea, vomiting, and indigestion/ heartburn.  Gastrointestinal (Lower):   Patient denies diarrhea and constipation.  Constitutional:   Patient denies fever, night sweats, weight loss, and fatigue.  Skin:   Patient denies itching and skin rash/ lesion.  Eyes:   Patient denies  blurred vision and double vision.  Ears/ Nose/ Throat:   Patient denies sore throat and sinus problems.  Hematologic/Lymphatic:   Patient denies swollen glands and easy bruising.  Cardiovascular:   Patient denies leg swelling and chest pains.  Respiratory:   Patient denies cough and shortness of breath.  Endocrine:   Patient denies excessive thirst.  Musculoskeletal:   Patient denies back pain and joint pain.  Neurological:   Patient denies headaches and dizziness.  Psychologic:   Patient denies depression and anxiety.   VITAL SIGNS:      06/23/2019 09:43 AM  BP 135/85 mmHg  Pulse 67 /min  Temperature 98.0 F / 36.6 C   MULTI-SYSTEM PHYSICAL EXAMINATION:    Constitutional: Well-nourished. No physical deformities. Normally developed. Good grooming.  Neck: Neck symmetrical, not swollen. Normal tracheal position.  Respiratory: No labored breathing, no use of accessory muscles.   Cardiovascular: Normal temperature, normal extremity pulses, no swelling, no varicosities.  Neurologic / Psychiatric: Oriented to time, oriented to place, oriented to person. No depression, no anxiety, no agitation.  Gastrointestinal: No mass, no tenderness, no rigidity, non obese abdomen. Right flank tenderness.  Musculoskeletal: Normal gait and station of head and neck.     PAST DATA REVIEWED:  Source Of History:  Patient, Medical Record Summary  Records Review:   Previous Hospital Records, Previous Patient Records  Urine Test Review:   Urinalysis, Urine Culture  X-Ray Review: KUB: Reviewed Films. Discussed With Patient.  Renal Ultrasound (Limited): Reviewed Films. Discussed With Patient. right C.T. Abdomen/Pelvis: Reviewed Films. Reviewed Report.     06/23/19  Urinalysis  Urine Appearance Clear   Urine Color Yellow   Urine Glucose Neg mg/dL  Urine Bilirubin Neg mg/dL  Urine Ketones Neg mg/dL  Urine Specific Gravity 1.025   Urine Blood Neg ery/uL  Urine pH 6.0   Urine Protein 1+ mg/dL  Urine  Urobilinogen 0.2 mg/dL  Urine Nitrites Neg   Urine Leukocyte Esterase Neg leu/uL  Urine WBC/hpf 6 - 10/hpf   Urine RBC/hpf 0 - 2/hpf   Urine Epithelial Cells 0 - 5/hpf   Urine Bacteria Rare (0-9/hpf)   Urine Mucous Present   Urine Yeast NS (Not Seen)   Urine Trichomonas Not Present   Urine Cystals NS (Not Seen)   Urine Casts NS (Not Seen)   Urine Sperm Not Present    PROCEDURES:         Renal Ultrasound (Limited) [id number]  Kidney: Right Length: 12.6 cm Depth: 6.2 cm Cortical Width: 1.4 cm Width: 4.4 cm    Right Kidney/Ureter:  Moderate hydro. Calcification with shadowing seen mid ureter.  Bladder:  PVR 4.09 ml      Patient confirmed No Neulasta OnPro Device.            KUB - [id number]  A single view of the abdomen is obtained. An obvious right ureteral calculi not identified on today's exam. Sensitivity significantly decreased due to prominent overlying bowel gas and stool pattern tracing down the anatomical expected tract of the right ureter. No other obvious renal or ureteral calculi noted on today's exam.      Patient confirmed No Neulasta OnPro Device.  Urinalysis w/Scope Dipstick Dipstick Cont'd Micro  Color: Yellow Bilirubin: Neg mg/dL WBC/hpf: 6 - 10/hpf  Appearance: Clear Ketones: Neg mg/dL RBC/hpf: 0 - 2/hpf  Specific Gravity: 1.025 Blood: Neg ery/uL Bacteria: Rare (0-9/hpf)  pH: 6.0 Protein: 1+ mg/dL Cystals: NS (Not Seen)  Glucose: Neg mg/dL Urobilinogen: 0.2 mg/dL Casts: NS (Not Seen)    Nitrites: Neg Trichomonas: Not Present    Leukocyte Esterase: Neg leu/uL Mucous: Present      Epithelial Cells: 0 - 5/hpf      Yeast: NS (Not Seen)      Sperm: Not Present    ASSESSMENT:      ICD-10 Details  1 GU:   Ureteral calculus - N20.1 Right, Acute, Uncomplicated  2   Ureteral obstruction secondary to calculous - N13.2 Right, Acute, Uncomplicated   PLAN:            Medications Stop Meds: Hydrocodone-Acetaminophen 5 mg-325 mg tablet 1-2 tablet PO Q 6 H   Start: 06/03/2019  Discontinue: 06/23/2019  - Reason: The medication cycle was completed.  Zofran 4 mg tablet 1 tablet PO Q 8 H PRN  Start: 06/03/2019  Discontinue: 06/23/2019  - Reason: The medication cycle was completed.            Orders Labs Urine Culture          Schedule Return Visit/Planned Activity: Next Available Appointment - Follow up MD, Schedule Surgery          Document Letter(s):  Created for Patient: Clinical Summary         Notes:   Not well seen on KUB but identified on ultrasound today with upstream obstructive signs. She has an obstructing right ureteral calculi. She has some right flank tenderness on exam today but overall her pain has mostly been controlled with no obvious worsening lower urinary tract symptoms. I'll send a urine c/s today to r/o infection. She will need URS to treat the obstructing calculi. She has had this before.  We discussed ureteroscopic stone manipulation with basketing and laser-lithotripsy in detail. We discussed risks including bleeding, infection, damage to kidney / ureter bladder, rarely loss of kidney. We discussed anesthetic risks and rare but serious surgical complications including DVT, PE, MI, and mortality. We specifically addressed that in 5-10% of cases a staged approach is required with stenting followed by re-attempt ureteroscopy if anatomy unfavorable. The patient voiced understanding and wishes to proceed.    Patient has not been taking tamsulosin since last office visit. I told her to resume the medication and hopeful anticipation of possibly passing a calculus before her procedure date. She will continue to monitor for worsening symptomatology including uncontrollable pain, worsening lower urinary tract symptoms, associated constitutional signs/symptoms of systemic infection. In the event she possibly does pass the calculus she will contact the office for follow-up instructions.    * Signed by Jiles Crocker, NP on 06/23/19 at  10:05 AM (EST)*     The information contained in this medical record document is considered private and confidential patient information. This information can only be used for the medical diagnosis and/or medical services that are being provided by the patient's selected caregivers. This information can only be distributed outside of the patient's care if the patient agrees and signs waivers of authorization for this information to be sent to an outside source or route.  Add: Urine cx negative- discussed pt with NP Noberto Retort.

## 2019-06-30 ENCOUNTER — Other Ambulatory Visit: Payer: Self-pay | Admitting: Urology

## 2019-06-30 ENCOUNTER — Ambulatory Visit (HOSPITAL_COMMUNITY): Admission: RE | Admit: 2019-06-30 | Payer: 59 | Source: Home / Self Care | Admitting: Urology

## 2019-06-30 ENCOUNTER — Telehealth (HOSPITAL_COMMUNITY): Payer: Self-pay | Admitting: *Deleted

## 2019-06-30 ENCOUNTER — Encounter (HOSPITAL_COMMUNITY): Payer: Self-pay | Admitting: Certified Registered"

## 2019-06-30 ENCOUNTER — Encounter (HOSPITAL_COMMUNITY): Admission: RE | Payer: Self-pay | Source: Home / Self Care

## 2019-06-30 LAB — SARS CORONAVIRUS 2 (TAT 6-24 HRS): SARS Coronavirus 2: POSITIVE — AB

## 2019-06-30 SURGERY — CYSTOSCOPY, WITH CALCULUS REMOVAL USING BASKET
Anesthesia: General | Laterality: Right

## 2019-06-30 NOTE — Progress Notes (Signed)
Patient with positive covid result. Contacted MD and informed of result.   

## 2019-07-02 ENCOUNTER — Encounter (HOSPITAL_BASED_OUTPATIENT_CLINIC_OR_DEPARTMENT_OTHER): Payer: Self-pay | Admitting: Urology

## 2019-07-02 ENCOUNTER — Other Ambulatory Visit: Payer: Self-pay

## 2019-07-02 NOTE — Progress Notes (Signed)
Spoke with:  Murrell NPO:  After Midnight, no gum, candy, or mints   Arrival time: 0530AM Lab needs dos----ISTAT 8           Lab results------EKG 06/26/19 IN EPIC COVID test ------POSITIVE 06/29/19 NO RETEST Medications to take morning of surgery: AMLODIPINE Pre op orders in epic YES Diabetic medication -----N/A Patient Special Instructions -----N/A Pre-Op special Istructions -----N/A Ride home: [place] (daughter) [phone]  Patient verbalized understanding of instructions that were given at this phone interview. Patient denies shortness of breath, chest pain, fever, cough a this phone interview.

## 2019-07-10 ENCOUNTER — Ambulatory Visit (HOSPITAL_BASED_OUTPATIENT_CLINIC_OR_DEPARTMENT_OTHER): Payer: 59 | Admitting: Anesthesiology

## 2019-07-10 ENCOUNTER — Encounter (HOSPITAL_BASED_OUTPATIENT_CLINIC_OR_DEPARTMENT_OTHER)
Admission: RE | Disposition: A | Discharge status: Home / Self Care | Payer: Self-pay | Source: Home / Self Care | Attending: Urology

## 2019-07-10 ENCOUNTER — Encounter (HOSPITAL_BASED_OUTPATIENT_CLINIC_OR_DEPARTMENT_OTHER): Payer: Self-pay | Admitting: Urology

## 2019-07-10 ENCOUNTER — Other Ambulatory Visit: Payer: Self-pay

## 2019-07-10 ENCOUNTER — Ambulatory Visit (HOSPITAL_BASED_OUTPATIENT_CLINIC_OR_DEPARTMENT_OTHER)
Admission: RE | Admit: 2019-07-10 | Discharge: 2019-07-10 | Disposition: A | Discharge status: Home / Self Care | Payer: 59 | Attending: Urology | Admitting: Urology

## 2019-07-10 DIAGNOSIS — Z791 Long term (current) use of non-steroidal anti-inflammatories (NSAID): Secondary | ICD-10-CM | POA: Insufficient documentation

## 2019-07-10 DIAGNOSIS — N201 Calculus of ureter: Secondary | ICD-10-CM | POA: Diagnosis not present

## 2019-07-10 DIAGNOSIS — N132 Hydronephrosis with renal and ureteral calculous obstruction: Secondary | ICD-10-CM | POA: Insufficient documentation

## 2019-07-10 DIAGNOSIS — I10 Essential (primary) hypertension: Secondary | ICD-10-CM | POA: Diagnosis not present

## 2019-07-10 DIAGNOSIS — Z79899 Other long term (current) drug therapy: Secondary | ICD-10-CM | POA: Diagnosis not present

## 2019-07-10 DIAGNOSIS — Z87891 Personal history of nicotine dependence: Secondary | ICD-10-CM | POA: Insufficient documentation

## 2019-07-10 DIAGNOSIS — Z880 Allergy status to penicillin: Secondary | ICD-10-CM | POA: Diagnosis not present

## 2019-07-10 DIAGNOSIS — G43909 Migraine, unspecified, not intractable, without status migrainosus: Secondary | ICD-10-CM | POA: Diagnosis not present

## 2019-07-10 DIAGNOSIS — Z886 Allergy status to analgesic agent status: Secondary | ICD-10-CM | POA: Insufficient documentation

## 2019-07-10 HISTORY — DX: Presence of spectacles and contact lenses: Z97.3

## 2019-07-10 HISTORY — PX: CYSTOSCOPY/URETEROSCOPY/HOLMIUM LASER/STENT PLACEMENT: SHX6546

## 2019-07-10 LAB — POCT I-STAT, CHEM 8
BUN: 17 mg/dL (ref 6–20)
Calcium, Ion: 1.23 mmol/L (ref 1.15–1.40)
Chloride: 106 mmol/L (ref 98–111)
Creatinine, Ser: 1 mg/dL (ref 0.44–1.00)
Glucose, Bld: 98 mg/dL (ref 70–99)
HCT: 37 % (ref 36.0–46.0)
Hemoglobin: 12.6 g/dL (ref 12.0–15.0)
Potassium: 3.2 mmol/L — ABNORMAL LOW (ref 3.5–5.1)
Sodium: 142 mmol/L (ref 135–145)
TCO2: 26 mmol/L (ref 22–32)

## 2019-07-10 SURGERY — CYSTOSCOPY/URETEROSCOPY/HOLMIUM LASER/STENT PLACEMENT
Anesthesia: General | Site: Ureter | Laterality: Right

## 2019-07-10 MED ORDER — LIDOCAINE 2% (20 MG/ML) 5 ML SYRINGE
INTRAMUSCULAR | Status: AC
  Filled 2019-07-10: qty 5

## 2019-07-10 MED ORDER — STERILE WATER FOR IRRIGATION IR SOLN
Status: DC | PRN
  Administered 2019-07-10: 500 mL

## 2019-07-10 MED ORDER — DEXAMETHASONE SODIUM PHOSPHATE 10 MG/ML IJ SOLN
INTRAMUSCULAR | Status: AC
  Filled 2019-07-10: qty 1

## 2019-07-10 MED ORDER — SCOPOLAMINE 1 MG/3DAYS TD PT72
MEDICATED_PATCH | TRANSDERMAL | Status: AC
  Filled 2019-07-10: qty 1

## 2019-07-10 MED ORDER — SODIUM CHLORIDE 0.9 % IR SOLN
Status: DC | PRN
  Administered 2019-07-10: 3000 mL

## 2019-07-10 MED ORDER — MIDAZOLAM HCL 5 MG/5ML IJ SOLN
INTRAMUSCULAR | Status: DC | PRN
  Administered 2019-07-10: 2 mg via INTRAVENOUS

## 2019-07-10 MED ORDER — PROPOFOL 10 MG/ML IV BOLUS
INTRAVENOUS | Status: AC
  Filled 2019-07-10: qty 20

## 2019-07-10 MED ORDER — CIPROFLOXACIN IN D5W 400 MG/200ML IV SOLN
INTRAVENOUS | Status: AC
  Filled 2019-07-10: qty 200

## 2019-07-10 MED ORDER — FENTANYL CITRATE (PF) 100 MCG/2ML IJ SOLN
INTRAMUSCULAR | Status: AC
  Filled 2019-07-10: qty 2

## 2019-07-10 MED ORDER — FENTANYL CITRATE (PF) 100 MCG/2ML IJ SOLN
INTRAMUSCULAR | Status: DC | PRN
  Administered 2019-07-10: 25 ug via INTRAVENOUS
  Administered 2019-07-10: 50 ug via INTRAVENOUS
  Administered 2019-07-10: 25 ug via INTRAVENOUS
  Administered 2019-07-10 (×2): 50 ug via INTRAVENOUS

## 2019-07-10 MED ORDER — PROPOFOL 500 MG/50ML IV EMUL: INTRAVENOUS | Status: DC | PRN

## 2019-07-10 MED ORDER — SUCCINYLCHOLINE CHLORIDE 20 MG/ML IJ SOLN
INTRAMUSCULAR | Status: DC | PRN
  Administered 2019-07-10: 30 mg via INTRAVENOUS
  Administered 2019-07-10: 70 mg via INTRAVENOUS

## 2019-07-10 MED ORDER — ACETAMINOPHEN 500 MG PO TABS
ORAL_TABLET | ORAL | Status: AC
  Filled 2019-07-10: qty 2

## 2019-07-10 MED ORDER — ONDANSETRON HCL 4 MG/2ML IJ SOLN
INTRAMUSCULAR | Status: DC | PRN
  Administered 2019-07-10: 4 mg via INTRAVENOUS

## 2019-07-10 MED ORDER — ACETAMINOPHEN 500 MG PO TABS
1000.0000 mg | ORAL_TABLET | Freq: Once | ORAL | Status: AC
  Administered 2019-07-10: 1000 mg via ORAL
  Filled 2019-07-10: qty 2

## 2019-07-10 MED ORDER — MIDAZOLAM HCL 2 MG/2ML IJ SOLN
INTRAMUSCULAR | Status: AC
  Filled 2019-07-10: qty 2

## 2019-07-10 MED ORDER — IOHEXOL 300 MG/ML  SOLN
INTRAMUSCULAR | Status: DC | PRN
  Administered 2019-07-10: 7 mL

## 2019-07-10 MED ORDER — DEXAMETHASONE SODIUM PHOSPHATE 4 MG/ML IJ SOLN
INTRAMUSCULAR | Status: DC | PRN
  Administered 2019-07-10: 10 mg via INTRAVENOUS

## 2019-07-10 MED ORDER — PROPOFOL 10 MG/ML IV BOLUS
INTRAVENOUS | Status: DC | PRN
  Administered 2019-07-10: 50 mg via INTRAVENOUS
  Administered 2019-07-10: 150 mg via INTRAVENOUS
  Administered 2019-07-10: 100 mg via INTRAVENOUS
  Administered 2019-07-10: 50 mg via INTRAVENOUS
  Administered 2019-07-10: 100 mg via INTRAVENOUS
  Administered 2019-07-10: 50 mg via INTRAVENOUS

## 2019-07-10 MED ORDER — ONDANSETRON HCL 4 MG/2ML IJ SOLN
INTRAMUSCULAR | Status: AC
  Filled 2019-07-10: qty 2

## 2019-07-10 MED ORDER — SUCCINYLCHOLINE CHLORIDE 200 MG/10ML IV SOSY
PREFILLED_SYRINGE | INTRAVENOUS | Status: AC
  Filled 2019-07-10: qty 10

## 2019-07-10 MED ORDER — SCOPOLAMINE 1 MG/3DAYS TD PT72
MEDICATED_PATCH | TRANSDERMAL | Status: DC | PRN
  Administered 2019-07-10: 1 via TRANSDERMAL

## 2019-07-10 MED ORDER — PHENYLEPHRINE 40 MCG/ML (10ML) SYRINGE FOR IV PUSH (FOR BLOOD PRESSURE SUPPORT)
PREFILLED_SYRINGE | INTRAVENOUS | Status: AC
  Filled 2019-07-10: qty 20

## 2019-07-10 MED ORDER — PROPOFOL 500 MG/50ML IV EMUL
INTRAVENOUS | Status: DC | PRN
  Administered 2019-07-10: 150 ug/kg/min via INTRAVENOUS

## 2019-07-10 MED ORDER — CIPROFLOXACIN IN D5W 400 MG/200ML IV SOLN
400.0000 mg | Freq: Once | INTRAVENOUS | Status: AC
  Administered 2019-07-10: 08:00:00 400 mg via INTRAVENOUS
  Filled 2019-07-10: qty 200

## 2019-07-10 MED ORDER — CIPROFLOXACIN IN D5W 400 MG/200ML IV SOLN
400.0000 mg | Freq: Once | INTRAVENOUS | Status: DC
  Filled 2019-07-10: qty 200

## 2019-07-10 MED ORDER — PROPOFOL 500 MG/50ML IV EMUL
INTRAVENOUS | Status: AC
  Filled 2019-07-10: qty 50

## 2019-07-10 MED ORDER — PROPOFOL 10 MG/ML IV BOLUS
INTRAVENOUS | Status: AC
  Filled 2019-07-10: qty 40

## 2019-07-10 MED ORDER — LACTATED RINGERS IV SOLN
INTRAVENOUS | Status: DC
  Filled 2019-07-10: qty 1000

## 2019-07-10 SURGICAL SUPPLY — 24 items
BAG DRAIN URO-CYSTO SKYTR STRL (DRAIN) ×3 IMPLANT
CATH URET 5FR 28IN CONE TIP (BALLOONS) ×3
CATH URET 5FR 28IN OPEN ENDED (CATHETERS) ×3 IMPLANT
CATH URET 5FR 70CM CONE TIP (BALLOONS) ×1 IMPLANT
CATH URET DUAL LUMEN 6-10FR 50 (CATHETERS) IMPLANT
CLOTH BEACON ORANGE TIMEOUT ST (SAFETY) ×3 IMPLANT
FIBER LASER TRAC TIP (UROLOGICAL SUPPLIES) ×3 IMPLANT
GLOVE BIO SURGEON STRL SZ7.5 (GLOVE) ×3 IMPLANT
GLOVE BIO SURGEON STRL SZ8 (GLOVE) IMPLANT
GOWN STRL REUS W/TWL LRG LVL3 (GOWN DISPOSABLE) ×6 IMPLANT
GUIDEWIRE ANG ZIPWIRE 038X150 (WIRE) ×3 IMPLANT
GUIDEWIRE STR DUAL SENSOR (WIRE) ×3 IMPLANT
GUIDEWIRE ZIPWRE .038 STRAIGHT (WIRE) IMPLANT
IV NS IRRIG 3000ML ARTHROMATIC (IV SOLUTION) ×3 IMPLANT
KIT TURNOVER CYSTO (KITS) ×3 IMPLANT
MANIFOLD NEPTUNE II (INSTRUMENTS) ×3 IMPLANT
NS IRRIG 500ML POUR BTL (IV SOLUTION) IMPLANT
PACK CYSTO (CUSTOM PROCEDURE TRAY) ×3 IMPLANT
SHEATH URETERAL 12FRX28CM (UROLOGICAL SUPPLIES) ×3 IMPLANT
STENT URET 6FRX26 CONTOUR (STENTS) ×3 IMPLANT
TUBE CONNECTING 12'X1/4 (SUCTIONS) ×1
TUBE CONNECTING 12X1/4 (SUCTIONS) ×2 IMPLANT
TUBING UROLOGY SET (TUBING) ×3 IMPLANT
WATER STERILE IRR 500ML POUR (IV SOLUTION) ×3 IMPLANT

## 2019-07-10 NOTE — Transfer of Care (Signed)
Immediate Anesthesia Transfer of Care Note  Patient: Jennifer Castaneda  Procedure(s) Performed: Procedure(s) (LRB): CYSTOSCOPY/RETROGRADE/URETEROSCOPY/HOLMIUM LASER/ BASKET STONE EXTRACTION/STENT PLACEMENT (Right)  Patient Location: PACU  Anesthesia Type: General  Level of Consciousness: awake, sedated, patient cooperative and responds to stimulation  Airway & Oxygen Therapy: Patient Spontanous Breathing and Patient connected to [place] 02 soft FM   Post-op Assessment: Report given to PACU RN, Post -op Vital signs reviewed and stable and Patient moving all extremities  Post vital signs: Reviewed and stable  Complications: No apparent anesthesia complications

## 2019-07-10 NOTE — Interval H&P Note (Signed)
History and Physical Interval Note:  07/10/2019 7:28 AM  Jennifer Castaneda  has presented today for surgery, with the diagnosis of RIGHT URETERAL CALCULI.  The various methods of treatment have been discussed with the patient and family. After consideration of risks, benefits and other options for treatment, the patient has consented to  Procedure(s): CYSTOSCOPY/RETROGRADE/URETEROSCOPY/HOLMIUM LASER/ BASKET STONE EXTRACTION/STENT PLACEMENT (Right) as a surgical intervention.  The patient's history has been reviewed, patient examined, no change in status, stable for surgery. She has been well with no dysuria or fever. No cough or SOB. Discussed she may need a staged procedure. She still has some right flank pain and hasn't seen a stone pass. Discussed stent with string if we get stone out and f/u US. I have reviewed the patient's chart and labs.  Questions were answered to the patient's satisfaction.     [name]

## 2019-07-10 NOTE — Anesthesia Preprocedure Evaluation (Addendum)
Anesthesia Evaluation  Patient identified by MRN, date of birth, ID band Patient awake    Reviewed: Allergy & Precautions, NPO status , Patient's Chart, lab work & pertinent test results  History of Anesthesia Complications (+) PONV  Airway Mallampati: I  TM Distance: >3 FB Neck ROM: Full    Dental no notable dental hx. (+) Teeth Intact, Dental Advisory Given   Pulmonary neg pulmonary ROS, former smoker,  COVID positive 03/2019, still positive 06/29/19, no symptoms   Pulmonary exam normal breath sounds clear to auscultation       Cardiovascular hypertension, Pt. on medications negative cardio ROS Normal cardiovascular exam Rhythm:Regular Rate:Normal     Neuro/Psych negative neurological ROS  negative psych ROS   GI/Hepatic negative GI ROS, Neg liver ROS,   Endo/Other  negative endocrine ROS  Renal/GU negative Renal ROS  negative genitourinary   Musculoskeletal negative musculoskeletal ROS (+)   Abdominal   Peds  Hematology negative hematology ROS (+)   Anesthesia Other Findings   Reproductive/Obstetrics                           Anesthesia Physical Anesthesia Plan  ASA: II  Anesthesia Plan: General   Post-op Pain Management:    Induction: Intravenous  PONV Risk Score and Plan: 4 or greater and Ondansetron, Dexamethasone, Midazolam, Scopolamine patch - Pre-op and TIVA  Airway Management Planned: LMA  Additional Equipment:   Intra-op Plan:   Post-operative Plan: Extubation in OR  Informed Consent: I have reviewed the patients History and Physical, chart, labs and discussed the procedure including the risks, benefits and alternatives for the proposed anesthesia with the patient or authorized representative who has indicated his/her understanding and acceptance.     Dental advisory given  Plan Discussed with: CRNA  Anesthesia Plan Comments:        Anesthesia Quick  Evaluation

## 2019-07-10 NOTE — Op Note (Signed)
Preoperative diagnosis: right ureteral stone   Postoperative diagnosis: right ureteral stone  Procedure: cystoscopy, right retrograde pyelogram, right ureteroscopy, laser lithotripsy and stone basket extraction  Surgeon: Junious Silk  Anesthesia: Gen  Indication: Ms. Jennifer Castaneda is a 54 year old female with a right proximal stone persistent since February.  She has not passed today and on follow-up ultrasound in the office she had mild to moderate right hydroureteronephrosis down to where the stone was visible in the proximal ureter.  Findings: Right retrograde pyelogram-ureter appeared normal up to filling defect in the proximal ureter with some mild hydronephrosis proximally.  These findings were consistent with the stone.  On ureteroscopy stone was noted in the right proximal ureter with some significant inflammation with edema and erythema at the impaction site.  Description of procedure: After consent was obtained patient brought to the operating room.  After adequate anesthesia she was placed in lithotomy position and prepped and draped in the usual sterile fashion.  A timeout was performed to confirm the patient and procedure.  The cystoscope was passed per urethra and the bladder inspected.  She had a grade 1 cystocele.  Right ureteral orifice was difficult to cannulate and I used a cone-tip catheter and retrograde injection of contrast was performed.  I then passed a sensor wire pulled out in the collecting system.  The semirigid single channel ureteroscope was then advanced up the ureter into the proximal ureter without difficulty.  The stone was then fragmented with 4 of the largest fragments passing proximally.  2 of the fragments were removed without difficulty, but the other 2 went more proximal near the UPJ.  I could not angle the scope inferior enough to get the basket to grab these.  Therefore the scope was backed out and I used the access sheath to get a second wire up.  Access  sheath went without difficulty.  I then went adjacent to the Glidewire with the access sheath and passed the single channel digital scope and the other fragments were noted in the right renal pelvis.  These were grasped and removed.  No other stone fragments were noted.  The access sheath was backed out on the ureteroscope and the collecting system renal pelvis and ureter inspected again and noted to be normal without injury or stone fragment.  Stone impaction site was irritated but otherwise appeared normal.  The wire was backloaded on the cystoscope and a 6 x 26 cm stent advanced.  The wire was removed with a good coil seen in the collecting system and a good coil in the bladder.  The bladder was drained and the scope removed.  She was awakened and taken to the recovery room in stable condition.  Complications: none  EBL: minimal  Specimens: stone fragments to patient  Drains: 6 x 26 cm right ureteral stent with tether / string  Disposition: patient stable to PACU -I called and spoke to [facility] and went over the procedure, findings and postop care.

## 2019-07-10 NOTE — Anesthesia Procedure Notes (Signed)
Procedure Name: Intubation Date/Time: 07/10/2019 7:55 AM Performed by: Justice Rocher, CRNA Pre-anesthesia Checklist: Patient identified, Emergency Drugs available, Suction available, Patient being monitored and Timeout performed Patient Re-evaluated:Patient Re-evaluated prior to induction Oxygen Delivery Method: Circle system utilized Preoxygenation: Pre-oxygenation with 100% oxygen Induction Type: IV induction Ventilation: Mask ventilation without difficulty Laryngoscope Size: Mac and 3 Grade View: Grade II Tube type: Oral Tube size: 7.0 mm Number of attempts: 1 Airway Equipment and Method: Stylet and Oral airway Placement Confirmation: ETT inserted through vocal cords under direct vision,  positive ETCO2 and breath sounds checked- equal and bilateral Secured at: 23 cm Tube secured with: Tape Dental Injury: Teeth and Oropharynx as per pre-operative assessment and Bloody posterior oropharynx  Difficulty Due To: Difficult Airway- due to limited oral opening and Difficulty was unanticipated Comments: Pre02 , Smooth IV induction. Attempted LMA #4 placement, unable to insert and place, limited mouth opening. Removed LMA  - noted blood tinged on LMA. Elected to use Glidescope to visualize. DL w/ Mac # 3 - Elected to intubate # 7.0 ETT placed without difficulty. No obvious trauma around oropharynx Dr Lanetta Inch assessing. Small soft gauze inserted as bite block.

## 2019-07-10 NOTE — Discharge Instructions (Signed)
Ureteral Stent Implantation, Care After This sheet gives you information about how to care for yourself after your procedure. Your health care provider may also give you more specific instructions. If you have problems or questions, contact your health care provider.  Removal of the stent:  Remove the stent by pulling the string on Monday morning, July 13, 2019.   What can I expect after the procedure? After the procedure, it is common to have:  Nausea.  Mild pain when you urinate. You may feel this pain in your lower back or lower abdomen. The pain should stop within a few minutes after you urinate. This may last for up to 1 week.  A small amount of blood in your urine for several days. Follow these instructions at home: Medicines  Take over-the-counter and prescription medicines only as told by your health care provider.  If you were prescribed an antibiotic medicine, take it as told by your health care provider. Do not stop taking the antibiotic even if you start to feel better.  Do not drive for 24 hours if you were given a sedative during your procedure.  Ask your health care provider if the medicine prescribed to you requires you to avoid driving or using heavy machinery. Activity  Rest as told by your health care provider.  Avoid sitting for a long time without moving. Get up to take short walks every 1-2 hours. This is important to improve blood flow and breathing. Ask for help if you feel weak or unsteady.  Return to your normal activities as told by your health care provider. Ask your health care provider what activities are safe for you. General instructions   Watch for any blood in your urine. Call your health care provider if the amount of blood in your urine increases.  If you have a catheter: ? Follow instructions from your health care provider about taking care of your catheter and collection bag. ? Do not take baths, swim, or use a hot tub until your health care  provider approves. Ask your health care provider if you may take showers. You may only be allowed to take sponge baths.  Drink enough fluid to keep your urine pale yellow.  Do not use any products that contain nicotine or tobacco, such as cigarettes, e-cigarettes, and chewing tobacco. These can delay healing after surgery. If you need help quitting, ask your health care provider.  Keep all follow-up visits as told by your health care provider. This is important. Contact a health care provider if:  You have pain that gets worse or does not get better with medicine, especially pain when you urinate.  You have difficulty urinating.  You feel nauseous or you vomit repeatedly during a period of more than 2 days after the procedure. Get help right away if:  Your urine is dark red or has blood clots in it.  You are leaking urine (have incontinence).  The end of the stent comes out of your urethra.  You cannot urinate.  You have sudden, sharp, or severe pain in your abdomen or lower back.  You have a fever.  You have swelling or pain in your legs.  You have difficulty breathing. Summary  After the procedure, it is common to have mild pain when you urinate that goes away within a few minutes after you urinate. This may last for up to 1 week.  Watch for any blood in your urine. Call your health care provider if the amount of  blood in your urine increases.  Take over-the-counter and prescription medicines only as told by your health care provider.  Drink enough fluid to keep your urine pale yellow. This information is not intended to replace advice given to you by your health care provider. Make sure you discuss any questions you have with your health care provider. Document Revised: 01/07/2018 Document Reviewed: 01/08/2018 Elsevier Patient Education  [place] Instructions  Activity: Get plenty of rest for the remainder of the day. A  responsible individual must stay with you for 24 hours following the procedure.  For the next 24 hours, DO NOT: -Drive a car -Paediatric nurse -Drink alcoholic beverages -Take any medication unless instructed by your physician -Make any legal decisions or sign important papers.  Meals: Start with liquid foods such as gelatin or soup. Progress to regular foods as tolerated. Avoid greasy, spicy, heavy foods. If nausea and/or vomiting occur, drink only clear liquids until the nausea and/or vomiting subsides. Call your physician if vomiting continues.  Special Instructions/Symptoms: Your throat may feel dry or sore from the anesthesia or the breathing tube placed in your throat during surgery. If this causes discomfort, gargle with warm salt water. The discomfort should disappear within 24 hours.  If you had a scopolamine patch placed behind your ear for the management of post- operative nausea and/or vomiting:  1. The medication in the patch is effective for 72 hours, after which it should be removed.  Wrap patch in a tissue and discard in the trash. Wash hands thoroughly with soap and water. 2. You may remove the patch earlier than 72 hours if you experience unpleasant side effects which may include dry mouth, dizziness or visual disturbances. 3. Avoid touching the patch. Wash your hands with soap and water after contact with the patch.

## 2019-07-14 DIAGNOSIS — N201 Calculus of ureter: Secondary | ICD-10-CM | POA: Diagnosis not present

## 2019-07-14 DIAGNOSIS — R1084 Generalized abdominal pain: Secondary | ICD-10-CM | POA: Diagnosis not present

## 2019-07-14 NOTE — Anesthesia Postprocedure Evaluation (Signed)
Anesthesia Post Note  Patient: Jennifer Castaneda  Procedure(s) Performed: CYSTOSCOPY/RETROGRADE/URETEROSCOPY/HOLMIUM LASER/ BASKET STONE EXTRACTION/STENT PLACEMENT (Right Ureter)     Patient location during evaluation: PACU Anesthesia Type: General Level of consciousness: awake and alert Pain management: pain level controlled Vital Signs Assessment: post-procedure vital signs reviewed and stable Respiratory status: spontaneous breathing, nonlabored ventilation, respiratory function stable and patient connected to nasal cannula oxygen Cardiovascular status: blood pressure returned to baseline and stable Postop Assessment: no apparent nausea or vomiting Anesthetic complications: no    Last Vitals:  Vitals:   07/10/19 1000 07/10/19 1030  BP: 127/75 120/76  Pulse: 60   Resp: 16 20  Temp:  36.7 C  SpO2: 100% 100%    Last Pain:  Vitals:   07/13/19 1008  TempSrc:   PainSc: 3    Pain Goal: Patients Stated Pain Goal: 2 (07/10/19 0603)                 Chelsey L Woodrum

## 2019-07-22 DIAGNOSIS — R310 Gross hematuria: Secondary | ICD-10-CM | POA: Diagnosis not present

## 2019-07-22 DIAGNOSIS — N200 Calculus of kidney: Secondary | ICD-10-CM | POA: Diagnosis not present

## 2019-07-23 ENCOUNTER — Encounter: Payer: Self-pay | Admitting: Family Medicine

## 2019-07-23 ENCOUNTER — Other Ambulatory Visit: Payer: Self-pay

## 2019-07-23 ENCOUNTER — Other Ambulatory Visit: Payer: Self-pay | Admitting: Family Medicine

## 2019-07-23 ENCOUNTER — Ambulatory Visit: Payer: 59 | Admitting: Family Medicine

## 2019-07-23 VITALS — BP 144/86 | HR 88 | Temp 96.7°F | Resp 14 | Ht 70.0 in | Wt 178.0 lb

## 2019-07-23 DIAGNOSIS — N3289 Other specified disorders of bladder: Secondary | ICD-10-CM | POA: Diagnosis not present

## 2019-07-23 MED ORDER — MIRABEGRON ER 25 MG PO TB24: 25.0000 mg | ORAL_TABLET | Freq: Every day | ORAL | 1 refills | Status: DC

## 2019-07-23 MED ORDER — AMLODIPINE BESYLATE 10 MG PO TABS: 10.0000 mg | ORAL_TABLET | Freq: Every day | ORAL | 3 refills | Status: DC

## 2019-07-23 NOTE — Progress Notes (Signed)
Subjective:    Patient ID: Jennifer Castaneda, female    DOB: [birth year], 54 y.o.   MRN: [id number]  HPI  Patient recently required ureteral stent due to nephrolithiasis along with hydronephrosis right kidney.  Patient's stent was removed in late February.  However since that time she has had persistent hematuria.  She also reports frequent bladder spasms and increased urinary frequency with dysuria.  She was treated symptomatically with Azo by her urologist assuming that the irritation and bladder spasms with improved gradually with time.  Recently was seen by urology and was found to have bacteria as well as hematuria on the urinalysis.  She was started empirically on Keflex and urine culture is pending at this time.  However she continues to have frequent bladder spasms, increased urinary frequency and urgency as well as dysuria.  I performed a urinalysis at the urologist and per the patient's report she was found to have bladder wall thickening consistent with inflammation.  She was told that this was most likely due to her ureteral stent although she is being empirically treated for urinary tract infection.  I also gave her Ditropan in addition to the Azo however despite using these medications for more than a week she has seen no benefit in the frequency or urgency. Past Medical History:  Diagnosis Date  . Allergy   . Complication of anesthesia    Pt is Native American  . History of COVID-19 03/17/2019   retested 06/29/2019 positive result  . History of kidney stones   . History of nephrolithiasis   . Hyperparathyroidism ([place])    parathyroidectomy planned for 05/2017 at [facility]  . Hypertension   . Migraine headache   . PONV (postoperative nausea and vomiting)   . Wears glasses    Past Surgical History:  Procedure Laterality Date  . BREAST SURGERY     cyst  . CESAREAN SECTION    . COLONOSCOPY WITH PROPOFOL N/A 11/26/2017   Procedure: COLONOSCOPY WITH PROPOFOL;  Surgeon: Lin Landsman, MD;  Location: [facility] ENDOSCOPY;  Service: Gastroenterology;  Laterality: N/A;  . CYSTOSCOPY/URETEROSCOPY/HOLMIUM LASER/STENT PLACEMENT Right 07/10/2019   Procedure: CYSTOSCOPY/RETROGRADE/URETEROSCOPY/HOLMIUM LASER/ BASKET STONE EXTRACTION/STENT PLACEMENT;  Surgeon: [name], MD;  Location: [facility];  Service: Urology;  Laterality: Right;  . left superior parathyroidectomy     2019 at [facility] Dr. Carmin Muskrat  . NASAL SINUS SURGERY    . PARATHYROIDECTOMY     Current Outpatient Medications on File Prior to Visit  Medication Sig Dispense Refill  . acetaminophen (TYLENOL) 500 MG tablet Take 500-1,000 mg by mouth every 6 (six) hours as needed for moderate pain or headache.     Marland Kitchen amLODipine (NORVASC) 5 MG tablet TAKE 1 TABLET (5 MG TOTAL) BY MOUTH DAILY. (Patient taking differently: Take 5 mg by mouth daily. ) 90 tablet 2  . APPLE CIDER VINEGAR PO Take 1 capsule by mouth daily.    . cephALEXin (KEFLEX) 500 MG capsule Take 500 mg by mouth 2 (two) times daily.    Marland Kitchen GARCINIA CAMBOGIA-CHROMIUM PO Take 1 capsule by mouth daily.    . Melatonin 3 MG CAPS Take 3-6 mg by mouth at bedtime as needed (sleep).    . Methen-Hyosc-Meth Blue-Na Phos (UROGESIC-BLUE) 81.6 MG TABS 1 tab qid    . Multiple Vitamin (MULTIVITAMIN) tablet Take 2 tablets by mouth daily.     Marland Kitchen OVER THE COUNTER MEDICATION Take 1-2 tablets by mouth daily. hydroxycut    . oxybutynin (DITROPAN) 5  MG tablet Take 5 mg by mouth 2 (two) times daily.    Marland Kitchen amitriptyline (ELAVIL) 25 MG tablet Take 1 tablet (25 mg total) by mouth at bedtime. (Patient not taking: Reported on 06/24/2019) 30 tablet 0  . oxyCODONE-acetaminophen (PERCOCET) 5-325 MG tablet Take 1 tablet by mouth every 4 (four) hours as needed for severe pain. (Patient not taking: Reported on 06/24/2019) 20 tablet 0   No current facility-administered medications on file prior to visit.   Allergies  Allergen Reactions  . Aspartame And Phenylalanine Swelling    . Other Nausea And Vomiting    general anesthesia - severe nausea and vomiting   . Phenylalanine Anaphylaxis and Swelling  . Asa [Aspirin] Hives and Nausea And Vomiting  . Penicillins Hives    Did it involve swelling of the face/tongue/throat, SOB, or low BP? No Did it involve sudden or severe rash/hives, skin peeling, or any reaction on the inside of your mouth or nose? No Did you need to seek medical attention at a hospital or doctor's office? No When did it last happen?10+ years If all above answers are "NO", may proceed with cephalosporin use.   Jonah Blue [Metaxalone] Nausea And Vomiting   Social History   Socioeconomic History  . Marital status: Married    Spouse name: Not on file  . Number of children: Not on file  . Years of education: Not on file  . Highest education level: Not on file  Occupational History  . Not on file  Tobacco Use  . Smoking status: Former Smoker    Quit date: 08/14/2004    Years since quitting: 14.9  . Smokeless tobacco: Never Used  Substance and Sexual Activity  . Alcohol use: No  . Drug use: No  . Sexual activity: Not on file  Other Topics Concern  . Not on file  Social History Narrative  . Not on file   Social Determinants of Health   Financial Resource Strain:   . Difficulty of Paying Living Expenses:   Food Insecurity:   . Worried About Charity fundraiser in the Last Year:   . Arboriculturist in the Last Year:   Transportation Needs:   . Film/video editor (Medical):   Marland Kitchen Lack of Transportation (Non-Medical):   Physical Activity:   . Days of Exercise per Week:   . Minutes of Exercise per Session:   Stress:   . Feeling of Stress :   Social Connections:   . Frequency of Communication with Friends and Family:   . Frequency of Social Gatherings with Friends and Family:   . Attends Religious Services:   . Active Member of Clubs or Organizations:   . Attends Archivist Meetings:   Marland Kitchen Marital Status:   Intimate  Partner Violence:   . Fear of Current or Ex-Partner:   . Emotionally Abused:   Marland Kitchen Physically Abused:   . Sexually Abused:      Review of Systems  All other systems reviewed and are negative.      Objective:   Physical Exam Vitals reviewed.  Constitutional:      General: She is not in acute distress.    Appearance: Normal appearance. She is normal weight. She is not ill-appearing or toxic-appearing.  Cardiovascular:     Rate and Rhythm: Normal rate and regular rhythm.     Heart sounds: Normal heart sounds. No murmur.  Pulmonary:     Effort: Pulmonary effort is normal.  Breath sounds: Normal breath sounds.  Abdominal:     General: Abdomen is flat. There is no distension.     Palpations: Abdomen is soft.     Tenderness: There is no abdominal tenderness.  Neurological:     Mental Status: She is alert.           Assessment & Plan:  Bladder spasms  Patient reportedly has bladder wall thickening on ultrasound.  She has been empirically treated with Keflex for urinary tract infection that showed bacteria and hematuria.  Urine culture is pending.  Stent has been removed.  She is tried and is still trying Ditropan once daily along with Azo and Keflex.  I will add Myrbetriq 25 mg a day to the Ditropan to see if this will help calm the bladder spasms further and then reassess in 1 to 2 weeks.  If bladder spasms have improved at that point and urine culture is negative, I would recommend repeating ultrasound to ensure bladder wall thickening is improving.

## 2019-08-04 DIAGNOSIS — N200 Calculus of kidney: Secondary | ICD-10-CM | POA: Diagnosis not present

## 2019-12-07 ENCOUNTER — Other Ambulatory Visit: Payer: Self-pay | Admitting: *Deleted

## 2020-01-13 DIAGNOSIS — Z20828 Contact with and (suspected) exposure to other viral communicable diseases: Secondary | ICD-10-CM | POA: Diagnosis not present

## 2020-04-26 ENCOUNTER — Ambulatory Visit: Payer: 59 | Attending: Internal Medicine

## 2020-04-26 ENCOUNTER — Other Ambulatory Visit: Payer: Self-pay | Admitting: Internal Medicine

## 2020-04-26 DIAGNOSIS — Z23 Encounter for immunization: Secondary | ICD-10-CM

## 2020-08-19 ENCOUNTER — Ambulatory Visit (INDEPENDENT_AMBULATORY_CARE_PROVIDER_SITE_OTHER): Payer: 59 | Admitting: Family Medicine

## 2020-08-19 ENCOUNTER — Other Ambulatory Visit: Payer: Self-pay

## 2020-08-19 ENCOUNTER — Encounter: Payer: Self-pay | Admitting: Family Medicine

## 2020-08-19 VITALS — BP 120/64 | HR 82 | Temp 98.0°F | Resp 16 | Ht 70.0 in | Wt 181.0 lb

## 2020-08-19 DIAGNOSIS — Z1231 Encounter for screening mammogram for malignant neoplasm of breast: Secondary | ICD-10-CM

## 2020-08-19 DIAGNOSIS — M21611 Bunion of right foot: Secondary | ICD-10-CM

## 2020-08-19 DIAGNOSIS — Z0001 Encounter for general adult medical examination with abnormal findings: Secondary | ICD-10-CM | POA: Diagnosis not present

## 2020-08-19 DIAGNOSIS — Z124 Encounter for screening for malignant neoplasm of cervix: Secondary | ICD-10-CM | POA: Diagnosis not present

## 2020-08-19 DIAGNOSIS — Z0000 Encounter for general adult medical examination without abnormal findings: Secondary | ICD-10-CM | POA: Diagnosis not present

## 2020-08-19 NOTE — Progress Notes (Signed)
Subjective:    Patient ID: Jennifer Castaneda, female    DOB: 06/03/1965, 55 y.o.   MRN: [id number]  HPI   Colonoscopy 2019- q 5 years. Mammogram 2019 Pap-2019 Patient is a very sweet 55 year old female here today for physical exam.  Her last colonoscopy was in 2019.  They recommended a repeat colonoscopy in 5 years.  Her last mammogram was in 2019 and this is due.  Her last Pap smear was normal but was in 2019 and this is due as well.  She has had all 3 doses of the COVID-vaccine.  She is due for a shingles vaccine at her convenience.  She denies any family history of osteoporosis and therefore she does not require a bone density test until age 78.  She does complain of pain over her first MTP joint.  She has a valgus deformity of the great toe.  The toe is sore at night.  It aches and throbs.  It appears to be a bunion.  She has normal dorsalis pedis pulses and normal sensation in the foot. Past Medical History:  Diagnosis Date  . Allergy   . Complication of anesthesia    Pt is Native American  . History of COVID-19 03/17/2019   retested 06/29/2019 positive result  . History of kidney stones   . History of nephrolithiasis   . Hyperparathyroidism ([place])    parathyroidectomy planned for 05/2017 at [facility]  . Hypertension   . Migraine headache   . PONV (postoperative nausea and vomiting)   . Wears glasses    Past Surgical History:  Procedure Laterality Date  . BREAST SURGERY     cyst  . CESAREAN SECTION    . COLONOSCOPY WITH PROPOFOL N/A 11/26/2017   Procedure: COLONOSCOPY WITH PROPOFOL;  Surgeon: Lin Landsman, MD;  Location: [facility] ENDOSCOPY;  Service: Gastroenterology;  Laterality: N/A;  . CYSTOSCOPY/URETEROSCOPY/HOLMIUM LASER/STENT PLACEMENT Right 07/10/2019   Procedure: CYSTOSCOPY/RETROGRADE/URETEROSCOPY/HOLMIUM LASER/ BASKET STONE EXTRACTION/STENT PLACEMENT;  Surgeon: [name], MD;  Location: [facility];  Service: Urology;  Laterality: Right;  .  left superior parathyroidectomy     2019 at [facility] Dr. Carmin Muskrat  . NASAL SINUS SURGERY    . PARATHYROIDECTOMY     Current Outpatient Medications on File Prior to Visit  Medication Sig Dispense Refill  . acetaminophen (TYLENOL) 500 MG tablet Take 500-1,000 mg by mouth every 6 (six) hours as needed for moderate pain or headache.     Marland Kitchen amitriptyline (ELAVIL) 25 MG tablet Take 1 tablet (25 mg total) by mouth at bedtime. (Patient not taking: Reported on 06/24/2019) 30 tablet 0  . amLODipine (NORVASC) 10 MG tablet TAKE 1 TABLET (10 MG TOTAL) BY MOUTH DAILY. 90 tablet 3  . amLODipine (NORVASC) 5 MG tablet TAKE 1 TABLET (5 MG TOTAL) BY MOUTH DAILY. (Patient taking differently: Take 5 mg by mouth daily. ) 90 tablet 2  . APPLE CIDER VINEGAR PO Take 1 capsule by mouth daily.    . cephALEXin (KEFLEX) 500 MG capsule Take 500 mg by mouth 2 (two) times daily.    Marland Kitchen COVID-19 mRNA vaccine, Pfizer, 30 MCG/0.3ML injection USE AS DIRECTED .3 mL 0  . GARCINIA CAMBOGIA-CHROMIUM PO Take 1 capsule by mouth daily.    . Melatonin 3 MG CAPS Take 3-6 mg by mouth at bedtime as needed (sleep).    . Methen-Hyosc-Meth Blue-Na Phos (UROGESIC-BLUE) 81.6 MG TABS 1 tab qid    . mirabegron ER (MYRBETRIQ) 25 MG TB24 tablet Take 1 tablet (  25 mg total) by mouth daily. 30 tablet 1  . Multiple Vitamin (MULTIVITAMIN) tablet Take 2 tablets by mouth daily.     Marland Kitchen OVER THE COUNTER MEDICATION Take 1-2 tablets by mouth daily. hydroxycut    . oxybutynin (DITROPAN) 5 MG tablet Take 5 mg by mouth 2 (two) times daily.    Marland Kitchen oxyCODONE-acetaminophen (PERCOCET) 5-325 MG tablet Take 1 tablet by mouth every 4 (four) hours as needed for severe pain. (Patient not taking: Reported on 06/24/2019) 20 tablet 0   No current facility-administered medications on file prior to visit.   Allergies  Allergen Reactions  . Aspartame And Phenylalanine Swelling  . Other Nausea And Vomiting    general anesthesia - severe nausea and vomiting   . Phenylalanine  Anaphylaxis and Swelling  . Asa [Aspirin] Hives and Nausea And Vomiting  . Penicillins Hives    Did it involve swelling of the face/tongue/throat, SOB, or low BP? No Did it involve sudden or severe rash/hives, skin peeling, or any reaction on the inside of your mouth or nose? No Did you need to seek medical attention at a hospital or doctor's office? No When did it last happen?10+ years If all above answers are "NO", may proceed with cephalosporin use.   Jonah Blue [Metaxalone] Nausea And Vomiting   Social History   Socioeconomic History  . Marital status: Married    Spouse name: Not on file  . Number of children: Not on file  . Years of education: Not on file  . Highest education level: Not on file  Occupational History  . Not on file  Tobacco Use  . Smoking status: Former Smoker    Quit date: 08/14/2004    Years since quitting: 16.0  . Smokeless tobacco: Never Used  Vaping Use  . Vaping Use: Never used  Substance and Sexual Activity  . Alcohol use: No  . Drug use: No  . Sexual activity: Not on file  Other Topics Concern  . Not on file  Social History Narrative  . Not on file   Social Determinants of Health   Financial Resource Strain: Not on file  Food Insecurity: Not on file  Transportation Needs: Not on file  Physical Activity: Not on file  Stress: Not on file  Social Connections: Not on file  Intimate Partner Violence: Not on file    Review of Systems  All other systems reviewed and are negative.      Objective:   Physical Exam Vitals reviewed. Exam conducted with a chaperone present.  Constitutional:      General: She is not in acute distress.    Appearance: She is well-developed. She is not diaphoretic.  HENT:     Head: Normocephalic and atraumatic.     Right Ear: External ear normal.     Left Ear: External ear normal.     Nose: Nose normal.     Mouth/Throat:     Pharynx: No oropharyngeal exudate.  Eyes:     General: No scleral icterus.        Right eye: No discharge.        Left eye: No discharge.     Conjunctiva/sclera: Conjunctivae normal.     Pupils: Pupils are equal, round, and reactive to light.  Neck:     Thyroid: No thyromegaly.     Vascular: No JVD.     Trachea: No tracheal deviation.  Cardiovascular:     Rate and Rhythm: Normal rate and regular rhythm.  Heart sounds: Normal heart sounds. No murmur heard. No friction rub. No gallop.   Pulmonary:     Effort: Pulmonary effort is normal. No respiratory distress.     Breath sounds: Normal breath sounds. No stridor. No wheezing or rales.  Abdominal:     General: Bowel sounds are normal. There is no distension.     Palpations: Abdomen is soft. There is no mass.     Tenderness: There is no abdominal tenderness. There is no guarding or rebound.     Hernia: There is no hernia in the left inguinal area.  Genitourinary:    Labia:        Right: No rash, tenderness or lesion.        Left: No rash, tenderness or lesion.      Vagina: Normal.     Cervix: No cervical motion tenderness, discharge or friability.     Uterus: Normal.      Adnexa:        Right: No mass, tenderness or fullness.         Left: No mass, tenderness or fullness.    Musculoskeletal:     Cervical back: Normal range of motion and neck supple.  Lymphadenopathy:     Cervical: No cervical adenopathy.     Lower Body: No right inguinal adenopathy. No left inguinal adenopathy.  Skin:    General: Skin is warm.     Coloration: Skin is not pale.     Findings: No erythema or rash.  Neurological:     Mental Status: She is alert and oriented to person, place, and time.     Cranial Nerves: No cranial nerve deficit.     Motor: No abnormal muscle tone.     Coordination: Coordination normal.     Deep Tendon Reflexes: Reflexes are normal and symmetric.  Psychiatric:        Behavior: Behavior normal.        Thought Content: Thought content normal.        Judgment: Judgment normal.            Assessment & Plan:  General medical exam - Plan: CBC with Differential/Platelet, COMPLETE METABOLIC PANEL WITH GFR, Lipid panel  Encounter for screening mammogram for malignant neoplasm of breast - Plan: MM Digital Screening  Cervical cancer screening - Plan: PAP, Thin Prep w/HPV rflx HPV Type 16/18  Physical exam is completely normal aside from a bunion first MTP joint with valgus deformity of the hallux.  Schedule patient for mammogram.  Pap smear was sent to pathology in a labeled container.  Blood pressure is excellent.  Check CBC, CMP, lipid panel.  Immunizations are up-to-date although I did recommend shingles vaccine.  We discussed treatment for bunion including a wider toe box, low heels, NSAIDs, or referral to podiatry to discuss surgical options.

## 2020-08-20 LAB — LIPID PANEL
Cholesterol: 210 mg/dL — ABNORMAL HIGH (ref <200)
HDL: 72 mg/dL (ref >=50)
LDL Cholesterol (Calc): 121 mg/dL (calc) — ABNORMAL HIGH
Non-HDL Cholesterol (Calc): 138 mg/dL (calc) — ABNORMAL HIGH (ref <130)
Total CHOL/HDL Ratio: 2.9 (calc) (ref <5.0)
Triglycerides: 71 mg/dL (ref <150)

## 2020-08-20 LAB — CBC WITH DIFFERENTIAL/PLATELET
Absolute Monocytes: 322 cells/uL (ref 200–950)
Basophils Absolute: 52 cells/uL (ref 0–200)
Basophils Relative: 1 %
Eosinophils Absolute: 208 cells/uL (ref 15–500)
Eosinophils Relative: 4 %
HCT: 39.3 % (ref 35.0–45.0)
Hemoglobin: 13 g/dL (ref 11.7–15.5)
Lymphs Abs: 1461 cells/uL (ref 850–3900)
MCH: 28.8 pg (ref 27.0–33.0)
MCHC: 33.1 g/dL (ref 32.0–36.0)
MCV: 87.1 fL (ref 80.0–100.0)
MPV: 10.6 fL (ref 7.5–12.5)
Monocytes Relative: 6.2 %
Neutro Abs: 3156 cells/uL (ref 1500–7800)
Neutrophils Relative %: 60.7 %
Platelets: 237 10*3/uL (ref 140–400)
RBC: 4.51 10*6/uL (ref 3.80–5.10)
RDW: 12.8 % (ref 11.0–15.0)
Total Lymphocyte: 28.1 %
WBC: 5.2 10*3/uL (ref 3.8–10.8)

## 2020-08-20 LAB — COMPLETE METABOLIC PANEL WITH GFR
AG Ratio: 1.5 (calc) (ref 1.0–2.5)
ALT: 13 U/L (ref 6–29)
AST: 17 U/L (ref 10–35)
Albumin: 4.4 g/dL (ref 3.6–5.1)
Alkaline phosphatase (APISO): 112 U/L (ref 37–153)
BUN: 13 mg/dL (ref 7–25)
CO2: 28 mmol/L (ref 20–32)
Calcium: 9.5 mg/dL (ref 8.6–10.4)
Chloride: 106 mmol/L (ref 98–110)
Creat: 0.82 mg/dL (ref 0.50–1.05)
GFR, Est African American: 93 mL/min/{1.73_m2} (ref >=60)
GFR, Est Non African American: 81 mL/min/{1.73_m2} (ref >=60)
Globulin: 2.9 g/dL (calc) (ref 1.9–3.7)
Glucose, Bld: 89 mg/dL (ref 65–99)
Potassium: 4.1 mmol/L (ref 3.5–5.3)
Sodium: 142 mmol/L (ref 135–146)
Total Bilirubin: 0.3 mg/dL (ref 0.2–1.2)
Total Protein: 7.3 g/dL (ref 6.1–8.1)

## 2020-08-22 LAB — PAP, TP IMAGING W/ HPV RNA, RFLX HPV TYPE 16,18/45: HPV DNA High Risk: NOT DETECTED

## 2020-09-05 ENCOUNTER — Other Ambulatory Visit: Payer: Self-pay

## 2020-09-05 ENCOUNTER — Ambulatory Visit (INDEPENDENT_AMBULATORY_CARE_PROVIDER_SITE_OTHER): Payer: 59

## 2020-09-05 ENCOUNTER — Ambulatory Visit: Payer: 59 | Admitting: Podiatry

## 2020-09-05 ENCOUNTER — Encounter: Payer: Self-pay | Admitting: Podiatry

## 2020-09-05 DIAGNOSIS — M2011 Hallux valgus (acquired), right foot: Secondary | ICD-10-CM

## 2020-09-05 NOTE — Progress Notes (Signed)
Subjective:  Patient ID: Jennifer Castaneda, female    DOB: 07/03/1965,  MRN: [id number] HPI Chief Complaint  Patient presents with  . Foot Pain    1st MPJ right - bunion deformity x years, aching, worse with shoes   . New Patient (Initial Visit)    55 y.o. female presents with the above complaint.   ROS: Denies fever chills nausea vomiting muscle aches pains calf pain back pain chest pain shortness of breath.  Past Medical History:  Diagnosis Date  . Allergy   . Complication of anesthesia    Pt is Native American  . History of COVID-19 03/17/2019   retested 06/29/2019 positive result  . History of kidney stones   . History of nephrolithiasis   . Hyperparathyroidism ([place])    parathyroidectomy planned for 05/2017 at [facility]  . Hypertension   . Migraine headache   . PONV (postoperative nausea and vomiting)   . Wears glasses    Past Surgical History:  Procedure Laterality Date  . BREAST SURGERY     cyst  . CESAREAN SECTION    . COLONOSCOPY WITH PROPOFOL N/A 11/26/2017   Procedure: COLONOSCOPY WITH PROPOFOL;  Surgeon: Lin Landsman, MD;  Location: [facility] ENDOSCOPY;  Service: Gastroenterology;  Laterality: N/A;  . CYSTOSCOPY/URETEROSCOPY/HOLMIUM LASER/STENT PLACEMENT Right 07/10/2019   Procedure: CYSTOSCOPY/RETROGRADE/URETEROSCOPY/HOLMIUM LASER/ BASKET STONE EXTRACTION/STENT PLACEMENT;  Surgeon: [name], MD;  Location: [facility];  Service: Urology;  Laterality: Right;  . left superior parathyroidectomy     2019 at [facility] Dr. Carmin Muskrat  . NASAL SINUS SURGERY    . PARATHYROIDECTOMY      Current Outpatient Medications:  .  amLODipine (NORVASC) 10 MG tablet, Take 10 mg by mouth daily., Disp: , Rfl:  .  acetaminophen (TYLENOL) 500 MG tablet, Take 500-1,000 mg by mouth every 6 (six) hours as needed for moderate pain or headache. , Disp: , Rfl:  .  APPLE CIDER VINEGAR PO, Take 1 capsule by mouth daily., Disp: , Rfl:  .  Melatonin 3 MG CAPS, Take 3-6  mg by mouth at bedtime as needed (sleep)., Disp: , Rfl:  .  Multiple Vitamin (MULTIVITAMIN) tablet, Take 2 tablets by mouth daily. , Disp: , Rfl:  .  OVER THE COUNTER MEDICATION, Take 1-2 tablets by mouth daily. hydroxycut, Disp: , Rfl:   Allergies  Allergen Reactions  . Aspartame And Phenylalanine Swelling  . Other Nausea And Vomiting    general anesthesia - severe nausea and vomiting   . Phenylalanine Anaphylaxis and Swelling  . Asa [Aspirin] Hives and Nausea And Vomiting  . Penicillins Hives    Did it involve swelling of the face/tongue/throat, SOB, or low BP? No Did it involve sudden or severe rash/hives, skin peeling, or any reaction on the inside of your mouth or nose? No Did you need to seek medical attention at a hospital or doctor's office? No When did it last happen?10+ years If all above answers are "NO", may proceed with cephalosporin use.   Jonah Blue [Metaxalone] Nausea And Vomiting   Review of Systems Objective:  There were no vitals filed for this visit.  General: Well developed, nourished, in no acute distress, alert and oriented x3   Dermatological: Skin is warm, dry and supple bilateral. Nails x 10 are well maintained; remaining integument appears unremarkable at this time. There are no open sores, no preulcerative lesions, no rash or signs of infection present.  Vascular: Dorsalis Pedis artery and Posterior Tibial artery pedal pulses are 2/4  bilateral with immedate capillary fill time. Pedal hair growth present. No varicosities and no lower extremity edema present bilateral.   Neruologic: Grossly intact via light touch bilateral. Vibratory intact via tuning fork bilateral. Protective threshold with Semmes Wienstein monofilament intact to all pedal sites bilateral. Patellar and Achilles deep tendon reflexes 2+ bilateral. No Babinski or clonus noted bilateral.   Musculoskeletal: No gross boney pedal deformities bilateral. No pain, crepitus, or limitation noted  with foot and ankle range of motion bilateral. Muscular strength 5/5 in all groups tested bilateral.  Pain on palpation of the medial dorsal cutaneous nerve overlying the medial aspect of the hypertrophic condyle.  Appears to be reducible and tracking but not yet track bound.  No crepitation noted.  Gait: Unassisted, Nonantalgic.    Radiographs:  Radiographs taken today demonstrate an osseously mature individual with an increase in the first intermetatarsal angle greater than normal value and hallux abductus angle greater than normal value.  Radiographs do not demonstrate any joint space narrowing subchondral sclerosis are otherwise osteoarthritic changes. Assessment & Plan:   Assessment: Hallux abductovalgus deformity with increase angles greater than normal value.  Plan: We consented her today for an [facility] bunion repair right foot.  Answered all the questions regarding this procedure the best my ability layman's terms she understood and was amenable to and signed a 3 page of the consent form.  Did discuss possible postop complications which may include but not limited to postop pain bleeding swelling infection recurrence need for further surgery overcorrection under correction of also digit loss of limb loss of life.  Provided her with information regarding the surgery center anesthesia group and provided her with [facility] for postop recovery.     Hamlin Devine T. [place], [place]

## 2020-09-20 ENCOUNTER — Other Ambulatory Visit: Payer: Self-pay | Admitting: Family Medicine

## 2020-09-20 ENCOUNTER — Other Ambulatory Visit: Payer: Self-pay

## 2020-09-20 MED FILL — Amlodipine Besylate Tab 10 MG (Base Equivalent): ORAL | 90 days supply | Qty: 90 | Fill #0 | Status: AC

## 2020-09-21 ENCOUNTER — Other Ambulatory Visit: Payer: Self-pay

## 2020-09-28 ENCOUNTER — Other Ambulatory Visit: Payer: Self-pay

## 2020-10-19 ENCOUNTER — Encounter: Payer: Self-pay | Admitting: Nurse Practitioner

## 2020-10-19 ENCOUNTER — Other Ambulatory Visit: Payer: Self-pay

## 2020-10-19 ENCOUNTER — Ambulatory Visit: Payer: 59 | Admitting: Nurse Practitioner

## 2020-10-19 VITALS — BP 124/72 | HR 67 | Temp 98.2°F | Ht 70.0 in | Wt 179.8 lb

## 2020-10-19 DIAGNOSIS — R21 Rash and other nonspecific skin eruption: Secondary | ICD-10-CM | POA: Diagnosis not present

## 2020-10-19 MED ORDER — PREDNISONE 10 MG PO TABS
10.0000 mg | ORAL_TABLET | Freq: Every day | ORAL | 0 refills | Status: DC
  Filled 2020-10-19: qty 21, 10d supply, fill #0

## 2020-10-19 MED ORDER — TRIAMCINOLONE ACETONIDE 0.025 % EX OINT
1.0000 "application " | TOPICAL_OINTMENT | Freq: Two times a day (BID) | CUTANEOUS | 0 refills | Status: DC
  Filled 2020-10-19: qty 30, 14d supply, fill #0

## 2020-10-19 NOTE — Progress Notes (Signed)
Subjective:    Patient ID: Jennifer Castaneda, female    DOB: Dec 11, 1965, 55 y.o.   MRN: [id number]  HPI: Jennifer Castaneda is a 55 y.o. female presenting for rash.  Chief Complaint  Patient presents with   Rash    Due to face mask, having redness, and itching on the face. Wear mask on the wkn for wk   RASH Reports rash started about 4 weeks ago.  Works as an Therapist, sports in [employer] - masks have not changed in type or material.  Started having redness under the mask only.  Works weekends - notices when she works, it gets worse.  One weekend she worked 3 shifts and the redness got a lot worse.  Last weekend after work, she noticed the rash traveling under and around her neck.  No shortness of breath until last night, had coughing and had to use inhaler to help control breathing. Duration:  weeks  Location: face  Itching: yes Burning: yes Redness: yes Oozing: no Scaling: no Flaking: yes Blisters: no Painful: no Fevers: no Change in detergents/soaps/personal care products: no Recent illness: no Recent travel:no History of same: no Context: worse Alleviating factors: Oil - coconut oil with tea tree oil and almond oil and this helps.  Hydrocortisone burned. Treatments attempted: hydrocortisone Shortness of breath: no  Throat/tongue swelling: no Myalgias/arthralgias: no   Allergies  Allergen Reactions   Aspartame And Phenylalanine Swelling   Other Nausea And Vomiting    general anesthesia - severe nausea and vomiting    Phenylalanine Anaphylaxis and Swelling   Asa [Aspirin] Hives and Nausea And Vomiting   Penicillins Hives    Did it involve swelling of the face/tongue/throat, SOB, or low BP? No Did it involve sudden or severe rash/hives, skin peeling, or any reaction on the inside of your mouth or nose? No Did you need to seek medical attention at a hospital or doctor's office? No When did it last happen?      10+ years If all above answers are "NO", may  proceed with cephalosporin use.    Skelaxin [Metaxalone] Nausea And Vomiting    Outpatient Encounter Medications as of 10/19/2020  Medication Sig   acetaminophen (TYLENOL) 500 MG tablet Take 500-1,000 mg by mouth every 6 (six) hours as needed for moderate pain or headache.    amLODipine (NORVASC) 10 MG tablet TAKE 1 TABLET (10 MG TOTAL) BY MOUTH DAILY.   APPLE CIDER VINEGAR PO Take 1 capsule by mouth daily.   Melatonin 3 MG CAPS Take 3-6 mg by mouth at bedtime as needed (sleep).   Multiple Vitamin (MULTIVITAMIN) tablet Take 2 tablets by mouth daily.    OVER THE COUNTER MEDICATION Take 1-2 tablets by mouth daily. hydroxycut   predniSONE (DELTASONE) 10 MG tablet Take 40mg  on days 1-2. Take 30mg  on days 3-4. Take 20mg  on days 5-6. Take 10mg  on days 7-8. Take 5mg  on days 9-10, then stop.   triamcinolone (KENALOG) 0.025 % ointment Apply 1 application topically 2 (two) times daily. Apply very sparingly to affected area for up to 14 days.  Not to use more than 14 days to prevent scarring/skin discoloration.   No facility-administered encounter medications on file as of 10/19/2020.    Patient Active Problem List   Diagnosis Date Noted   Kidney stones 12/30/2017   Encounter for screening colonoscopy    Hyperparathyroidism ([place])    Hypertension    Migraines    History of nephrolithiasis  Allergy     Past Medical History:  Diagnosis Date   Allergy    Complication of anesthesia    Pt is Native American   History of COVID-19 03/17/2019   retested 06/29/2019 positive result   History of kidney stones    History of nephrolithiasis    Hyperparathyroidism ([place])    parathyroide[facility] 05/2017 at [facility]   Hypertension    Migraine headache    PONV (postoperative nausea and vomiting)    Wears glasses     Relevant past medical, surgical, family and social history reviewed and updated as indicated. Interim medical history since our last visit reviewed.  Review of Systems Per HPI unless  specifically indicated above     Objective:    BP 124/72   Pulse 67   Temp 98.2 F (36.8 C)   Ht 5\' 10"  (1.778 m)   Wt 179 lb 12.8 oz (81.6 kg)   LMP 04/16/2012   SpO2 99%   BMI 25.80 kg/m   Wt Readings from Last 3 Encounters:  10/19/20 179 lb 12.8 oz (81.6 kg)  08/19/20 181 lb (82.1 kg)  07/23/19 178 lb (80.7 kg)    Physical Exam Vitals and nursing note reviewed.  Constitutional:      General: She is not in acute distress.    Appearance: Normal appearance. She is not toxic-appearing.  HENT:     Nose: Nose normal. No congestion.     Mouth/Throat:     Mouth: Mucous membranes are moist.     Pharynx: Oropharynx is clear. No oropharyngeal exudate or posterior oropharyngeal erythema.  Cardiovascular:     Rate and Rhythm: Normal rate.     Heart sounds: Normal heart sounds.  Pulmonary:     Effort: Pulmonary effort is normal. No respiratory distress.     Breath sounds: Normal breath sounds. No wheezing, rhonchi or rales.  Skin:    Capillary Refill: Capillary refill takes less than 2 seconds.     Findings: Erythema and rash present.     Comments: Rash noted to bilateral cheeks, traveling down neck.  Rash is erythematous, rough, raised.  Neurological:     Mental Status: She is alert and oriented to person, place, and time.     Motor: No weakness.     Gait: Gait normal.  Psychiatric:        Mood and Affect: Mood normal.        Behavior: Behavior normal.        Thought Content: Thought content normal.        Judgment: Judgment normal.       Assessment & Plan:  1. Rash Acute.  Differentials include contact dermatitis versus allergic reaction to material mask is made out of.  Patient has never had trouble with this particular mask in the past, however.  Encouraged barrier in between face and surgical mask like cloth mask, as surgical mask is required for work.  If unable to wear cloth mask under surgical, will need to stay out of work until rash resolves.  Will start on  prednisone taper and triamcinolone ointment.  Discussed that steroid ointments applied to face can cause long-term discoloration/scarring if used more than 2 weeks-use sparingly and stop after 14 days if no benefit.  Encouraged cleansing face twice daily and applying thin layer of emollient like Aquaphor or Vaseline.  Follow-up with improvement.  Can also consider referral to dermatology with treatment.  - predniSONE (DELTASONE) 10 MG tablet; Take 40mg  on days 1-2.  Take 30mg  on days 3-4. Take 20mg  on days 5-6. Take 10mg  on days 7-8. Take 5mg  on days 9-10, then stop.  Dispense: 21 tablet; Refill: 0 - triamcinolone (KENALOG) 0.025 % ointment; Apply 1 application topically 2 (two) times daily. Apply very sparingly to affected area for up to 14 days.  Not to use more than 14 days to prevent scarring/skin discoloration.  Dispense: 30 g; Refill: 0  Follow up plan: Return if symptoms worsen or fail to improve.

## 2020-10-20 ENCOUNTER — Other Ambulatory Visit: Payer: Self-pay

## 2020-10-31 ENCOUNTER — Ambulatory Visit: Payer: 59

## 2020-10-31 ENCOUNTER — Ambulatory Visit
Admission: RE | Admit: 2020-10-31 | Discharge: 2020-10-31 | Disposition: A | Discharge status: Home / Self Care | Payer: 59 | Source: Ambulatory Visit | Attending: Family Medicine | Admitting: Family Medicine

## 2020-10-31 ENCOUNTER — Other Ambulatory Visit: Payer: Self-pay

## 2020-10-31 DIAGNOSIS — Z1231 Encounter for screening mammogram for malignant neoplasm of breast: Secondary | ICD-10-CM

## 2021-01-04 MED FILL — Amlodipine Besylate Tab 10 MG (Base Equivalent): ORAL | 90 days supply | Qty: 90 | Fill #1 | Status: CN

## 2021-01-05 ENCOUNTER — Other Ambulatory Visit: Payer: Self-pay

## 2021-01-05 MED FILL — Amlodipine Besylate Tab 10 MG (Base Equivalent): ORAL | 90 days supply | Qty: 90 | Fill #1 | Status: AC

## 2021-04-19 ENCOUNTER — Other Ambulatory Visit: Payer: Self-pay

## 2021-04-19 MED FILL — Amlodipine Besylate Tab 10 MG (Base Equivalent): ORAL | 90 days supply | Qty: 90 | Fill #2 | Status: AC

## 2021-04-21 ENCOUNTER — Other Ambulatory Visit: Payer: Self-pay

## 2021-06-27 DIAGNOSIS — H52223 Regular astigmatism, bilateral: Secondary | ICD-10-CM | POA: Diagnosis not present

## 2021-06-27 DIAGNOSIS — H5203 Hypermetropia, bilateral: Secondary | ICD-10-CM | POA: Diagnosis not present

## 2021-06-27 DIAGNOSIS — H524 Presbyopia: Secondary | ICD-10-CM | POA: Diagnosis not present

## 2021-08-02 ENCOUNTER — Other Ambulatory Visit: Payer: Self-pay

## 2021-08-02 MED FILL — Amlodipine Besylate Tab 10 MG (Base Equivalent): ORAL | 90 days supply | Qty: 90 | Fill #3 | Status: AC

## 2021-08-03 IMAGING — CT CT RENAL STONE PROTOCOL
2 of 4 series · 17 of 46 positions shown, 19 images · non-contrast
Comparison: 12/16/2008

CLINICAL DATA: Right-sided flank pain.

EXAM:
CT ABDOMEN AND PELVIS WITHOUT CONTRAST
TECHNIQUE: Multidetector CT imaging of the abdomen and pelvis was performed
following the standard protocol without IV contrast.

[Series 2: stone full standard · axial · 0.75mm/px · z∈[-1034,-624]mm · 14 of 90 slices shown, 16 images]
[im 4/90  soft-tissue]
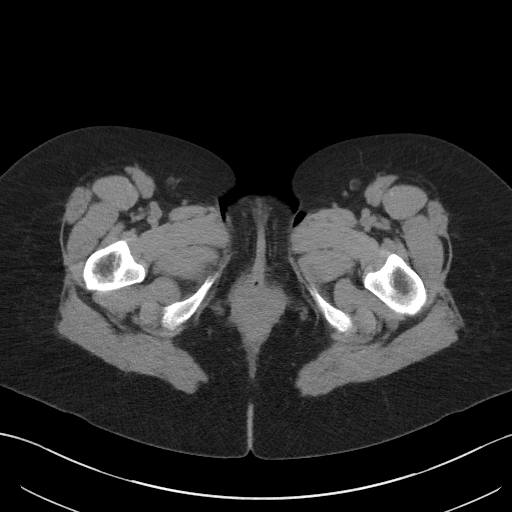
[im 4/90  bone]
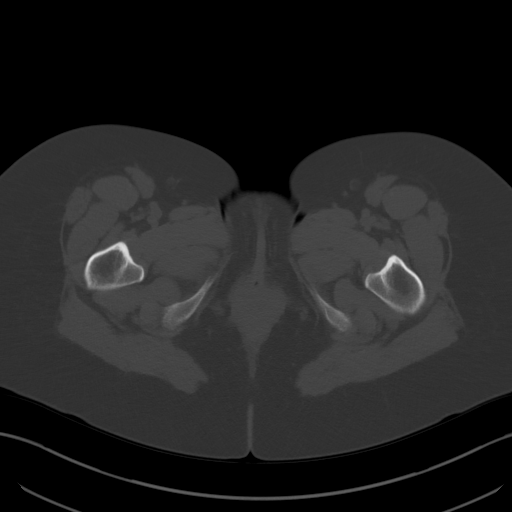
[im 11/90  soft-tissue]
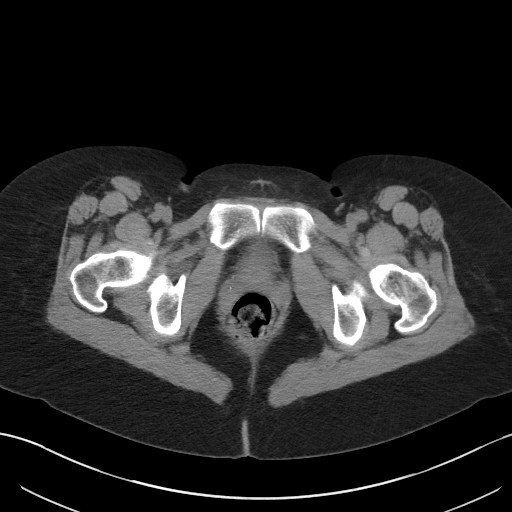
[im 18/90  soft-tissue]
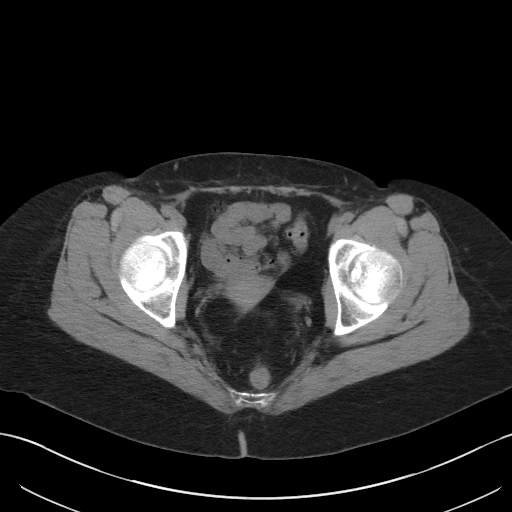
[im 24/90  soft-tissue]
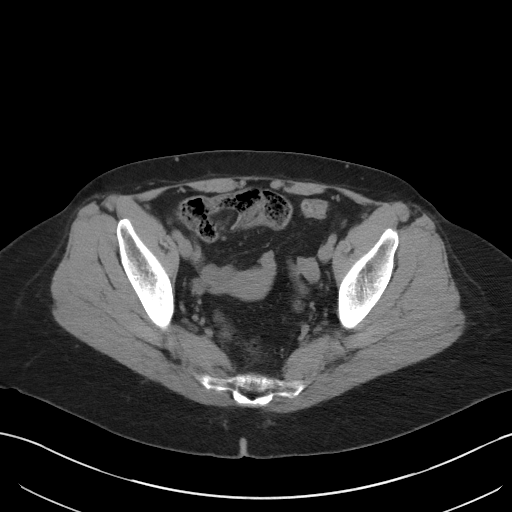
[im 31/90  soft-tissue]
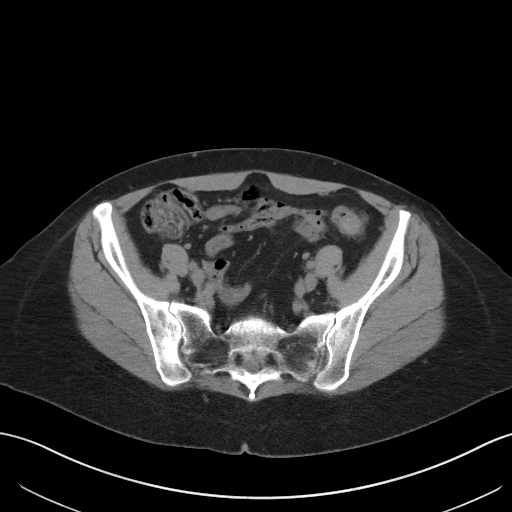
[im 35/90  soft-tissue]
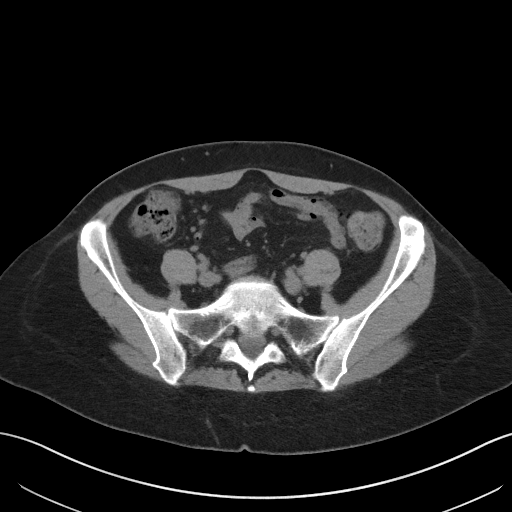
[im 42/90  soft-tissue]
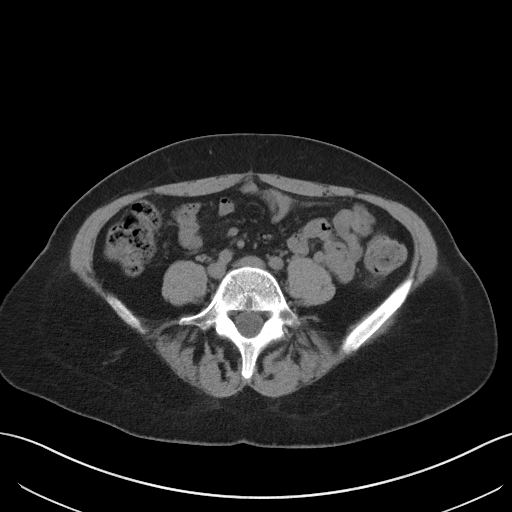
[im 48/90  soft-tissue]
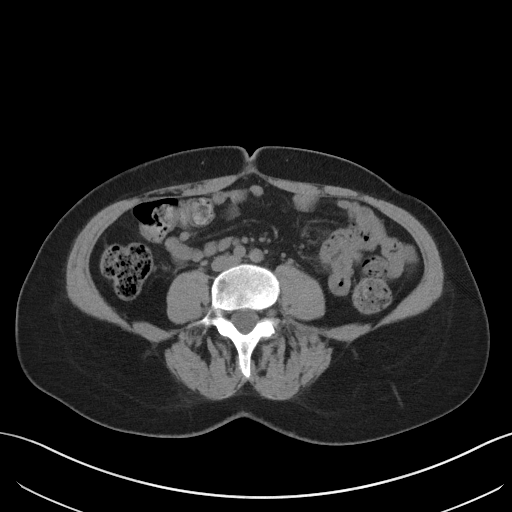
[im 55/90  soft-tissue]
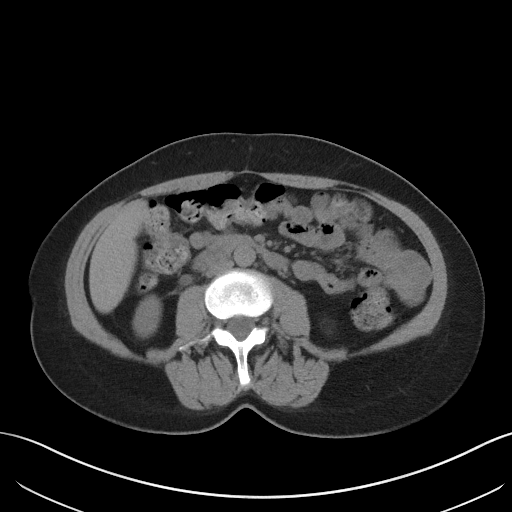
[im 55/90  bone]
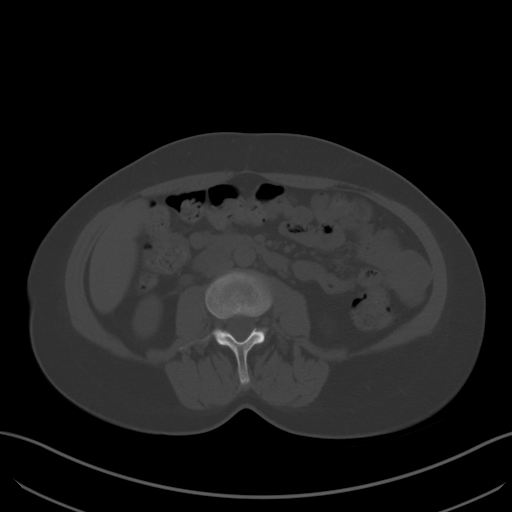
[im 59/90  soft-tissue]
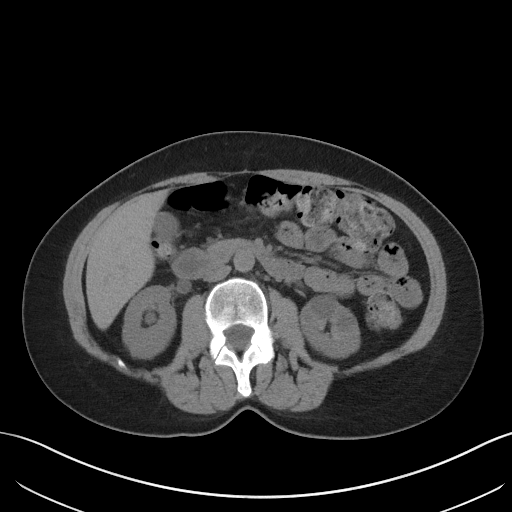
[im 66/90  soft-tissue]
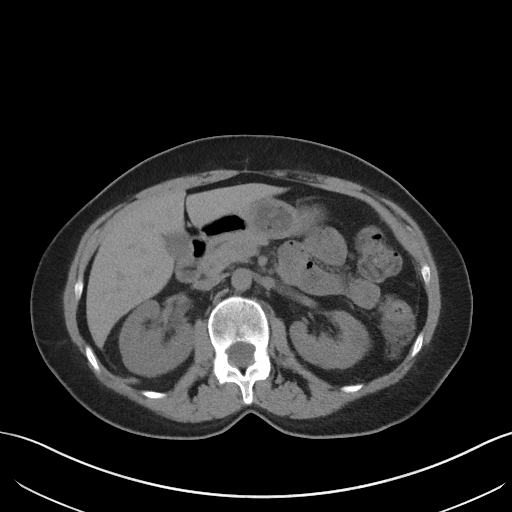
[im 72/90  soft-tissue]
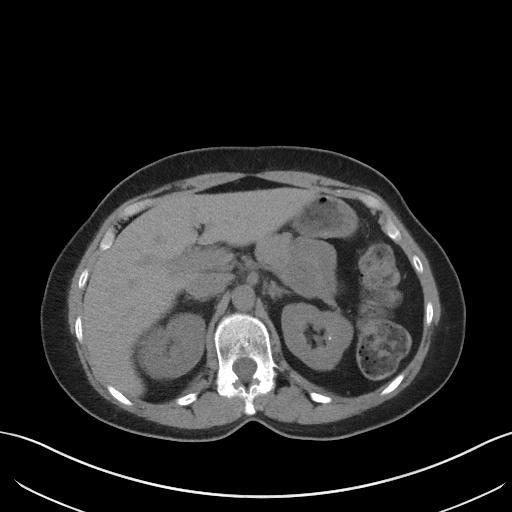
[im 79/90  soft-tissue]
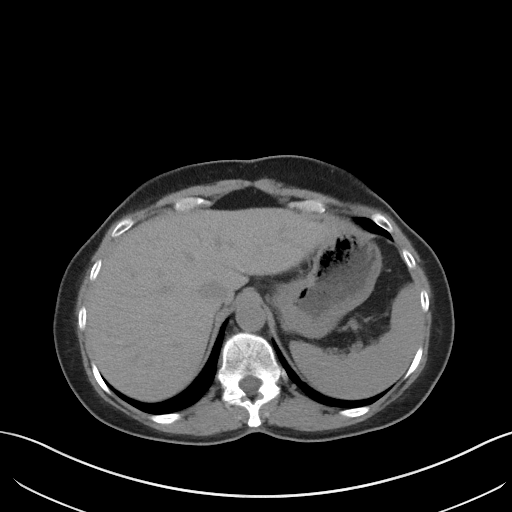
[im 86/90  soft-tissue]
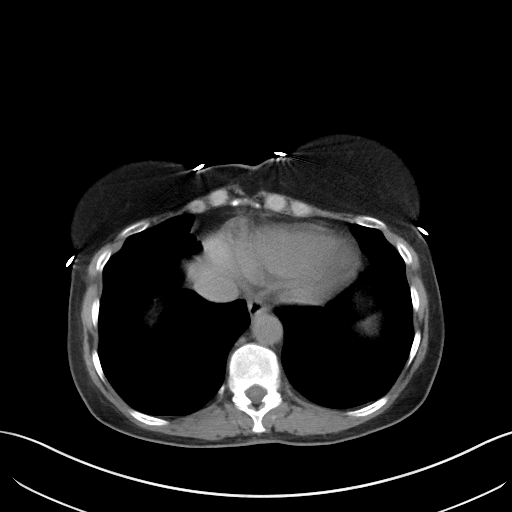

[Series 5: coronal · coronal · 0.72mm/px · 3 of 111 slices shown]
[im 37/111  soft-tissue]
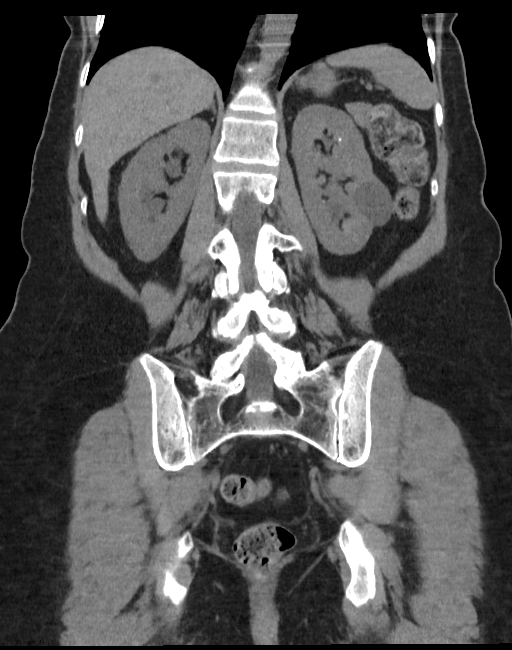
[im 49/111  soft-tissue]
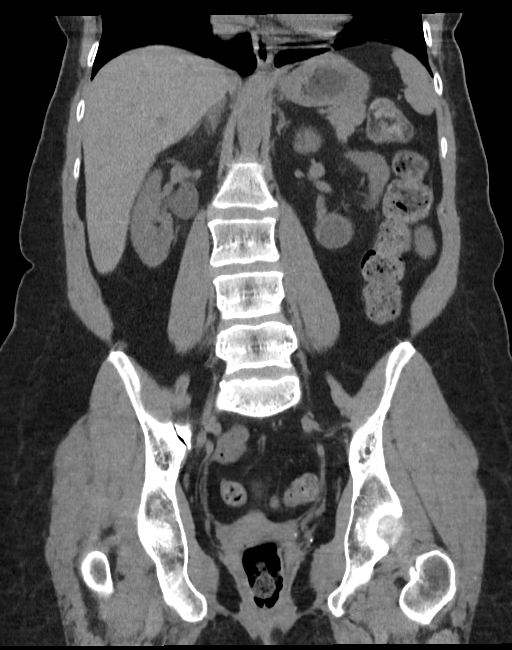
[im 62/111  soft-tissue]
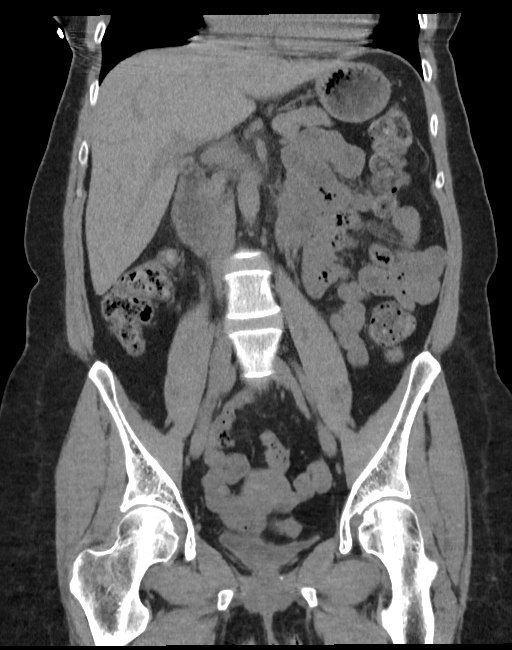

[17 of 46 positions shown; findings below may reference images not displayed]

FINDINGS: Lower chest: Normal

Hepatobiliary: Normal

Pancreas: Normal

Spleen: Normal

Adrenals/Urinary Tract: Adrenal glands are normal. Left kidney
contains a nonobstructing 2 mm stone in the upper pole. Renal cysts
are present in lower pole. Right kidney shows moderate
hydroureteronephrosis with a 5 mm stone in the proximal ureter. No
stone distal to that. No stone in the bladder.

Stomach/Bowel: Normal

Vascular/Lymphatic: Normal

Reproductive: Normal

Other: No free fluid or air.

Musculoskeletal: Normal
IMPRESSION: 5 mm stone in the proximal right ureter at the level of the lower
pole of the right kidney. Moderate right hydronephrosis.

2 mm nonobstructing stone in the lower pole of the left kidney.

## 2021-08-24 ENCOUNTER — Other Ambulatory Visit: Payer: Self-pay

## 2021-08-24 MED ORDER — COVID-19 AT HOME ANTIGEN TEST VI KIT
PACK | 0 refills | Status: DC
  Filled 2021-08-24: qty 2, 4d supply, fill #0

## 2021-10-11 DIAGNOSIS — M21611 Bunion of right foot: Secondary | ICD-10-CM | POA: Diagnosis not present

## 2021-10-11 DIAGNOSIS — M79671 Pain in right foot: Secondary | ICD-10-CM | POA: Diagnosis not present

## 2021-10-11 DIAGNOSIS — M2021 Hallux rigidus, right foot: Secondary | ICD-10-CM | POA: Diagnosis not present

## 2021-10-22 DIAGNOSIS — M79671 Pain in right foot: Secondary | ICD-10-CM | POA: Diagnosis not present

## 2021-10-30 DIAGNOSIS — M2021 Hallux rigidus, right foot: Secondary | ICD-10-CM | POA: Diagnosis not present

## 2021-10-30 DIAGNOSIS — M21611 Bunion of right foot: Secondary | ICD-10-CM | POA: Diagnosis not present

## 2021-11-01 ENCOUNTER — Other Ambulatory Visit (HOSPITAL_COMMUNITY): Payer: Self-pay | Admitting: Orthopedic Surgery

## 2021-11-07 ENCOUNTER — Other Ambulatory Visit: Payer: Self-pay | Admitting: Family Medicine

## 2021-11-08 ENCOUNTER — Other Ambulatory Visit: Payer: Self-pay

## 2021-11-08 MED ORDER — AMLODIPINE BESYLATE 10 MG PO TABS
ORAL_TABLET | ORAL | 0 refills | Status: DC
  Filled 2021-11-08: qty 30, 30d supply, fill #0

## 2021-11-08 NOTE — Telephone Encounter (Signed)
PLS call pt for CPE appt have not been seen since 5/22 for future med refills

## 2021-11-24 ENCOUNTER — Other Ambulatory Visit: Payer: Self-pay

## 2021-11-24 ENCOUNTER — Encounter (HOSPITAL_BASED_OUTPATIENT_CLINIC_OR_DEPARTMENT_OTHER): Payer: Self-pay | Admitting: Orthopedic Surgery

## 2021-11-29 NOTE — Anesthesia Preprocedure Evaluation (Signed)
Anesthesia Evaluation  Patient identified by MRN, date of birth, ID band Patient awake    Reviewed: Allergy & Precautions, NPO status , Patient's Chart, lab work & pertinent test results  History of Anesthesia Complications (+) PONV and history of anesthetic complications  Airway Mallampati: III  TM Distance: >3 FB Neck ROM: Full  Mouth opening: Limited Mouth Opening  Dental no notable dental hx. (+) Teeth Intact, Dental Advisory Given   Pulmonary neg pulmonary ROS, former smoker,    Pulmonary exam normal breath sounds clear to auscultation       Cardiovascular hypertension, Normal cardiovascular exam Rhythm:Regular Rate:Normal     Neuro/Psych  Headaches, negative psych ROS   GI/Hepatic negative GI ROS, Neg liver ROS,   Endo/Other  negative endocrine ROS  Renal/GU negative Renal ROS  negative genitourinary   Musculoskeletal negative musculoskeletal ROS (+)   Abdominal   Peds  Hematology negative hematology ROS (+)   Anesthesia Other Findings   Reproductive/Obstetrics                           Anesthesia Physical Anesthesia Plan  ASA: 2  Anesthesia Plan: General and Regional   Post-op Pain Management: Regional block* and Ofirmev IV (intra-op)*   Induction: Intravenous  PONV Risk Score and Plan: 4 or greater and Midazolam, Dexamethasone, Ondansetron and TIVA  Airway Management Planned: Oral ETT and Video Laryngoscope Planned  Additional Equipment:   Intra-op Plan:   Post-operative Plan: Extubation in OR  Informed Consent: I have reviewed the patients History and Physical, chart, labs and discussed the procedure including the risks, benefits and alternatives for the proposed anesthesia with the patient or authorized representative who has indicated his/her understanding and acceptance.     Dental advisory given  Plan Discussed with: CRNA  Anesthesia Plan Comments: (Plan for  ETT with glidescope 2/2 history difficulty LMA/ETT placement)       Anesthesia Quick Evaluation

## 2021-11-30 ENCOUNTER — Ambulatory Visit (HOSPITAL_BASED_OUTPATIENT_CLINIC_OR_DEPARTMENT_OTHER): Payer: 59 | Admitting: Anesthesiology

## 2021-11-30 ENCOUNTER — Other Ambulatory Visit: Payer: Self-pay

## 2021-11-30 ENCOUNTER — Encounter (HOSPITAL_BASED_OUTPATIENT_CLINIC_OR_DEPARTMENT_OTHER)
Admission: RE | Disposition: A | Discharge status: Home / Self Care | Payer: Self-pay | Source: Ambulatory Visit | Attending: Orthopedic Surgery

## 2021-11-30 ENCOUNTER — Encounter (HOSPITAL_BASED_OUTPATIENT_CLINIC_OR_DEPARTMENT_OTHER): Payer: Self-pay | Admitting: Orthopedic Surgery

## 2021-11-30 ENCOUNTER — Ambulatory Visit (HOSPITAL_BASED_OUTPATIENT_CLINIC_OR_DEPARTMENT_OTHER): Payer: 59

## 2021-11-30 ENCOUNTER — Ambulatory Visit (HOSPITAL_BASED_OUTPATIENT_CLINIC_OR_DEPARTMENT_OTHER)
Admission: RE | Admit: 2021-11-30 | Discharge: 2021-11-30 | Disposition: A | Discharge status: Home / Self Care | Payer: 59 | Source: Ambulatory Visit | Attending: Orthopedic Surgery | Admitting: Orthopedic Surgery

## 2021-11-30 DIAGNOSIS — M21611 Bunion of right foot: Secondary | ICD-10-CM | POA: Diagnosis not present

## 2021-11-30 DIAGNOSIS — G8918 Other acute postprocedural pain: Secondary | ICD-10-CM | POA: Diagnosis not present

## 2021-11-30 DIAGNOSIS — Z87891 Personal history of nicotine dependence: Secondary | ICD-10-CM | POA: Diagnosis not present

## 2021-11-30 DIAGNOSIS — I10 Essential (primary) hypertension: Secondary | ICD-10-CM

## 2021-11-30 DIAGNOSIS — Z79899 Other long term (current) drug therapy: Secondary | ICD-10-CM | POA: Diagnosis not present

## 2021-11-30 HISTORY — PX: BUNIONECTOMY: SHX129

## 2021-11-30 HISTORY — PX: AIKEN OSTEOTOMY: SHX6331

## 2021-11-30 SURGERY — BUNIONECTOMY
Anesthesia: Regional | Site: Toe | Laterality: Right

## 2021-11-30 MED ORDER — PROPOFOL 500 MG/50ML IV EMUL
INTRAVENOUS | Status: DC | PRN
  Administered 2021-11-30: 150 ug/kg/min via INTRAVENOUS

## 2021-11-30 MED ORDER — LIDOCAINE 2% (20 MG/ML) 5 ML SYRINGE
INTRAMUSCULAR | Status: DC | PRN
  Administered 2021-11-30: 20 mg via INTRAVENOUS

## 2021-11-30 MED ORDER — VANCOMYCIN HCL 500 MG IV SOLR
INTRAVENOUS | Status: AC
  Filled 2021-11-30: qty 10

## 2021-11-30 MED ORDER — ONDANSETRON HCL 4 MG/2ML IJ SOLN
INTRAMUSCULAR | Status: AC
  Filled 2021-11-30: qty 20

## 2021-11-30 MED ORDER — ACETAMINOPHEN 10 MG/ML IV SOLN
INTRAVENOUS | Status: DC | PRN
  Administered 2021-11-30: 1000 mg via INTRAVENOUS

## 2021-11-30 MED ORDER — LACTATED RINGERS IV SOLN: INTRAVENOUS | Status: DC

## 2021-11-30 MED ORDER — DEXMEDETOMIDINE HCL IN NACL 80 MCG/20ML IV SOLN
INTRAVENOUS | Status: AC
  Filled 2021-11-30: qty 20

## 2021-11-30 MED ORDER — FENTANYL CITRATE (PF) 100 MCG/2ML IJ SOLN
INTRAMUSCULAR | Status: AC
  Filled 2021-11-30: qty 2

## 2021-11-30 MED ORDER — BUPIVACAINE HCL (PF) 0.5 % IJ SOLN
INTRAMUSCULAR | Status: DC | PRN
  Administered 2021-11-30: 30 mL via PERINEURAL

## 2021-11-30 MED ORDER — MIDAZOLAM HCL 2 MG/2ML IJ SOLN
INTRAMUSCULAR | Status: AC
  Filled 2021-11-30: qty 2

## 2021-11-30 MED ORDER — PROPOFOL 10 MG/ML IV BOLUS
INTRAVENOUS | Status: DC | PRN
  Administered 2021-11-30: 200 mg via INTRAVENOUS

## 2021-11-30 MED ORDER — FENTANYL CITRATE (PF) 100 MCG/2ML IJ SOLN
100.0000 ug | Freq: Once | INTRAMUSCULAR | Status: AC
  Administered 2021-11-30: 100 ug via INTRAVENOUS

## 2021-11-30 MED ORDER — SENNA 8.6 MG PO TABS
2.0000 | ORAL_TABLET | Freq: Two times a day (BID) | ORAL | 0 refills | Status: DC
  Filled 2021-11-30: qty 30, 8d supply, fill #0

## 2021-11-30 MED ORDER — VANCOMYCIN HCL 500 MG IV SOLR: INTRAVENOUS | Status: DC | PRN

## 2021-11-30 MED ORDER — CEFAZOLIN SODIUM-DEXTROSE 2-4 GM/100ML-% IV SOLN
INTRAVENOUS | Status: AC
  Filled 2021-11-30: qty 100

## 2021-11-30 MED ORDER — MIDAZOLAM HCL 2 MG/2ML IJ SOLN
2.0000 mg | Freq: Once | INTRAMUSCULAR | Status: AC
  Administered 2021-11-30: 2 mg via INTRAVENOUS

## 2021-11-30 MED ORDER — PROPOFOL 500 MG/50ML IV EMUL
INTRAVENOUS | Status: AC
  Filled 2021-11-30: qty 50

## 2021-11-30 MED ORDER — BUPIVACAINE-EPINEPHRINE (PF) 0.5% -1:200000 IJ SOLN
INTRAMUSCULAR | Status: AC
  Filled 2021-11-30: qty 30

## 2021-11-30 MED ORDER — DEXAMETHASONE SODIUM PHOSPHATE 10 MG/ML IJ SOLN
INTRAMUSCULAR | Status: DC | PRN
  Administered 2021-11-30: 10 mg via INTRAVENOUS

## 2021-11-30 MED ORDER — DEXAMETHASONE SODIUM PHOSPHATE 10 MG/ML IJ SOLN
INTRAMUSCULAR | Status: DC | PRN
  Administered 2021-11-30: 5 mg

## 2021-11-30 MED ORDER — SODIUM CHLORIDE 0.9 % IV SOLN: INTRAVENOUS | Status: DC

## 2021-11-30 MED ORDER — FENTANYL CITRATE (PF) 100 MCG/2ML IJ SOLN
INTRAMUSCULAR | Status: DC | PRN
  Administered 2021-11-30: 50 ug via INTRAVENOUS

## 2021-11-30 MED ORDER — SUGAMMADEX SODIUM 200 MG/2ML IV SOLN
INTRAVENOUS | Status: DC | PRN
  Administered 2021-11-30: 200 mg via INTRAVENOUS

## 2021-11-30 MED ORDER — CEFAZOLIN SODIUM-DEXTROSE 2-4 GM/100ML-% IV SOLN
2.0000 g | INTRAVENOUS | Status: AC
  Administered 2021-11-30: 2 g via INTRAVENOUS

## 2021-11-30 MED ORDER — ONDANSETRON HCL 4 MG/2ML IJ SOLN
INTRAMUSCULAR | Status: DC | PRN
  Administered 2021-11-30: 4 mg via INTRAVENOUS

## 2021-11-30 MED ORDER — ROCURONIUM BROMIDE 100 MG/10ML IV SOLN
INTRAVENOUS | Status: DC | PRN
  Administered 2021-11-30: 60 mg via INTRAVENOUS

## 2021-11-30 MED ORDER — ROPIVACAINE HCL 5 MG/ML IJ SOLN
INTRAMUSCULAR | Status: DC | PRN
  Administered 2021-11-30: 10 mL via EPIDURAL

## 2021-11-30 MED ORDER — DOCUSATE SODIUM 100 MG PO CAPS
100.0000 mg | ORAL_CAPSULE | Freq: Two times a day (BID) | ORAL | 0 refills | Status: DC
  Filled 2021-11-30: qty 30, 15d supply, fill #0

## 2021-11-30 MED ORDER — OXYCODONE HCL 5 MG PO TABS
5.0000 mg | ORAL_TABLET | ORAL | 0 refills | Status: AC | PRN
  Filled 2021-11-30: qty 18, 3d supply, fill #0

## 2021-11-30 MED ORDER — DEXAMETHASONE SODIUM PHOSPHATE 10 MG/ML IJ SOLN
INTRAMUSCULAR | Status: AC
  Filled 2021-11-30: qty 2

## 2021-11-30 MED ORDER — LIDOCAINE 2% (20 MG/ML) 5 ML SYRINGE
INTRAMUSCULAR | Status: AC
  Filled 2021-11-30: qty 25

## 2021-11-30 MED ORDER — ROCURONIUM BROMIDE 10 MG/ML (PF) SYRINGE
PREFILLED_SYRINGE | INTRAVENOUS | Status: AC
Start: 2021-11-30 — End: ?
  Filled 2021-11-30: qty 10

## 2021-11-30 MED ORDER — PHENYLEPHRINE 80 MCG/ML (10ML) SYRINGE FOR IV PUSH (FOR BLOOD PRESSURE SUPPORT)
PREFILLED_SYRINGE | INTRAVENOUS | Status: AC
  Filled 2021-11-30: qty 10

## 2021-11-30 MED ORDER — 0.9 % SODIUM CHLORIDE (POUR BTL) OPTIME
TOPICAL | Status: DC | PRN
  Administered 2021-11-30: 200 mL

## 2021-11-30 MED ORDER — BUPIVACAINE LIPOSOME 1.3 % IJ SUSP
INTRAMUSCULAR | Status: DC | PRN
  Administered 2021-11-30: 10 mL via PERINEURAL

## 2021-11-30 SURGICAL SUPPLY — 97 items
APL PRP STRL LF DISP 70% ISPRP (MISCELLANEOUS) ×1
BIT DRILL CANNULAT MICA 2.2X60 (BIT) IMPLANT
BIT DRILL MICA CANN SS 3X60 (BIT) IMPLANT
BIT DRILL PROSTEP MICA 2.2 (BIT) IMPLANT
BLADE AVERAGE 25X9 (BLADE) IMPLANT
BLADE LONG MED 25X9 (BLADE) IMPLANT
BLADE MICRO SAGITTAL (BLADE) IMPLANT
BLADE MINI RND TIP GREEN BEAV (BLADE) IMPLANT
BLADE OSC/SAG .038X5.5 CUT EDG (BLADE) IMPLANT
BLADE SURG 15 STRL LF DISP TIS (BLADE) ×3 IMPLANT
BLADE SURG 15 STRL SS (BLADE) ×3
BNDG CMPR 75X21 PLY HI ABS (MISCELLANEOUS)
BNDG ELASTIC 4X5.8 VLCR STR LF (GAUZE/BANDAGES/DRESSINGS) ×1 IMPLANT
BNDG ELASTIC 6X5.8 VLCR STR LF (GAUZE/BANDAGES/DRESSINGS) IMPLANT
BNDG GZE 12X3 1 PLY HI ABS (GAUZE/BANDAGES/DRESSINGS) ×1
BNDG STRETCH GAUZE 3IN X12FT (GAUZE/BANDAGES/DRESSINGS) ×1 IMPLANT
BUR MICA 2X12 (BURR) IMPLANT
BUR MICA 2X20 (BURR) IMPLANT
BUR MIS CONICAL WEDGE 4.3X13 (BUR) IMPLANT
BUR WEDGE MICA 3.1X13 (BURR) IMPLANT
BURR CONICAL 4.3 (BUR)
BURR MICA 2X12 (BURR) ×1
BURR MICA 2X20 (BURR) ×1
BURR MIS CONICAL WEDGE 4.3X13 (BUR)
BURR WEDGE MICA 3.1X13 (BURR) ×1
CHLORAPREP W/TINT 26 (MISCELLANEOUS) ×1 IMPLANT
COVER BACK TABLE 60X90IN (DRAPES) ×1 IMPLANT
CUFF TOURN SGL QUICK 24 (TOURNIQUET CUFF)
CUFF TOURN SGL QUICK 34 (TOURNIQUET CUFF)
CUFF TRNQT CYL 24X4X16.5-23 (TOURNIQUET CUFF) IMPLANT
CUFF TRNQT CYL 34X4.125X (TOURNIQUET CUFF) IMPLANT
DRAPE EXTREMITY T 121X128X90 (DISPOSABLE) ×1 IMPLANT
DRAPE OEC MINIVIEW 54X84 (DRAPES) ×1 IMPLANT
DRAPE U-SHAPE 47X51 STRL (DRAPES) ×1 IMPLANT
DRIVER MICA HEX 2.0 (MISCELLANEOUS) IMPLANT
DRIVER MICA HEX 2.5 (MISCELLANEOUS) IMPLANT
DRSG MEPITEL 4X7.2 (GAUZE/BANDAGES/DRESSINGS) ×1 IMPLANT
DRSG PAD ABDOMINAL 8X10 ST (GAUZE/BANDAGES/DRESSINGS) ×1 IMPLANT
ELECT REM PT RETURN 9FT ADLT (ELECTROSURGICAL) ×1
ELECTRODE REM PT RTRN 9FT ADLT (ELECTROSURGICAL) ×1 IMPLANT
GAUGE DEPTH MICA PROSTEP 150 (MISCELLANEOUS) IMPLANT
GAUZE SPONGE 4X4 12PLY STRL (GAUZE/BANDAGES/DRESSINGS) ×1 IMPLANT
GAUZE STRETCH 2X75IN STRL (MISCELLANEOUS) IMPLANT
GLOVE BIO SURGEON STRL SZ8 (GLOVE) ×1 IMPLANT
GLOVE BIOGEL PI IND STRL 8 (GLOVE) ×2 IMPLANT
GLOVE BIOGEL PI INDICATOR 8 (GLOVE) ×2
GLOVE ECLIPSE 8.0 STRL XLNG CF (GLOVE) ×1 IMPLANT
GOWN STRL REUS W/ TWL LRG LVL3 (GOWN DISPOSABLE) ×1 IMPLANT
GOWN STRL REUS W/ TWL XL LVL3 (GOWN DISPOSABLE) ×2 IMPLANT
GOWN STRL REUS W/TWL LRG LVL3 (GOWN DISPOSABLE) ×1
GOWN STRL REUS W/TWL XL LVL3 (GOWN DISPOSABLE) ×2
HANDLE AO CANNULATED DRIVER (MISCELLANEOUS) IMPLANT
K-WIRE BLUNT TROCAR 1.4X150 (WIRE) ×1
K-WIRE BLUNT/TROCAR 0.9X150 (WIRE) ×1
K-WIRE DBL .054X9 NSTRL (WIRE)
KWIRE BLUNT TROCAR 1.4X150 (WIRE) IMPLANT
KWIRE BLUNT/TROCAR 0.9X150 (WIRE) IMPLANT
KWIRE DBL .054X9 NSTRL (WIRE) ×1 IMPLANT
NDL HYPO 25X1 1.5 SAFETY (NEEDLE) IMPLANT
NEEDLE HYPO 22GX1.5 SAFETY (NEEDLE) IMPLANT
NEEDLE HYPO 25X1 1.5 SAFETY (NEEDLE) IMPLANT
NS IRRIG 1000ML POUR BTL (IV SOLUTION) ×1 IMPLANT
PACK BASIN DAY SURGERY FS (CUSTOM PROCEDURE TRAY) ×1 IMPLANT
PACK INSTRUMENT PROSTEP DISP (INSTRUMENTS) IMPLANT
PAD CAST 4YDX4 CTTN HI CHSV (CAST SUPPLIES) ×1 IMPLANT
PADDING CAST ABS 4INX4YD NS (CAST SUPPLIES)
PADDING CAST ABS COTTON 4X4 ST (CAST SUPPLIES) IMPLANT
PADDING CAST COTTON 4X4 STRL (CAST SUPPLIES) ×1
PADDING CAST COTTON 6X4 STRL (CAST SUPPLIES) IMPLANT
PENCIL SMOKE EVACUATOR (MISCELLANEOUS) ×1 IMPLANT
SANITIZER HAND PURELL 535ML FO (MISCELLANEOUS) ×1 IMPLANT
SCREW MICA PROSTEP 3.0X26 (Screw) IMPLANT
SCREW MICA PROSTEP 3.0X44 (Screw) IMPLANT
SCREW PROSTEP MICA 4X60 (Screw) IMPLANT
SHEET MEDIUM DRAPE 40X70 STRL (DRAPES) ×1 IMPLANT
SLEEVE SCD COMPRESS KNEE MED (STOCKING) ×1 IMPLANT
SPLINT FAST PLASTER 5X30 (CAST SUPPLIES)
SPLINT PLASTER CAST FAST 5X30 (CAST SUPPLIES) IMPLANT
SPONGE T-LAP 18X18 ~~LOC~~+RFID (SPONGE) ×1 IMPLANT
STOCKINETTE 6  STRL (DRAPES) ×1
STOCKINETTE 6 STRL (DRAPES) ×1 IMPLANT
SUCTION FRAZIER HANDLE 10FR (MISCELLANEOUS) ×1
SUCTION TUBE FRAZIER 10FR DISP (MISCELLANEOUS) ×1 IMPLANT
SUT ETHILON 3 0 PS 1 (SUTURE) ×1 IMPLANT
SUT MNCRL AB 3-0 PS2 18 (SUTURE) ×1 IMPLANT
SUT VIC AB 2-0 SH 27 (SUTURE)
SUT VIC AB 2-0 SH 27XBRD (SUTURE) IMPLANT
SUT VICRYL 0 SH 27 (SUTURE) IMPLANT
SUT VICRYL 0 UR6 27IN ABS (SUTURE) IMPLANT
SYR BULB EAR ULCER 3OZ GRN STR (SYRINGE) ×1 IMPLANT
SYR CONTROL 10ML LL (SYRINGE) IMPLANT
TOWEL GREEN STERILE FF (TOWEL DISPOSABLE) ×2 IMPLANT
TRANSLATOR PRSTEP MICA 1ST MET (MISCELLANEOUS) IMPLANT
TUBE CONNECTING 20X1/4 (TUBING) IMPLANT
TUBE SURG IRRIGATION (TUBING) IMPLANT
UNDERPAD 30X36 HEAVY ABSORB (UNDERPADS AND DIAPERS) ×1 IMPLANT
YANKAUER SUCT BULB TIP NO VENT (SUCTIONS) IMPLANT

## 2021-11-30 NOTE — Discharge Instructions (Addendum)
Jennifer Simmer, MD [facility]  Please read the following information regarding your care after surgery.  Medications  You only need a prescription for the narcotic pain medicine (ex. oxycodone, Percocet, Norco).  All of the other medicines listed below are available over the counter. ? Aleve 2 pills twice a day for the first 3 days after surgery. ? acetominophen (Tylenol) 650 mg every 4-6 hours as you need for minor to moderate pain ? oxycodone as prescribed for severe pain  Narcotic pain medicine (ex. oxycodone, Percocet, Vicodin) will cause constipation.  To prevent this problem, take the following medicines while you are taking any pain medicine. ? docusate sodium (Colace) 100 mg twice a day ? senna (Senokot) 2 tablets twice a day   Weight Bearing ? Bear weight only on your operated foot in the post-op shoe.   Cast / Splint / Dressing ? Keep your splint, cast or dressing clean and dry.  Don't put anything (coat hanger, pencil, etc) down inside of it.  If it gets damp, use a hair dryer on the cool setting to dry it.  If it gets soaked, call the office to schedule an appointment for a cast change.   After your dressing, cast or splint is removed; you may shower, but do not soak or scrub the wound.  Allow the water to run over it, and then gently pat it dry.  Swelling It is normal for you to have swelling where you had surgery.  To reduce swelling and pain, keep your toes above your nose for at least 3 days after surgery.  It may be necessary to keep your foot or leg elevated for several weeks.  If it hurts, it should be elevated.  Follow Up Call my office at [phone] when you are discharged from the hospital or surgery center to schedule an appointment to be seen two weeks after surgery.  Call my office at [phone] if you develop a fever >101.5 F, nausea, vomiting, bleeding from the surgical site or severe pain.     Regional Anesthesia Blocks  1. Numbness or the inability  to move the "blocked" extremity may last from 3-48 hours after placement. The length of time depends on the medication injected and your individual response to the medication. If the numbness is not going away after 48 hours, call your surgeon.  2. The extremity that is blocked will need to be protected until the numbness is gone and the  Strength has returned. Because you cannot feel it, you will need to take extra care to avoid injury. Because it may be weak, you may have difficulty moving it or using it. You may not know what position it is in without looking at it while the block is in effect.  3. For blocks in the legs and feet, returning to weight bearing and walking needs to be done carefully. You will need to wait until the numbness is entirely gone and the strength has returned. You should be able to move your leg and foot normally before you try and bear weight or walk. You will need someone to be with you when you first try to ensure you do not fall and possibly risk injury.  4. Bruising and tenderness at the needle site are common side effects and will resolve in a few days.  5. Persistent numbness or new problems with movement should be communicated to the surgeon or the [facility] [phone] [place] [phone]).    Information  for Discharge Teaching: EXPAREL (bupivacaine liposome injectable suspension)   Your surgeon or anesthesiologist gave you EXPAREL(bupivacaine) to help control your pain after surgery.  EXPAREL is a local anesthetic that provides pain relief by numbing the tissue around the surgical site. EXPAREL is designed to release pain medication over time and can control pain for up to 72 hours. Depending on how you respond to EXPAREL, you may require less pain medication during your recovery.  Possible side effects: Temporary loss of sensation or ability to move in the area where bupivacaine was injected. Nausea, vomiting,  constipation Rarely, numbness and tingling in your mouth or lips, lightheadedness, or anxiety may occur. Call your doctor right away if you think you may be experiencing any of these sensations, or if you have other questions regarding possible side effects.  Follow all other discharge instructions given to you by your surgeon or nurse. Eat a healthy diet and drink plenty of water or other fluids.  If you return to the hospital for any reason within 96 hours following the administration of EXPAREL, it is important for health care providers to know that you have received this anesthetic. A teal colored band has been placed on your arm with the date, time and amount of EXPAREL you have received in order to alert and inform your health care providers. Please leave this armband in place for the full 96 hours following administration, and then you may remove the band.   Next dose of Tylenol may be given at 2p if needed    Post Anesthesia Home Care Instructions  Activity: Get plenty of rest for the remainder of the day. A responsible individual must stay with you for 24 hours following the procedure.  For the next 24 hours, DO NOT: -Drive a car -Paediatric nurse -Drink alcoholic beverages -Take any medication unless instructed by your physician -Make any legal decisions or sign important papers.  Meals: Start with liquid foods such as gelatin or soup. Progress to regular foods as tolerated. Avoid greasy, spicy, heavy foods. If nausea and/or vomiting occur, drink only clear liquids until the nausea and/or vomiting subsides. Call your physician if vomiting continues.  Special Instructions/Symptoms: Your throat may feel dry or sore from the anesthesia or the breathing tube placed in your throat during surgery. If this causes discomfort, gargle with warm salt water. The discomfort should disappear within 24 hours.  If you had a scopolamine patch placed behind your ear for the management of post-  operative nausea and/or vomiting:  1. The medication in the patch is effective for 72 hours, after which it should be removed.  Wrap patch in a tissue and discard in the trash. Wash hands thoroughly with soap and water. 2. You may remove the patch earlier than 72 hours if you experience unpleasant side effects which may include dry mouth, dizziness or visual disturbances. 3. Avoid touching the patch. Wash your hands with soap and water after contact with the patch.

## 2021-11-30 NOTE — Transfer of Care (Signed)
Immediate Anesthesia Transfer of Care Note  Patient: Jennifer Castaneda  Procedure(s) Performed: BUNION CORRECTION (Right: Toe) first metatarsal and hallux proximal phalanx osteotomies (Right: Toe)  Patient Location: PACU  Anesthesia Type:GA combined with regional for post-op pain  Level of Consciousness: drowsy and patient cooperative  Airway & Oxygen Therapy: Patient Spontanous Breathing and Patient connected to face mask oxygen  Post-op Assessment: Report given to RN and Post -op Vital signs reviewed and stable  Post vital signs: Reviewed and stable  Last Vitals:  Vitals Value Taken Time  BP    Temp    Pulse 78 11/30/21 1001  Resp 19 11/30/21 1001  SpO2 98 % 11/30/21 1001  Vitals shown include unvalidated device data.  Last Pain:  Vitals:   11/30/21 0645  TempSrc: Oral  PainSc: 0-No pain         Complications: No notable events documented.

## 2021-11-30 NOTE — Anesthesia Procedure Notes (Signed)
Anesthesia Regional Block: Popliteal block   Pre-Anesthetic Checklist: , timeout performed,  Correct Patient, Correct Site, Correct Laterality,  Correct Procedure, Correct Position, site marked,  Risks and benefits discussed,  Pre-op evaluation,  At surgeon's request and post-op pain management  Laterality: Right  Prep: Maximum Sterile Barrier Precautions used, chloraprep       Needles:  Injection technique: Single-shot  Needle Type: Echogenic Stimulator Needle     Needle Length: 9cm  Needle Gauge: 21     Additional Needles:   Procedures:,,,, ultrasound used (permanent image in chart),,    Narrative:  Start time: 11/30/2021 7:15 AM End time: 11/30/2021 7:21 AM Injection made incrementally with aspirations every 5 mL. Anesthesiologist: Freddrick March, MD

## 2021-11-30 NOTE — Anesthesia Procedure Notes (Signed)
Procedure Name: Intubation Date/Time: 11/30/2021 7:34 AM  Performed by: Mirel Hundal, Ernesta Amble, CRNAPre-anesthesia Checklist: Patient identified, Emergency Drugs available, Suction available and Patient being monitored Patient Re-evaluated:Patient Re-evaluated prior to induction Oxygen Delivery Method: Circle system utilized Preoxygenation: Pre-oxygenation with 100% oxygen Induction Type: IV induction Ventilation: Mask ventilation without difficulty Grade View: Grade I Tube type: Oral Tube size: 7.0 mm Number of attempts: 1 Airway Equipment and Method: Stylet, Oral airway and Video-laryngoscopy Placement Confirmation: ETT inserted through vocal cords under direct vision, positive ETCO2 and breath sounds checked- equal and bilateral Secured at: 21 cm Tube secured with: Tape Dental Injury: Teeth and Oropharynx as per pre-operative assessment  Difficulty Due To: Difficulty was anticipated

## 2021-11-30 NOTE — H&P (Signed)
Jennifer Castaneda is an 56 y.o. female.   Chief Complaint: right foot pain HPI: 56 y/o female without signficant PMH c/o R foot pain worsening for the last several years.  She has a prominent bunion deformity and has failed non op treatment including activity modification, showear modifcation and toe spacers.  She presents today for surgical treatment for this painful right forefoot deformity.  Past Medical History:  Diagnosis Date   Allergy    Complication of anesthesia    Pt is Native American   History of COVID-19 03/17/2019   retested 06/29/2019 positive result   History of kidney stones    History of nephrolithiasis    Hyperparathyroidism ([place])    parathyroidectomy planned for 05/2017 at [facility]   Hypertension    Migraine headache    PONV (postoperative nausea and vomiting)    Wears glasses     Past Surgical History:  Procedure Laterality Date   BREAST SURGERY     cyst   CESAREAN SECTION     COLONOSCOPY WITH PROPOFOL N/A 11/26/2017   Procedure: COLONOSCOPY WITH PROPOFOL;  Surgeon: Lin Landsman, MD;  Location: [place];  Service: Gastroenterology;  Laterality: N/A;   CYSTOSCOPY/URETEROSCOPY/HOLMIUM LASER/STENT PLACEMENT Right 07/10/2019   Procedure: CYSTOSCOPY/RETROGRADE/URETEROSCOPY/HOLMIUM LASER/ BASKET STONE EXTRACTION/STENT PLACEMENT;  Surgeon: [name], MD;  Location: [facility];  Service: Urology;  Laterality: Right;   left superior parathyroidectomy     2019 at [facility] Dr. Carmin Muskrat   NASAL SINUS SURGERY     PARATHYROIDECTOMY      Family History  Problem Relation Age of Onset   Heart disease Mother    Hypertension Mother    Cancer Neg Hx    Social History:  reports that she quit smoking about 17 years ago. Her smoking use included cigarettes. She has never used smokeless tobacco. She reports that she does not drink alcohol and does not use drugs.  Allergies:  Allergies  Allergen Reactions   Aspartame And Phenylalanine  Swelling   Other Nausea And Vomiting    general anesthesia - severe nausea and vomiting    Phenylalanine Anaphylaxis and Swelling   Asa [Aspirin] Hives and Nausea And Vomiting   Gluten Meal     Sensitivity    Lactose Intolerance (Gi)    Penicillins Hives    Did it involve swelling of the face/tongue/throat, SOB, or low BP? No Did it involve sudden or severe rash/hives, skin peeling, or any reaction on the inside of your mouth or nose? No Did you need to seek medical attention at a hospital or doctor's office? No When did it last happen?      10+ years If all above answers are "NO", may proceed with cephalosporin use.    Skelaxin [Metaxalone] Nausea And Vomiting    Medications Prior to Admission  Medication Sig Dispense Refill   acetaminophen (TYLENOL) 500 MG tablet Take 500-1,000 mg by mouth every 6 (six) hours as needed for moderate pain or headache.      amLODipine (NORVASC) 10 MG tablet TAKE 1 TABLET (10 MG TOTAL) BY MOUTH DAILY. 30 tablet 0   APPLE CIDER VINEGAR PO Take 1 capsule by mouth daily.     Multiple Vitamin (MULTIVITAMIN) tablet Take 2 tablets by mouth daily.      OVER THE COUNTER MEDICATION Take 1-2 tablets by mouth daily. hydroxycut     Melatonin 3 MG CAPS Take 3-6 mg by mouth at bedtime as needed (sleep).      No results  found for this or any previous visit (from the past 48 hour(s)). DG MINI C-ARM IMAGE ONLY  Result Date: 2021-12-13 There is no interpretation for this exam.  This order is for images obtained during a surgical procedure.  Please See "Surgeries" Tab for more information regarding the procedure.    Review of Systems  no recent f/c/n/v/wt loss  Blood pressure 138/84, pulse 69, temperature 97.9 F (36.6 C), temperature source Oral, resp. rate 12, height '5\' 9"'$  (1.753 m), weight 79.1 kg, last menstrual period 04/16/2012, SpO2 100 %. Physical Exam  Wn wd woman in nad.  A and O x 4.  Normal mood and affect.  EOMI.  Resp unlabord.  R forefoot with  prominent bunion deformity.  Skin healthy and intact.  No lymphadenopathy.  5/5 strength in PF and DF of the toes.  Assessment/Plan Painful right bunion deformity - to the OR today for right scarf and akin osteotomies.  The risks and benefits of the alternative treatment options have been discussed in detail.  The patient wishes to proceed with surgery and specifically understands risks of bleeding, infection, nerve damage, blood clots, need for additional surgery, amputation and death.   Wylene Simmer, MD 2021-12-13, 7:18 AM

## 2021-11-30 NOTE — Progress Notes (Signed)
Assisted Dr. Lanetta Inch with right, adductor canal, popliteal, ultrasound guided block. Side rails up, monitors on throughout procedure. See vital signs in flow sheet. Tolerated Procedure well.

## 2021-11-30 NOTE — Op Note (Signed)
11/30/2021  10:04 AM  PATIENT:  Jennifer Castaneda  56 y.o. female  PRE-OPERATIVE DIAGNOSIS: Right foot painful bunion deformity  POST-OPERATIVE DIAGNOSIS: Same  Procedure(s): 1.  Right foot bunion correction with double osteotomy (chevron and Akin) 2.  Right foot AP and lateral radiographs  SURGEON:  Wylene Simmer, MD  ASSISTANT: Mechele Claude, PA-C  ANESTHESIA:   General, regional  EBL:  minimal   TOURNIQUET:   Total Tourniquet Time Documented: Thigh (Right) - 121 minutes Total: Thigh (Right) - 970 minutes  COMPLICATIONS:  None apparent  DISPOSITION:  Extubated, awake and stable to recovery.  INDICATION FOR PROCEDURE: 56 year old female without significant past medical history presents for right foot bunion correction.  She has failed nonoperative treatment to date including activity modification, oral anti-inflammatories and shoewear modification.  The risks and benefits of the alternative treatment options have been discussed in detail.  The patient wishes to proceed with surgery and specifically understands risks of bleeding, infection, nerve damage, blood clots, need for additional surgery, amputation and death.   PROCEDURE IN DETAIL:  After pre operative consent was obtained, and the correct operative site was identified, the patient was brought to the operating room and placed supine on the OR table.  Anesthesia was administered.  Pre-operative antibiotics were administered.  A surgical timeout was taken.  The right lower extremity was elevated and the tourniquet was inflated to 250 mmHg.  An incision was then made along the dorsal medial aspect of the metatarsal neck.  Care was taken to protect the dorsal cutaneous nerve branch.  A chevron osteotomy was then made with a 2 mm Shannon bur.  The osteotomy was mobilized and the head of the metatarsal translated laterally.  The targeting guide for the Stryker MIS system was then pinned into position of the metatarsal head.  An  attempt was made to insert a guidewire.  After several attempts were unsuccessful the targeting guide was abandoned.  The guide was then inserted freehand from the proximal end of the metatarsal at the dorsal medial corner.  It was advanced across the osteotomy into the head of the metatarsal.  A second wire was placed parallel to the first.  The second wire was overdrilled and a fully threaded compression screw inserted.  The more lateral wire was then overdrilled.  The larger fully threaded screw was inserted.  The smaller screw was noted to be dislodged.  It had to be removed.  The larger screw was inserted successfully and had excellent purchase.  Another hole was drilled through the medial cortex through an accessory incision and into the head of the metatarsal.  The smaller screw was inserted and was noted to have excellent purchase.  AP and lateral radiographs confirmed appropriate correction of the intermetatarsal and hallux valgus angles.  The patient was noted to have a small amount of residual hallux valgus interphalangeus.  The decision was made to proceed with an Aiken osteotomy.  A small incision was made adjacent to the proximal phalanx.  Subperiosteal dissection was carried dorsally and plantarly.  The osteotomy was made with a 2 mm Shannon bur.  The osteotomy was closed and fixed with a Stryker 2.2 millimeter screw.  Radiographs confirmed correction of the intermetatarsal and hallux valgus angles.  A conical bur was then used to shave down the prominent medial eminence.  The wounds were irrigated and all bone debris removed.  The tourniquet was released.  The incisions were closed with horizontal mattress sutures of 3-0 nylon.  Sterile  dressings were applied followed by a bunion wrap.  The patient was awakened from anesthesia and transported to the recovery room in stable condition.   FOLLOW UP PLAN: Weightbearing as tolerated on the heel.  No indication for DVT prophylaxis.  Follow-up in the  office in 2 weeks for suture removal and a toe spacer.   RADIOGRAPHS: AP and lateral radiographs of the right foot are obtained intraoperatively.  These show interval correction of the hallux valgus and intermetatarsal angles.  Hardware is appropriately positioned and of the appropriate lengths.    Mechele Claude PA-C was present and scrubbed for the duration of the operative case. His assistance was essential in positioning the patient, prepping and draping, gaining and maintaining exposure, performing the operation, closing and dressing the wounds and applying the splint.

## 2021-11-30 NOTE — Anesthesia Procedure Notes (Signed)
Anesthesia Regional Block: Adductor canal block   Pre-Anesthetic Checklist: , timeout performed,  Correct Patient, Correct Site, Correct Laterality,  Correct Procedure, Correct Position, site marked,  Risks and benefits discussed,  Pre-op evaluation,  At surgeon's request and post-op pain management  Laterality: Right  Prep: Maximum Sterile Barrier Precautions used, chloraprep       Needles:  Injection technique: Single-shot  Needle Type: Echogenic Stimulator Needle     Needle Length: 9cm  Needle Gauge: 21     Additional Needles:   Procedures:,,,, ultrasound used (permanent image in chart),,    Narrative:  Start time: 11/30/2021 7:21 AM End time: 11/30/2021 7:23 AM Injection made incrementally with aspirations every 5 mL. Anesthesiologist: Freddrick March, MD

## 2021-12-01 ENCOUNTER — Encounter (HOSPITAL_BASED_OUTPATIENT_CLINIC_OR_DEPARTMENT_OTHER): Payer: Self-pay | Admitting: Orthopedic Surgery

## 2021-12-01 NOTE — Anesthesia Postprocedure Evaluation (Signed)
Anesthesia Post Note  Patient: Jennifer Castaneda  Procedure(s) Performed: BUNION CORRECTION (Right: Toe) first metatarsal and hallux proximal phalanx osteotomies (Right: Toe)     Patient location during evaluation: PACU Anesthesia Type: Regional and General Level of consciousness: awake and alert Pain management: pain level controlled Vital Signs Assessment: post-procedure vital signs reviewed and stable Respiratory status: spontaneous breathing, nonlabored ventilation, respiratory function stable and patient connected to nasal cannula oxygen Cardiovascular status: blood pressure returned to baseline and stable Postop Assessment: no apparent nausea or vomiting Anesthetic complications: yes   Encounter Notable Events  Notable Event Outcome Phase Comment  Difficult to intubate - expected  <72 hours postprocedure Filed from anesthesia note documentation.    Last Vitals:  Vitals:   11/30/21 1215 11/30/21 1245  BP: 118/66 128/78  Pulse: 63 76  Resp: 20 18  Temp:  36.6 C  SpO2: 98% 99%    Last Pain:  Vitals:   11/30/21 1245  TempSrc:   PainSc: 0-No pain                 Danetta Prom L Shuntae Herzig

## 2021-12-01 NOTE — Progress Notes (Signed)
Left message stating courtesy call and if any questions or concerns please call the doctors office.  

## 2021-12-21 ENCOUNTER — Ambulatory Visit: Payer: 59 | Admitting: Family Medicine

## 2021-12-21 ENCOUNTER — Other Ambulatory Visit: Payer: Self-pay

## 2021-12-21 VITALS — BP 112/68 | HR 69 | Temp 99.7°F | Wt 176.0 lb

## 2021-12-21 DIAGNOSIS — I10 Essential (primary) hypertension: Secondary | ICD-10-CM | POA: Diagnosis not present

## 2021-12-21 DIAGNOSIS — Z0000 Encounter for general adult medical examination without abnormal findings: Secondary | ICD-10-CM | POA: Diagnosis not present

## 2021-12-21 MED ORDER — AMLODIPINE BESYLATE 10 MG PO TABS
ORAL_TABLET | ORAL | 3 refills | Status: DC
  Filled 2021-12-21: qty 90, 90d supply, fill #0
  Filled 2022-08-13: qty 90, 90d supply, fill #1
  Filled 2022-11-28: qty 90, 90d supply, fill #2

## 2021-12-21 NOTE — Progress Notes (Signed)
Subjective:    Patient ID: Jennifer Castaneda, female    DOB: 1966-01-06, 56 y.o.   MRN: [id number]  Medication Refill    Patient is here today for a checkup.  She has a history of hypertension.  She was on amlodipine 10 mg a day but she stopped it 2 or 3 days ago.  Her blood pressure today is still quite low despite stopping the medication.  She has not been checking her blood pressure at home.  She denies any dizziness when she takes the medication.  She denies any leg swelling.  Her last colonoscopy was in 2019.  They recommended a repeat colonoscopy in 4 years.  She had a Pap smear last year in 2022 that was normal.  She schedules her mammograms.   Past Medical History:  Diagnosis Date   Allergy    Complication of anesthesia    Pt is Native American   History of COVID-19 03/17/2019   retested 06/29/2019 positive result   History of kidney stones    History of nephrolithiasis    Hyperparathyroidism ([place])    parathyroidectomy planned for 05/2017 at [facility]   Hypertension    Migraine headache    PONV (postoperative nausea and vomiting)    Wears glasses    Past Surgical History:  Procedure Laterality Date   Barbie Banner OSTEOTOMY Right 11/30/2021   Procedure: first metatarsal and hallux proximal phalanx osteotomies;  Surgeon: Wylene Simmer, MD;  Location: [place];  Service: Orthopedics;  Laterality: Right;   BREAST SURGERY     cyst   BUNIONECTOMY Right 11/30/2021   Procedure: BUNION CORRECTION;  Surgeon: Wylene Simmer, MD;  Location: [place];  Service: Orthopedics;  Laterality: Right;  regional block   CESAREAN SECTION     COLONOSCOPY WITH PROPOFOL N/A 11/26/2017   Procedure: COLONOSCOPY WITH PROPOFOL;  Surgeon: Lin Landsman, MD;  Location: [facility] ENDOSCOPY;  Service: Gastroenterology;  Laterality: N/A;   CYSTOSCOPY/URETEROSCOPY/HOLMIUM LASER/STENT PLACEMENT Right 07/10/2019   Procedure: CYSTOSCOPY/RETROGRADE/URETEROSCOPY/HOLMIUM LASER/ BASKET STONE  EXTRACTION/STENT PLACEMENT;  Surgeon: [name], MD;  Location[facility];  Service: Urology;  Laterality: Right;   left superior parathyroidectomy     2019 at [facility] Dr. Carmin Muskrat   NASAL SINUS SURGERY     PARATHYROIDECTOMY     Current Outpatient Medications on File Prior to Visit  Medication Sig Dispense Refill   acetaminophen (TYLENOL) 500 MG tablet Take 500-1,000 mg by mouth every 6 (six) hours as needed for moderate pain or headache.      APPLE CIDER VINEGAR PO Take 1 capsule by mouth daily.     Melatonin 3 MG CAPS Take 3-6 mg by mouth at bedtime as needed (sleep).     Multiple Vitamin (MULTIVITAMIN) tablet Take 2 tablets by mouth daily.      OVER THE COUNTER MEDICATION Take 1-2 tablets by mouth daily. hydroxycut     No current facility-administered medications on file prior to visit.   Allergies  Allergen Reactions   Aspartame And Phenylalanine Swelling   Other Nausea And Vomiting    general anesthesia - severe nausea and vomiting    Phenylalanine Anaphylaxis and Swelling   Asa [Aspirin] Hives and Nausea And Vomiting   Gluten Meal     Sensitivity    Lactose Intolerance (Gi)    Penicillins Hives    Did it involve swelling of the face/tongue/throat, SOB, or low BP? No Did it involve sudden or severe rash/hives, skin peeling, or any reaction  on the inside of your mouth or nose? No Did you need to seek medical attention at a hospital or doctor's office? No When did it last happen?      10+ years If all above answers are "NO", may proceed with cephalosporin use.    Skelaxin [Metaxalone] Nausea And Vomiting   Social History   Socioeconomic History   Marital status: Married    Spouse name: Not on file   Number of children: Not on file   Years of education: Not on file   Highest education level: Not on file  Occupational History   Not on file  Tobacco Use   Smoking status: Former    Types: Cigarettes    Quit date: 08/14/2004    Years since  quitting: 17.3   Smokeless tobacco: Never  Vaping Use   Vaping Use: Never used  Substance and Sexual Activity   Alcohol use: No   Drug use: No   Sexual activity: Not on file  Other Topics Concern   Not on file  Social History Narrative   Not on file   Social Determinants of Health   Financial Resource Strain: Not on file  Food Insecurity: Not on file  Transportation Needs: Not on file  Physical Activity: Not on file  Stress: Not on file  Social Connections: Not on file  Intimate Partner Violence: Not on file    Review of Systems  All other systems reviewed and are negative.      Objective:   Physical Exam Vitals reviewed.  Constitutional:      General: She is not in acute distress.    Appearance: She is well-developed. She is not diaphoretic.  HENT:     Head: Normocephalic and atraumatic.     Right Ear: External ear normal.     Left Ear: External ear normal.     Nose: Nose normal.     Mouth/Throat:     Pharynx: No oropharyngeal exudate.  Eyes:     General: No scleral icterus.       Right eye: No discharge.        Left eye: No discharge.     Conjunctiva/sclera: Conjunctivae normal.     Pupils: Pupils are equal, round, and reactive to light.  Neck:     Thyroid: No thyromegaly.     Vascular: No JVD.     Trachea: No tracheal deviation.  Cardiovascular:     Rate and Rhythm: Normal rate and regular rhythm.     Heart sounds: Normal heart sounds. No murmur heard.    No friction rub. No gallop.  Pulmonary:     Effort: Pulmonary effort is normal. No respiratory distress.     Breath sounds: Normal breath sounds. No stridor. No wheezing or rales.  Abdominal:     General: Bowel sounds are normal. There is no distension.     Palpations: Abdomen is soft. There is no mass.     Tenderness: There is no abdominal tenderness. There is no guarding or rebound.  Genitourinary:    Vagina: Normal.     Cervix: No cervical motion tenderness, discharge or friability.     Uterus:  Normal.      Adnexa:        Right: No mass, tenderness or fullness.         Left: No mass, tenderness or fullness.    Musculoskeletal:     Cervical back: Normal range of motion and neck supple.  Lymphadenopathy:  Cervical: No cervical adenopathy.  Skin:    General: Skin is warm.     Coloration: Skin is not pale.     Findings: No erythema or rash.  Neurological:     Mental Status: She is alert and oriented to person, place, and time.     Cranial Nerves: No cranial nerve deficit.     Motor: No abnormal muscle tone.     Coordination: Coordination normal.     Deep Tendon Reflexes: Reflexes are normal and symmetric.  Psychiatric:        Behavior: Behavior normal.        Thought Content: Thought content normal.        Judgment: Judgment normal.           Assessment & Plan:  Benign essential HTN - Plan: CBC with Differential/Platelet, Lipid panel, COMPLETE METABOLIC PANEL WITH GFR  General medical exam  Patient's physical exam today is completely normal.  Her blood pressure is outstanding despite being off the medication.  I have asked the patient to stay off the medication for the next 2 weeks and record her blood pressures.  If they remain well controlled, I do not think she needs to take amlodipine.  I will check a CBC, CMP, and a fasting lipid panel.  Her goal LDL cholesterol is less than 130.  The patient will schedule her mammogram.  Her Pap smear was performed last year and is up-to-date.  She is not due for colonoscopy until next year.  Otherwise she denies any concerns.

## 2021-12-22 LAB — CBC WITH DIFFERENTIAL/PLATELET
Absolute Monocytes: 333 cells/uL (ref 200–950)
Basophils Absolute: 49 cells/uL (ref 0–200)
Basophils Relative: 1 %
Eosinophils Absolute: 191 cells/uL (ref 15–500)
Eosinophils Relative: 3.9 %
HCT: 41.3 % (ref 35.0–45.0)
Hemoglobin: 13.8 g/dL (ref 11.7–15.5)
Lymphs Abs: 1798 cells/uL (ref 850–3900)
MCH: 28.9 pg (ref 27.0–33.0)
MCHC: 33.4 g/dL (ref 32.0–36.0)
MCV: 86.6 fL (ref 80.0–100.0)
MPV: 10.6 fL (ref 7.5–12.5)
Monocytes Relative: 6.8 %
Neutro Abs: 2528 cells/uL (ref 1500–7800)
Neutrophils Relative %: 51.6 %
Platelets: 261 10*3/uL (ref 140–400)
RBC: 4.77 10*6/uL (ref 3.80–5.10)
RDW: 13 % (ref 11.0–15.0)
Total Lymphocyte: 36.7 %
WBC: 4.9 10*3/uL (ref 3.8–10.8)

## 2021-12-22 LAB — COMPLETE METABOLIC PANEL WITH GFR
AG Ratio: 1.6 (calc) (ref 1.0–2.5)
ALT: 18 U/L (ref 6–29)
AST: 18 U/L (ref 10–35)
Albumin: 4.5 g/dL (ref 3.6–5.1)
Alkaline phosphatase (APISO): 117 U/L (ref 37–153)
BUN: 15 mg/dL (ref 7–25)
CO2: 29 mmol/L (ref 20–32)
Calcium: 9.8 mg/dL (ref 8.6–10.4)
Chloride: 104 mmol/L (ref 98–110)
Creat: 0.84 mg/dL (ref 0.50–1.03)
Globulin: 2.8 g/dL (calc) (ref 1.9–3.7)
Glucose, Bld: 91 mg/dL (ref 65–99)
Potassium: 4 mmol/L (ref 3.5–5.3)
Sodium: 141 mmol/L (ref 135–146)
Total Bilirubin: 0.5 mg/dL (ref 0.2–1.2)
Total Protein: 7.3 g/dL (ref 6.1–8.1)
eGFR: 81 mL/min/{1.73_m2} (ref >=60)

## 2021-12-22 LAB — LIPID PANEL
Cholesterol: 220 mg/dL — ABNORMAL HIGH (ref <200)
HDL: 68 mg/dL (ref >=50)
LDL Cholesterol (Calc): 129 mg/dL (calc) — ABNORMAL HIGH
Non-HDL Cholesterol (Calc): 152 mg/dL (calc) — ABNORMAL HIGH (ref <130)
Total CHOL/HDL Ratio: 3.2 (calc) (ref <5.0)
Triglycerides: 120 mg/dL (ref <150)

## 2022-01-05 ENCOUNTER — Encounter: Payer: 59 | Admitting: Family Medicine

## 2022-01-17 DIAGNOSIS — M25552 Pain in left hip: Secondary | ICD-10-CM | POA: Diagnosis not present

## 2022-01-17 DIAGNOSIS — M79671 Pain in right foot: Secondary | ICD-10-CM | POA: Diagnosis not present

## 2022-01-17 DIAGNOSIS — Z4789 Encounter for other orthopedic aftercare: Secondary | ICD-10-CM | POA: Diagnosis not present

## 2022-02-01 DIAGNOSIS — M25552 Pain in left hip: Secondary | ICD-10-CM | POA: Diagnosis not present

## 2022-02-06 DIAGNOSIS — M25552 Pain in left hip: Secondary | ICD-10-CM | POA: Diagnosis not present

## 2022-02-06 DIAGNOSIS — M79671 Pain in right foot: Secondary | ICD-10-CM | POA: Diagnosis not present

## 2022-02-12 DIAGNOSIS — M79671 Pain in right foot: Secondary | ICD-10-CM | POA: Diagnosis not present

## 2022-02-12 DIAGNOSIS — M25552 Pain in left hip: Secondary | ICD-10-CM | POA: Diagnosis not present

## 2022-02-16 DIAGNOSIS — M2021 Hallux rigidus, right foot: Secondary | ICD-10-CM | POA: Diagnosis not present

## 2022-02-16 DIAGNOSIS — Z4789 Encounter for other orthopedic aftercare: Secondary | ICD-10-CM | POA: Diagnosis not present

## 2022-02-19 DIAGNOSIS — M79671 Pain in right foot: Secondary | ICD-10-CM | POA: Diagnosis not present

## 2022-02-19 DIAGNOSIS — M25552 Pain in left hip: Secondary | ICD-10-CM | POA: Diagnosis not present

## 2022-02-27 DIAGNOSIS — M79671 Pain in right foot: Secondary | ICD-10-CM | POA: Diagnosis not present

## 2022-02-27 DIAGNOSIS — M25552 Pain in left hip: Secondary | ICD-10-CM | POA: Diagnosis not present

## 2022-03-06 DIAGNOSIS — M79671 Pain in right foot: Secondary | ICD-10-CM | POA: Diagnosis not present

## 2022-03-06 DIAGNOSIS — M25552 Pain in left hip: Secondary | ICD-10-CM | POA: Diagnosis not present

## 2022-03-28 DIAGNOSIS — M21611 Bunion of right foot: Secondary | ICD-10-CM | POA: Diagnosis not present

## 2022-08-13 ENCOUNTER — Other Ambulatory Visit: Payer: Self-pay

## 2022-08-29 DIAGNOSIS — N200 Calculus of kidney: Secondary | ICD-10-CM | POA: Diagnosis not present

## 2023-03-13 ENCOUNTER — Other Ambulatory Visit: Payer: Self-pay

## 2023-03-14 ENCOUNTER — Other Ambulatory Visit: Payer: Self-pay | Admitting: Family Medicine

## 2023-03-15 ENCOUNTER — Other Ambulatory Visit: Payer: Self-pay

## 2023-03-15 MED ORDER — LIDOCAINE VISCOUS HCL 2 % MT SOLN
OROMUCOSAL | 0 refills | Status: DC
  Filled 2023-03-15: qty 240, 14d supply, fill #0

## 2023-03-15 MED ORDER — LIDOCAINE VISCOUS HCL 2 % MT SOLN
10.0000 mL | OROMUCOSAL | 0 refills | Status: DC
  Filled 2023-03-15: qty 240, 14d supply, fill #0

## 2023-03-17 ENCOUNTER — Other Ambulatory Visit: Payer: Self-pay

## 2023-03-17 ENCOUNTER — Other Ambulatory Visit: Payer: Self-pay | Admitting: Family Medicine

## 2023-03-18 ENCOUNTER — Other Ambulatory Visit: Payer: Self-pay

## 2023-03-19 ENCOUNTER — Other Ambulatory Visit: Payer: Self-pay

## 2023-03-20 NOTE — Telephone Encounter (Signed)
Requested medication (s) are due for refill today - yes  Requested medication (s) are on the active medication list -yes  Future visit scheduled -no  Last refill: 12/21/21 #90 3RF  Notes to clinic: last OV 12/21/21- last refill has notes- sent for review of request   Requested Prescriptions  Pending Prescriptions Disp Refills   amLODipine (NORVASC) 10 MG tablet 90 tablet 3    Sig: TAKE 1 TABLET (10 MG TOTAL) BY MOUTH DAILY.     Cardiovascular: Calcium Channel Blockers 2 Failed - 03/17/2023  9:17 PM      Failed - Valid encounter within last 6 months    Recent Outpatient Visits           2 years ago Rash   [facility] Valentino Nose, NP   2 years ago General medical exam   [facility] Tanya Nones, Priscille Heidelberg, MD   3 years ago Bladder spasms   [facility] Tanya Nones, Priscille Heidelberg, MD   4 years ago Hematuria, unspecified type   [facility] Danelle Berry, PA-C   5 years ago General medical exam   [facility] Donita Brooks, MD              Passed - Last BP in normal range    BP Readings from Last 1 Encounters:  12/21/21 112/68         Passed - Last Heart Rate in normal range    Pulse Readings from Last 1 Encounters:  12/21/21 69            Requested Prescriptions  Pending Prescriptions Disp Refills   amLODipine (NORVASC) 10 MG tablet 90 tablet 3    Sig: TAKE 1 TABLET (10 MG TOTAL) BY MOUTH DAILY.     Cardiovascular: Calcium Channel Blockers 2 Failed - 03/17/2023  9:17 PM      Failed - Valid encounter within last 6 months    Recent Outpatient Visits           2 years ago Rash   [facility] Valentino Nose, NP   2 years ago General medical exam   [facility] Tanya Nones Priscille Heidelberg, MD   3 years ago Bladder spasms   [facility] Tanya Nones, Priscille Heidelberg, MD   4 years ago Hematuria, unspecified type   [facility] Danelle Berry,  PA-C   5 years ago General medical exam   [facility] Donita Brooks, MD              Passed - Last BP in normal range    BP Readings from Last 1 Encounters:  12/21/21 112/68         Passed - Last Heart Rate in normal range    Pulse Readings from Last 1 Encounters:  12/21/21 69

## 2023-03-21 ENCOUNTER — Other Ambulatory Visit: Payer: Self-pay

## 2023-03-21 ENCOUNTER — Telehealth: Payer: Self-pay

## 2023-03-21 MED ORDER — AMLODIPINE BESYLATE 10 MG PO TABS
10.0000 mg | ORAL_TABLET | Freq: Every day | ORAL | 0 refills | Status: DC
  Filled 2023-03-21: qty 30, 30d supply, fill #0

## 2023-03-21 NOTE — Telephone Encounter (Signed)
Called and spoke w/this morning. Told pt that since she has not seen her pcp for over a year, that she will need to be seen before she can be treated. Pt schedule annual for Jan. 2025. Suggested to pt that she will need to go to UC for treatment, pt voiced understanding.   Pt also aware that she will get a 30days courtesy refill for her amlodipine, however pt must keep appt for future refills. Pt voiced understanding.

## 2023-03-21 NOTE — Telephone Encounter (Signed)
Copied from CRM [id number]. Topic: Clinical - Medical Advice >> Mar 21, 2023 11:49 AM Conni Elliot wrote: Reason for CRM: pt would like to speak to nurse regarding sending an rx, pt has been feeling sick for days

## 2023-03-22 ENCOUNTER — Other Ambulatory Visit: Payer: Self-pay

## 2023-03-24 ENCOUNTER — Ambulatory Visit
Admission: RE | Admit: 2023-03-24 | Discharge: 2023-03-24 | Disposition: A | Discharge status: Home / Self Care | Payer: Commercial Managed Care - PPO | Source: Ambulatory Visit | Attending: Emergency Medicine | Admitting: Emergency Medicine

## 2023-03-24 VITALS — BP 138/87 | HR 78 | Temp 98.9°F | Resp 18 | Ht 69.5 in | Wt 178.0 lb

## 2023-03-24 DIAGNOSIS — J0110 Acute frontal sinusitis, unspecified: Secondary | ICD-10-CM

## 2023-03-24 MED ORDER — DOXYCYCLINE HYCLATE 100 MG PO CAPS: 100.0000 mg | ORAL_CAPSULE | Freq: Two times a day (BID) | ORAL | 0 refills | Status: AC

## 2023-03-24 MED ORDER — PREDNISONE 10 MG (21) PO TBPK: ORAL_TABLET | Freq: Every day | ORAL | 0 refills | Status: DC

## 2023-03-24 NOTE — Discharge Instructions (Signed)
Today you are being treated for sinus infection  Begin doxycycline every morning and every evening for 10 days will take 48 hours to fully peak within your system  Take prednisone as directed, take with food, helps to reduce inflammation and ideally will calm your sinus pressure    You can take Tylenol and/or Ibuprofen as needed for fever reduction and pain relief.   For cough: honey 1/2 to 1 teaspoon (you can dilute the honey in water or another fluid).  You can also use guaifenesin and dextromethorphan for cough. You can use a humidifier for chest congestion and cough.  If you don't have a humidifier, you can sit in the bathroom with the hot shower running.      For sore throat: try warm salt water gargles, cepacol lozenges, throat spray, warm tea or water with lemon/honey, popsicles or ice, or OTC cold relief medicine for throat discomfort.   For congestion: take a daily anti-histamine like Zyrtec, Claritin, and a oral decongestant, such as pseudoephedrine.  You can also use Flonase 1-2 sprays in each nostril daily.   It is important to stay hydrated: drink plenty of fluids (water, gatorade/powerade/pedialyte, juices, or teas) to keep your throat moisturized and help further relieve irritation/discomfort.

## 2023-03-24 NOTE — ED Triage Notes (Signed)
Patient states over a week of headache, cough/congestion, sinus/pressure, headaches.  Taking OTC Sudafed with little relief

## 2023-03-24 NOTE — ED Provider Notes (Signed)
Renaldo Fiddler    CSN: [id number] Arrival date & time: 03/24/23  1328      History   Chief Complaint Chief Complaint  Patient presents with   Facial Pain   Headache   Cough    HPI Jennifer Castaneda is a 57 y.o. female.   Patient presents for evaluation of nasal congestion, rhinorrhea, productive cough, sinus pressure along the forehead and behind the eyes, bilateral itchy ears, postnasal drip, irritated throat and wheezing present for 10 days.  Cough is productive with green to yellow sputum with a foul tasting odor.  Associated subjective fever.  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  [employer] and liquids.  Has attempted use of Sudafed and Advil cold and flu.  Past Medical History:  Diagnosis Date   Allergy    Complication of anesthesia    Pt is Native American   History of COVID-19 03/17/2019   retested 06/29/2019 positive result   History of kidney stones    History of nephrolithiasis    Hyperparathyroidism ([facility])    parathyroidectomy planned for 05/2017 at [facility]   Hypertension    Migraine headache    PONV (postoperative nausea and vomiting)    Wears glasses     Patient Active Problem List   Diagnosis Date Noted   Kidney stones 12/30/2017   Encounter for screening colonoscopy    Hyperparathyroidism ([facility])    Hypertension    Migraines    History of nephrolithiasis    Allergy     Past Surgical History:  Procedure Laterality Date   AIKEN OSTEOTOMY Right 11/30/2021   Procedure: first metatarsal and hallux proximal phalanx osteotomies;  Surgeon: Toni Arthurs, MD;  Location: [facility];  Service: Orthopedics;  Laterality: Right;   BREAST SURGERY     cyst   BUNIONECTOMY Right 11/30/2021   Procedure: BUNION CORRECTION;  Surgeon: Toni Arthurs, MD;  Location: [facility];  Service: Orthopedics;  Laterality: Right;  regional block   CESAREAN SECTION     COLONOSCOPY WITH PROPOFOL N/A 11/26/2017   Procedure:  COLONOSCOPY WITH PROPOFOL;  Surgeon: Toney Reil, MD;  Location: [facility];  Service: Gastroenterology;  Laterality: N/A;   CYSTOSCOPY/URETEROSCOPY/HOLMIUM LASER/STENT PLACEMENT Right 07/10/2019   Procedure: CYSTOSCOPY/RETROGRADE/URETEROSCOPY/HOLMIUM LASER/ BASKET STONE EXTRACTION/STENT PLACEMENT;  Surgeon: Jerilee Field, MD;  Location: [facility];  Service: Urology;  Laterality: Right;   left superior parathyroidectomy     2019 at [facility] Dr. Truitt Merle   NASAL SINUS SURGERY     PARATHYROIDECTOMY      OB History   No obstetric history on file.      Home Medications    Prior to Admission medications   Medication Sig Start Date End Date Taking? Authorizing Provider  doxycycline (VIBRAMYCIN) 100 MG capsule Take 1 capsule (100 mg total) by mouth 2 (two) times daily. 03/24/23  Yes Mikie Misner R, NP  predniSONE (STERAPRED UNI-PAK 21 TAB) 10 MG (21) TBPK tablet Take by mouth daily. Take 6 tabs by mouth daily  for 1 days, then 5 tabs for 1 days, then 4 tabs for 1 days, then 3 tabs for 1 days, 2 tabs for 1 days, then 1 tab by mouth daily for 1 days 03/24/23  Yes Joee Iovine R, NP  acetaminophen (TYLENOL) 500 MG tablet Take 500-1,000 mg by mouth every 6 (six) hours as needed for moderate pain or headache.     [provider]  amLODipine (NORVASC) 10 MG tablet  Take 1 tablet (10 mg total) by mouth daily. 03/21/23   Donita Brooks, MD  APPLE CIDER VINEGAR PO Take 1 capsule by mouth daily.    [provider]  dexamethasone-lidocaine in diphenhydrAMINE liquid-alum & mag hydroxide-simeth suspension Swish and rinse thoroughly with 10 mL for at least 1 minute. Spit out excess but do not rinse for 15 minutes. 03/15/23     Melatonin 3 MG CAPS Take 3-6 mg by mouth at bedtime as needed (sleep).    [provider]  Multiple Vitamin (MULTIVITAMIN) tablet Take 2 tablets by mouth daily.     [provider]  OVER THE COUNTER MEDICATION Take  1-2 tablets by mouth daily. hydroxycut    [provider]    Family History Family History  Problem Relation Age of Onset   Heart disease Mother    Hypertension Mother    Cancer Neg Hx     Social History Social History   Tobacco Use   Smoking status: Former    Current packs/day: 0.00    Types: Cigarettes    Quit date: 08/14/2004    Years since quitting: 18.6   Smokeless tobacco: Never  Vaping Use   Vaping status: Never Used  Substance Use Topics   Alcohol use: No   Drug use: No     Allergies   Aspartame and phenylalanine, Other, Phenylalanine, Asa [aspirin], Gluten meal, Lactose intolerance (gi), Penicillins, and Skelaxin [metaxalone]   Review of Systems Review of Systems  Respiratory:  Positive for cough.   Neurological:  Positive for headaches.     Physical Exam Triage Vital Signs ED Triage Vitals  Encounter Vitals Group     BP 03/24/23 1358 138/87     Systolic BP Percentile --      Diastolic BP Percentile --      Pulse Rate 03/24/23 1358 78     Resp 03/24/23 1358 18     Temp 03/24/23 1358 98.9 F (37.2 C)     Temp Source 03/24/23 1358 Oral     SpO2 03/24/23 1358 97 %     Weight 03/24/23 1355 178 lb (80.7 kg)     Height 03/24/23 1355 5' 9.5" (1.765 m)     Head Circumference --      Peak Flow --      Pain Score 03/24/23 1355 4     Pain Loc --      Pain Education --      Exclude from Growth Chart --    No data found.  Updated Vital Signs BP 138/87 (BP Location: Left Arm)   Pulse 78   Temp 98.9 F (37.2 C) (Oral)   Resp 18   Ht 5' 9.5" (1.765 m)   Wt 178 lb (80.7 kg)   LMP 04/16/2012   SpO2 97%   BMI 25.91 kg/m   Visual Acuity Right Eye Distance:   Left Eye Distance:   Bilateral Distance:    Right Eye Near:   Left Eye Near:    Bilateral Near:     Physical Exam Constitutional:      Appearance: Normal appearance.  HENT:     Head: Normocephalic.     Right Ear: Tympanic membrane, ear canal and external ear normal.     Left  Ear: Tympanic membrane, ear canal and external ear normal.     Nose: Congestion present. No rhinorrhea.     Right Sinus: Frontal sinus tenderness present. No maxillary sinus tenderness.     Left Sinus:  Frontal sinus tenderness present. No maxillary sinus tenderness.     Mouth/Throat:     Mouth: Mucous membranes are moist.     Pharynx: Oropharynx is clear. Posterior oropharyngeal erythema present. No oropharyngeal exudate.  Eyes:     Extraocular Movements: Extraocular movements intact.  Cardiovascular:     Rate and Rhythm: Normal rate and regular rhythm.     Pulses: Normal pulses.     Heart sounds: Normal heart sounds.  Pulmonary:     Effort: Pulmonary effort is normal.     Breath sounds: Normal breath sounds.  Neurological:     Mental Status: She is alert and oriented to person, place, and time. Mental status is at baseline.      UC Treatments / Results  Labs (all labs ordered are listed, but only abnormal results are displayed) Labs Reviewed - No data to display  EKG   Radiology No results found.  Procedures Procedures (including critical care time)  Medications Ordered in UC Medications - No data to display  Initial Impression / Assessment and Plan / UC Course  I have reviewed the triage vital signs and the nursing notes.  Pertinent labs & imaging results that were available during my care of the patient were reviewed by me and considered in my medical decision making (see chart for details).  Acute Non recurrent frontal sinusitis  Patient is in no signs of distress nor toxic appearing.  Vital signs are stable.  Low suspicion for pneumonia, pneumothorax or bronchitis and therefore will defer imaging.  Presentation and symptomology consistent with a sinusitis.  Viral testing deferred.  Prescribed doxycycline and prednisone.May use additional over-the-counter medications as needed for supportive care.  May follow-up with urgent care as needed if symptoms persist or  worsen.   Final Clinical Impressions(s) / UC Diagnoses   Final diagnoses:  Acute non-recurrent frontal sinusitis     Discharge Instructions      Today you are being treated for sinus infection  Begin doxycycline every morning and every evening for 10 days will take 48 hours to fully peak within your system  Take prednisone as directed, take with food, helps to reduce inflammation and ideally will calm your sinus pressure    You can take Tylenol and/or Ibuprofen as needed for fever reduction and pain relief.   For cough: honey 1/2 to 1 teaspoon (you can dilute the honey in water or another fluid).  You can also use guaifenesin and dextromethorphan for cough. You can use a humidifier for chest congestion and cough.  If you don't have a humidifier, you can sit in the bathroom with the hot shower running.      For sore throat: try warm salt water gargles, cepacol lozenges, throat spray, warm tea or water with lemon/honey, popsicles or ice, or OTC cold relief medicine for throat discomfort.   For congestion: take a daily anti-histamine like Zyrtec, Claritin, and a oral decongestant, such as pseudoephedrine.  You can also use Flonase 1-2 sprays in each nostril daily.   It is important to stay hydrated: drink plenty of fluids (water, gatorade/powerade/pedialyte, juices, or teas) to keep your throat moisturized and help further relieve irritation/discomfort.    ED Prescriptions     Medication Sig Dispense Auth. Provider   doxycycline (VIBRAMYCIN) 100 MG capsule Take 1 capsule (100 mg total) by mouth 2 (two) times daily. 20 capsule Aracelie Addis R, NP   predniSONE (STERAPRED UNI-PAK 21 TAB) 10 MG (21) TBPK tablet Take by mouth daily. Take  6 tabs by mouth daily  for 1 days, then 5 tabs for 1 days, then 4 tabs for 1 days, then 3 tabs for 1 days, 2 tabs for 1 days, then 1 tab by mouth daily for 1 days 21 tablet Marny Smethers, Elita Boone, NP      PDMP not reviewed this encounter.   Valinda Hoar, NP 03/24/23 1419

## 2023-03-25 ENCOUNTER — Ambulatory Visit: Payer: Self-pay

## 2023-04-25 ENCOUNTER — Other Ambulatory Visit: Payer: Self-pay

## 2023-04-25 ENCOUNTER — Ambulatory Visit (INDEPENDENT_AMBULATORY_CARE_PROVIDER_SITE_OTHER): Payer: 59 | Admitting: Family Medicine

## 2023-04-25 ENCOUNTER — Encounter: Payer: Self-pay | Admitting: Family Medicine

## 2023-04-25 VITALS — BP 120/63 | HR 80 | Temp 97.7°F | Ht 69.5 in | Wt 183.0 lb

## 2023-04-25 DIAGNOSIS — I10 Essential (primary) hypertension: Secondary | ICD-10-CM

## 2023-04-25 DIAGNOSIS — Z0001 Encounter for general adult medical examination with abnormal findings: Secondary | ICD-10-CM | POA: Diagnosis not present

## 2023-04-25 DIAGNOSIS — E7800 Pure hypercholesterolemia, unspecified: Secondary | ICD-10-CM | POA: Diagnosis not present

## 2023-04-25 DIAGNOSIS — Z23 Encounter for immunization: Secondary | ICD-10-CM | POA: Diagnosis not present

## 2023-04-25 DIAGNOSIS — Z1231 Encounter for screening mammogram for malignant neoplasm of breast: Secondary | ICD-10-CM | POA: Diagnosis not present

## 2023-04-25 DIAGNOSIS — Z0000 Encounter for general adult medical examination without abnormal findings: Secondary | ICD-10-CM | POA: Diagnosis not present

## 2023-04-25 DIAGNOSIS — Z124 Encounter for screening for malignant neoplasm of cervix: Secondary | ICD-10-CM

## 2023-04-25 DIAGNOSIS — Z1211 Encounter for screening for malignant neoplasm of colon: Secondary | ICD-10-CM | POA: Diagnosis not present

## 2023-04-25 MED ORDER — ESCITALOPRAM OXALATE 10 MG PO TABS
10.0000 mg | ORAL_TABLET | Freq: Every day | ORAL | 5 refills | Status: AC
  Filled 2023-04-25: qty 30, 30d supply, fill #0
  Filled 2023-07-18: qty 30, 30d supply, fill #1
  Filled 2023-10-14: qty 30, 30d supply, fill #2
  Filled 2023-12-18: qty 30, 30d supply, fill #3
  Filled 2024-03-10: qty 30, 30d supply, fill #4

## 2023-04-25 MED ORDER — AMLODIPINE BESYLATE 10 MG PO TABS
10.0000 mg | ORAL_TABLET | Freq: Every day | ORAL | 2 refills | Status: AC
  Filled 2023-04-25: qty 90, 90d supply, fill #0
  Filled 2023-08-23: qty 90, 90d supply, fill #1
  Filled 2023-12-18: qty 90, 90d supply, fill #2

## 2023-04-25 NOTE — Addendum Note (Signed)
 Addended by: Lynnea Ferrier T on: 04/25/2023 09:25 AM   Modules accepted: Orders

## 2023-04-25 NOTE — Addendum Note (Signed)
 Addended by: Venia Carbon K on: 04/25/2023 11:28 AM   Modules accepted: Orders

## 2023-04-25 NOTE — Progress Notes (Addendum)
 Subjective:    Patient ID: Jennifer Castaneda, female    DOB: 01/22/1966, 58 y.o.   MRN: [id number] 58 y/o here for CPE.  ROS negative.  Last mammogram-2022 Last colonoscopy- 2019- recommended repeat after 5 years Last pap-2022 All appear to be due. Also due for shingrix .  Received flu shot elsewhere. Immunization History  Administered Date(s) Administered   DTaP 02/09/1965, 03/14/1965, 04/15/1965, 02/20/1967   Diphtheria 03/19/1978   Hepatitis A 05/15/2006   IPV 08/16/1965, 11/29/1965, 11/25/1969, 05/06/1979, 05/06/1980   Measles 05/25/1966   PFIZER(Purple Top)SARS-COV-2 Vaccination 04/18/2019, 05/06/2019, 04/26/2020   Rubella 03/19/1978   Smallpox 01/18/1968   Td 03/19/1978   Typhoid Inactivated 05/15/2006    Past Medical History:  Diagnosis Date   Allergy    Complication of anesthesia    Pt is Native American   History of COVID-19 03/17/2019   retested 06/29/2019 positive result   History of kidney stones    History of nephrolithiasis    Hyperparathyroidism (HCC)    parathyroidectomy planned for 05/2017 at [facility]   Hypertension    Migraine headache    PONV (postoperative nausea and vomiting)    Wears glasses    Past Surgical History:  Procedure Laterality Date   KATRINA OSTEOTOMY Right 11/30/2021   Procedure: first metatarsal and hallux proximal phalanx osteotomies;  Surgeon: Kit Rush, MD;  Location: [facility];  Service: Orthopedics;  Laterality: Right;   BREAST SURGERY     cyst   BUNIONECTOMY Right 11/30/2021   Procedure: BUNION CORRECTION;  Surgeon: Kit Rush, MD;  Location:  SURGERY CENTER;  Service: Orthopedics;  Laterality: Right;  regional block   CESAREAN SECTION     COLONOSCOPY WITH PROPOFOL  N/A 11/26/2017   Procedure: COLONOSCOPY WITH PROPOFOL ;  Surgeon: Unk Corinn Skiff, MD;  Location: [facility];  Service: Gastroenterology;  Laterality: N/A;   CYSTOSCOPY/URETEROSCOPY/HOLMIUM LASER/STENT PLACEMENT Right 07/10/2019    Procedure: CYSTOSCOPY/RETROGRADE/URETEROSCOPY/HOLMIUM LASER/ BASKET STONE EXTRACTION/STENT PLACEMENT;  Surgeon: Nieves Cough, MD;  Location: [facility];  Service: Urology;  Laterality: Right;   left superior parathyroidectomy     2019 at [facility] Dr. Jerilynn Cramp   NASAL SINUS SURGERY     PARATHYROIDECTOMY     Current Outpatient Medications on File Prior to Visit  Medication Sig Dispense Refill   acetaminophen  (TYLENOL ) 500 MG tablet Take 500-1,000 mg by mouth every 6 (six) hours as needed for moderate pain or headache.      amLODipine  (NORVASC ) 10 MG tablet Take 1 tablet (10 mg total) by mouth daily. 30 tablet 0   APPLE CIDER VINEGAR PO Take 1 capsule by mouth daily.     dexamethasone -lidocaine  in diphenhydrAMINE liquid-alum & mag hydroxide-simeth suspension Swish and rinse thoroughly with 10 mL for at least 1 minute. Spit out excess but do not rinse for 15 minutes. 240 mL 0   doxycycline  (VIBRAMYCIN ) 100 MG capsule Take 1 capsule (100 mg total) by mouth 2 (two) times daily. 20 capsule 0   Melatonin 3 MG CAPS Take 3-6 mg by mouth at bedtime as needed (sleep).     Multiple Vitamin (MULTIVITAMIN) tablet Take 2 tablets by mouth daily.      OVER THE COUNTER MEDICATION Take 1-2 tablets by mouth daily. hydroxycut     predniSONE  (STERAPRED UNI-PAK 21 TAB) 10 MG (21) TBPK tablet Take by mouth daily. Take 6 tabs by mouth daily  for 1 days, then 5 tabs for 1 days, then 4 tabs for 1 days, then 3 tabs for 1  days, 2 tabs for 1 days, then 1 tab by mouth daily for 1 days 21 tablet 0   No current facility-administered medications on file prior to visit.   Allergies  Allergen Reactions   Aspartame And Phenylalanine Swelling   Other Nausea And Vomiting    general anesthesia - severe nausea and vomiting    Phenylalanine Anaphylaxis and Swelling   Asa [Aspirin] Hives and Nausea And Vomiting   Gluten Meal     Sensitivity    Lactose Intolerance (Gi)    Penicillins Hives    Did it involve  swelling of the face/tongue/throat, SOB, or low BP? No Did it involve sudden or severe rash/hives, skin peeling, or any reaction on the inside of your mouth or nose? No Did you need to seek medical attention at a hospital or doctor's office? No When did it last happen?      10+ years If all above answers are "NO", may proceed with cephalosporin use.    Skelaxin  [Metaxalone ] Nausea And Vomiting   Social History   Socioeconomic History   Marital status: Married    Spouse name: Not on file   Number of children: Not on file   Years of education: Not on file   Highest education level: Bachelor's degree (e.g., BA, AB, BS)  Occupational History   Not on file  Tobacco Use   Smoking status: Former    Current packs/day: 0.00    Types: Cigarettes    Quit date: 08/14/2004    Years since quitting: 18.7   Smokeless tobacco: Never  Vaping Use   Vaping status: Never Used  Substance and Sexual Activity   Alcohol use: No   Drug use: No   Sexual activity: Not on file  Other Topics Concern   Not on file  Social History Narrative   Not on file   Social Drivers of Health   Financial Resource Strain: Low Risk  (04/19/2023)   Overall Financial Resource Strain (CARDIA)    Difficulty of Paying Living Expenses: Not very hard  Food Insecurity: No Food Insecurity (04/19/2023)   Hunger Vital Sign    Worried About Running Out of Food in the Last Year: Never true    Ran Out of Food in the Last Year: Never true  Transportation Needs: No Transportation Needs (04/19/2023)   PRAPARE - Administrator, Civil Service (Medical): No    Lack of Transportation (Non-Medical): No  Physical Activity: Insufficiently Active (04/19/2023)   Exercise Vital Sign    Days of Exercise per Week: 3 days    Minutes of Exercise per Session: 30 min  Stress: Stress Concern Present (04/19/2023)   [employer] of Occupational Health - Occupational Stress Questionnaire    Feeling of Stress : To some extent  Social  Connections: Moderately Integrated (04/19/2023)   Social Connection and Isolation Panel [NHANES]    Frequency of Communication with Friends and Family: Once a week    Frequency of Social Gatherings with Friends and Family: Once a week    Attends Religious Services: 1 to 4 times per year    Active Member of [employer] or Organizations: Yes    Attends Engineer, Structural: More than 4 times per year    Marital Status: Married  Catering Manager Violence: Not on file    Review of Systems  All other systems reviewed and are negative.      Objective:   Physical Exam Vitals reviewed.  Constitutional:  General: She is not in acute distress.    Appearance: She is well-developed. She is not diaphoretic.  HENT:     Head: Normocephalic and atraumatic.     Right Ear: External ear normal.     Left Ear: External ear normal.     Nose: Nose normal.     Mouth/Throat:     Pharynx: No oropharyngeal exudate.  Eyes:     General: No scleral icterus.       Right eye: No discharge.        Left eye: No discharge.     Conjunctiva/sclera: Conjunctivae normal.     Pupils: Pupils are equal, round, and reactive to light.  Neck:     Thyroid : No thyromegaly.     Vascular: No JVD.     Trachea: No tracheal deviation.  Cardiovascular:     Rate and Rhythm: Normal rate and regular rhythm.     Heart sounds: Normal heart sounds. No murmur heard.    No friction rub. No gallop.  Pulmonary:     Effort: Pulmonary effort is normal. No respiratory distress.     Breath sounds: Normal breath sounds. No stridor. No wheezing or rales.  Abdominal:     General: Bowel sounds are normal. There is no distension.     Palpations: Abdomen is soft. There is no mass.     Tenderness: There is no abdominal tenderness. There is no guarding or rebound.  Genitourinary:    Vagina: Normal.     Cervix: No cervical motion tenderness, discharge or friability.     Uterus: Normal.      Adnexa:        Right: No mass, tenderness  or fullness.         Left: No mass, tenderness or fullness.    Musculoskeletal:     Cervical back: Normal range of motion and neck supple.  Lymphadenopathy:     Cervical: No cervical adenopathy.  Skin:    General: Skin is warm.     Coloration: Skin is not pale.     Findings: No erythema or rash.  Neurological:     Mental Status: She is alert and oriented to person, place, and time.     Cranial Nerves: No cranial nerve deficit.     Motor: No abnormal muscle tone.     Coordination: Coordination normal.     Deep Tendon Reflexes: Reflexes are normal and symmetric.  Psychiatric:        Behavior: Behavior normal.        Thought Content: Thought content normal.        Judgment: Judgment normal.           Assessment & Plan:  General medical exam - Plan: CBC with Differential/Platelet, COMPLETE METABOLIC PANEL WITH GFR, Lipid panel  Benign essential HTN - Plan: CBC with Differential/Platelet, COMPLETE METABOLIC PANEL WITH GFR, Lipid panel  Pure hypercholesterolemia  Colon cancer screening - Plan: Ambulatory referral to Gastroenterology  Encounter for screening mammogram for malignant neoplasm of breast - Plan: MM Digital Screening Schedule mammogram and colonoscopy.  Check cbc, cmp, flp.  BP at goal.  Recommended shingles shot.  Patient had flu shot elsewhere.  Pap sent to pathology in a labeled container.  Does report daily anxiety for months.  Would like to try lexapro  10 mg poqday for possible gad.

## 2023-04-26 LAB — LIPID PANEL
Cholesterol: 224 mg/dL — ABNORMAL HIGH (ref <200)
HDL: 75 mg/dL (ref >=50)
LDL Cholesterol (Calc): 132 mg/dL — ABNORMAL HIGH
Non-HDL Cholesterol (Calc): 149 mg/dL — ABNORMAL HIGH (ref <130)
Total CHOL/HDL Ratio: 3 (calc) (ref <5.0)
Triglycerides: 71 mg/dL (ref <150)

## 2023-04-26 LAB — CBC WITH DIFFERENTIAL/PLATELET
Absolute Lymphocytes: 2003 {cells}/uL (ref 850–3900)
Absolute Monocytes: 460 {cells}/uL (ref 200–950)
Basophils Absolute: 63 {cells}/uL (ref 0–200)
Basophils Relative: 1 %
Eosinophils Absolute: 202 {cells}/uL (ref 15–500)
Eosinophils Relative: 3.2 %
HCT: 41.3 % (ref 35.0–45.0)
Hemoglobin: 13.5 g/dL (ref 11.7–15.5)
MCH: 28.8 pg (ref 27.0–33.0)
MCHC: 32.7 g/dL (ref 32.0–36.0)
MCV: 88.2 fL (ref 80.0–100.0)
MPV: 10.2 fL (ref 7.5–12.5)
Monocytes Relative: 7.3 %
Neutro Abs: 3572 {cells}/uL (ref 1500–7800)
Neutrophils Relative %: 56.7 %
Platelets: 295 10*3/uL (ref 140–400)
RBC: 4.68 10*6/uL (ref 3.80–5.10)
RDW: 12.9 % (ref 11.0–15.0)
Total Lymphocyte: 31.8 %
WBC: 6.3 10*3/uL (ref 3.8–10.8)

## 2023-04-26 LAB — COMPLETE METABOLIC PANEL WITH GFR
AG Ratio: 1.5 (calc) (ref 1.0–2.5)
ALT: 13 U/L (ref 6–29)
AST: 18 U/L (ref 10–35)
Albumin: 4.4 g/dL (ref 3.6–5.1)
Alkaline phosphatase (APISO): 115 U/L (ref 37–153)
BUN: 15 mg/dL (ref 7–25)
CO2: 27 mmol/L (ref 20–32)
Calcium: 9.4 mg/dL (ref 8.6–10.4)
Chloride: 106 mmol/L (ref 98–110)
Creat: 0.93 mg/dL (ref 0.50–1.03)
Globulin: 2.9 g/dL (ref 1.9–3.7)
Glucose, Bld: 94 mg/dL (ref 65–99)
Potassium: 4.1 mmol/L (ref 3.5–5.3)
Sodium: 144 mmol/L (ref 135–146)
Total Bilirubin: 0.3 mg/dL (ref 0.2–1.2)
Total Protein: 7.3 g/dL (ref 6.1–8.1)
eGFR: 71 mL/min/{1.73_m2} (ref >=60)

## 2023-04-29 ENCOUNTER — Other Ambulatory Visit: Payer: Self-pay | Admitting: Family Medicine

## 2023-04-29 DIAGNOSIS — E7800 Pure hypercholesterolemia, unspecified: Secondary | ICD-10-CM

## 2023-04-30 ENCOUNTER — Ambulatory Visit
Admission: RE | Admit: 2023-04-30 | Discharge: 2023-04-30 | Disposition: A | Discharge status: Home / Self Care | Payer: Self-pay | Source: Ambulatory Visit | Attending: Family Medicine | Admitting: Family Medicine

## 2023-04-30 DIAGNOSIS — E7800 Pure hypercholesterolemia, unspecified: Secondary | ICD-10-CM

## 2023-05-03 LAB — PAP, TP IMAGING W/ HPV RNA, RFLX HPV TYPE 16,18/45: HPV DNA High Risk: NOT DETECTED

## 2023-05-09 ENCOUNTER — Encounter: Payer: Self-pay | Admitting: *Deleted

## 2023-06-11 ENCOUNTER — Ambulatory Visit
Admission: RE | Admit: 2023-06-11 | Discharge: 2023-06-11 | Disposition: A | Discharge status: Home / Self Care | Payer: 59 | Source: Ambulatory Visit | Attending: Family Medicine | Admitting: Family Medicine

## 2023-06-11 DIAGNOSIS — Z1231 Encounter for screening mammogram for malignant neoplasm of breast: Secondary | ICD-10-CM | POA: Diagnosis not present

## 2023-06-24 ENCOUNTER — Ambulatory Visit (INDEPENDENT_AMBULATORY_CARE_PROVIDER_SITE_OTHER): Payer: 59

## 2023-06-24 DIAGNOSIS — Z23 Encounter for immunization: Secondary | ICD-10-CM | POA: Diagnosis not present

## 2023-06-24 NOTE — Progress Notes (Signed)
 Patient is in office today for a nurse visit for Immunization. Patient Injection was given in the  Right deltoid. Patient tolerated injection well.

## 2023-07-23 ENCOUNTER — Other Ambulatory Visit: Payer: Self-pay

## 2023-08-27 ENCOUNTER — Other Ambulatory Visit: Payer: Self-pay

## 2023-11-14 ENCOUNTER — Other Ambulatory Visit: Payer: Self-pay

## 2023-11-14 ENCOUNTER — Ambulatory Visit
Admission: EM | Admit: 2023-11-14 | Discharge: 2023-11-14 | Disposition: A | Discharge status: Home / Self Care | Attending: Nurse Practitioner | Admitting: Nurse Practitioner

## 2023-11-14 DIAGNOSIS — R197 Diarrhea, unspecified: Secondary | ICD-10-CM | POA: Diagnosis not present

## 2023-11-14 DIAGNOSIS — L259 Unspecified contact dermatitis, unspecified cause: Secondary | ICD-10-CM | POA: Diagnosis not present

## 2023-11-14 DIAGNOSIS — R21 Rash and other nonspecific skin eruption: Secondary | ICD-10-CM | POA: Diagnosis present

## 2023-11-14 MED ORDER — DEXAMETHASONE SODIUM PHOSPHATE 10 MG/ML IJ SOLN
10.0000 mg | Freq: Once | INTRAMUSCULAR | Status: AC
  Administered 2023-11-14: 10 mg via INTRAMUSCULAR

## 2023-11-14 MED ORDER — AZITHROMYCIN 500 MG PO TABS
500.0000 mg | ORAL_TABLET | Freq: Every day | ORAL | 0 refills | Status: AC
  Filled 2023-11-14: qty 3, 3d supply, fill #0

## 2023-11-14 MED ORDER — METHYLPREDNISOLONE 4 MG PO TBPK
ORAL_TABLET | ORAL | 0 refills | Status: AC
  Filled 2023-11-14: qty 21, 6d supply, fill #0

## 2023-11-14 MED ORDER — TRIAMCINOLONE ACETONIDE 0.5 % EX OINT
1.0000 | TOPICAL_OINTMENT | Freq: Two times a day (BID) | CUTANEOUS | 0 refills | Status: AC
  Filled 2023-11-14: qty 30, 15d supply, fill #0

## 2023-11-14 NOTE — Discharge Instructions (Addendum)
 You were seen today for a rash and diarrhea. The rash s consistent with allergic or irritant contact dermatitis, likely triggered by something you were exposed to during recent travel. You received a steroid injection in clinic and were prescribed a short course of oral steroids to reduce inflammation. A steroid ointment was also prescribed to be applied to affected areas twice a day. Continue taking Zyrtec daily and use Benadryl at night. Avoid further exposure to the suspected irritant and use gentle, fragrance-free cleansers and moisturizers to help soothe the skin.  You also reported loose, watery stools without fever, vomiting, or significant abdominal pain. This may be due to a viral infection, recent travel, gluten sensitivity, or underlying irritable bowel syndrome. A stool test (GI panel) was ordered to rule out infections and check for signs of inflammation. Azithromycin  was prescribed for three days as a precaution. Be sure to drink plenty of fluids such as water  or electrolyte drinks to stay hydrated. Follow a bland diet including foods like bananas, rice, applesauce, and toast until your stools begin to return to normal.  Follow up with your primary care provider to review test results or if your symptoms do not improve within a few days. Seek emergency care if you develop signs of dehydration, blood in your stool, high fever, severe abdominal pain, or if your rash spreads or becomes painful.

## 2023-11-14 NOTE — ED Triage Notes (Addendum)
 Patient to Urgent Care with complaints of itching rash present to bilateral arms/ bilateral ankles/ bilateral underarms/ neck.  Symptoms started 8 days ago. Recent travel [place]/ went [place] (wore a shirt with sun protection that she bought there). Hiking.   Using hydrocortisone / zyrtec daily/ washed all her clothing.

## 2023-11-14 NOTE — ED Provider Notes (Signed)
 Jennifer Castaneda    CSN: [id number] Arrival date & time: 11/14/23  1541      History   Chief Complaint Chief Complaint  Patient presents with   Rash    HPI Jennifer Castaneda is a 58 y.o. female.   Discussed the use of AI scribe software for clinical note transcription with the patient, who gave verbal consent to proceed.   Patient presents with two chief complaints: a rash and diarrhea. The patient developed a rash one week ago following a trip to [facility] where she engaged in hiking and [address] rafting activities. The rash is located on her ankles, neck, and under her arms. It is described as itchy and resembles an allergic or irritant contact dermatitis. The patient suspects it may be related to a new sunscreen-protective shirt she purchased and wore without washing first. She has been self-treating with Zyrtec and hydrocortisone  cream, reporting variable effectiveness.  Concurrently, the patient is experiencing diarrhea, which she describes as very loose liquid. She denies any recent dietary changes, fevers, chills, body aches, nausea, or vomiting. Her appetite remains good. The patient reports a history of gluten sensitivity and mentions that she sometimes experiences such severe diarrhea that she struggles to reach the toilet. She has previously been evaluated for irritable bowel syndrome (IBS) and underwent a scope, which did not reveal IBS. The patient mentions occasionally seeing plain mucus in her stool. A previous healthcare provider attributed her symptoms to stress and prescribed a month's supply of amitriptyline .  The patient notes that her allergies seem to be worsening with age. She also mentions having a torn up gut, although the specific meaning of this statement is unclear.  The following portions of the patient's history were reviewed and updated as appropriate: allergies, current medications, past family history, past medical history,  past social history, past surgical history, and problem list.    Past Medical History:  Diagnosis Date   Allergy    Complication of anesthesia    Pt is Native American   History of COVID-19 03/17/2019   retested 06/29/2019 positive result   History of kidney stones    History of nephrolithiasis    Hyperparathyroidism (HCC)    parathyroidectomy planned for 05/2017 at [facility]   Hypertension    Migraine headache    PONV (postoperative nausea and vomiting)    Wears glasses     Patient Active Problem List   Diagnosis Date Noted   Pain of left hip joint 01/17/2022   Kidney stones 12/30/2017   Encounter for screening colonoscopy    Hyperparathyroidism (HCC)    Hypertension    Migraines    History of nephrolithiasis    Allergy     Past Surgical History:  Procedure Laterality Date   AIKEN OSTEOTOMY Right 11/30/2021   Procedure: first metatarsal and hallux proximal phalanx osteotomies;  Surgeon: Kit Rush, MD;  Location: [facility];  Service: Orthopedics;  Laterality: Right;   BREAST SURGERY     cyst   BUNIONECTOMY Right 11/30/2021   Procedure: BUNION CORRECTION;  Surgeon: Kit Rush, MD;  Location: [facility];  Service: Orthopedics;  Laterality: Right;  regional block   CESAREAN SECTION     COLONOSCOPY WITH PROPOFOL  N/A 11/26/2017   Procedure: COLONOSCOPY WITH PROPOFOL ;  Surgeon: Unk Corinn Skiff, MD;  Location: [facility];  Service: Gastroenterology;  Laterality: N/A;   CYSTOSCOPY/URETEROSCOPY/HOLMIUM LASER/STENT PLACEMENT Right 07/10/2019   Procedure: CYSTOSCOPY/RETROGRADE/URETEROSCOPY/HOLMIUM LASER/ BASKET STONE EXTRACTION/STENT PLACEMENT;  Surgeon: Nieves Cough,  MD;  Location: [facility];  Service: Urology;  Laterality: Right;   left superior parathyroidectomy     2019 at [facility] Dr. Jerilynn Cramp   NASAL SINUS SURGERY     PARATHYROIDECTOMY      OB History   No obstetric history on file.      Home Medications     Prior to Admission medications   Medication Sig Start Date End Date Taking? Authorizing Provider  azithromycin  (ZITHROMAX ) 500 MG tablet Take 1 tablet (500 mg total) by mouth daily for 3 days. 11/14/23 11/17/23 Yes Iola Lukes, FNP  methylPREDNISolone  (MEDROL  DOSEPAK) 4 MG TBPK tablet Take as directed 11/14/23  Yes Iola Lukes, FNP  triamcinolone  ointment (KENALOG ) 0.5 % Apply 1 Application topically 2 (two) times daily. Apply thin layer to affected areas twice a day for up to 2 weeks 11/14/23  Yes Ronn Smolinsky, Lukes, FNP  acetaminophen  (TYLENOL ) 500 MG tablet Take 500-1,000 mg by mouth every 6 (six) hours as needed for moderate pain or headache.     [provider]  amLODipine  (NORVASC ) 10 MG tablet Take 1 tablet (10 mg total) by mouth daily. 04/25/23   Duanne Butler DASEN, MD  APPLE CIDER VINEGAR PO Take 1 capsule by mouth daily.    [provider]  doxycycline  (VIBRAMYCIN ) 100 MG capsule Take 1 capsule (100 mg total) by mouth 2 (two) times daily. Patient not taking: Reported on 04/25/2023 03/24/23   Teresa Shelba SAUNDERS, NP  escitalopram  (LEXAPRO ) 10 MG tablet Take 1 tablet (10 mg total) by mouth daily. 04/25/23   Duanne Butler DASEN, MD  Melatonin 3 MG CAPS Take 3-6 mg by mouth at bedtime as needed (sleep).    [provider]  Multiple Vitamin (MULTIVITAMIN) tablet Take 2 tablets by mouth daily.     [provider]  OVER THE COUNTER MEDICATION Take 1-2 tablets by mouth daily. hydroxycut    [provider]    Family History Family History  Problem Relation Age of Onset   Heart disease Mother    Hypertension Mother    Cancer Neg Hx    Breast cancer Neg Hx     Social History Social History   Tobacco Use   Smoking status: Former    Current packs/day: 0.00    Types: Cigarettes    Quit date: 08/14/2004    Years since quitting: 19.2   Smokeless tobacco: Never  Vaping Use   Vaping status: Never Used  Substance Use Topics   Alcohol use: No    Drug use: No     Allergies   Aspartame and phenylalanine, Other, Phenylalanine, Asa [aspirin], Gluten meal, Lactose intolerance (gi), Penicillins, and Skelaxin  [metaxalone ]   Review of Systems Review of Systems  Constitutional:  Negative for appetite change, chills, diaphoresis, fatigue and fever.  HENT: Negative.    Gastrointestinal:  Positive for diarrhea. Negative for abdominal pain, nausea and vomiting.  Skin:  Positive for rash.  All other systems reviewed and are negative.    Physical Exam Triage Vital Signs ED Triage Vitals  Encounter Vitals Group     BP 11/14/23 1654 120/67     Girls Systolic BP Percentile --      Girls Diastolic BP Percentile --      Boys Systolic BP Percentile --      Boys Diastolic BP Percentile --      Pulse Rate 11/14/23 1654 62     Resp 11/14/23 1654 18     Temp 11/14/23 1654 97.8  F (36.6 C)     Temp src --      SpO2 11/14/23 1654 99 %     Weight --      Height --      Head Circumference --      Peak Flow --      Pain Score 11/14/23 1656 0     Pain Loc --      Pain Education --      Exclude from Growth Chart --    No data found.  Updated Vital Signs BP 120/67   Pulse 62   Temp 97.8 F (36.6 C)   Resp 18   LMP 04/16/2012   SpO2 99%   Visual Acuity Right Eye Distance:   Left Eye Distance:   Bilateral Distance:    Right Eye Near:   Left Eye Near:    Bilateral Near:     Physical Exam Vitals reviewed.  Constitutional:      General: She is awake. She is not in acute distress.    Appearance: Normal appearance. She is well-developed. She is not ill-appearing, toxic-appearing or diaphoretic.  HENT:     Head: Normocephalic.     Right Ear: Hearing normal.     Left Ear: Hearing normal.     Nose: Nose normal.     Mouth/Throat:     Mouth: Mucous membranes are moist.  Eyes:     General: Vision grossly intact.     Conjunctiva/sclera: Conjunctivae normal.  Cardiovascular:     Rate and Rhythm: Normal rate and regular rhythm.      Heart sounds: Normal heart sounds.  Pulmonary:     Effort: Pulmonary effort is normal.     Breath sounds: Normal breath sounds and air entry.  Abdominal:     General: Bowel sounds are normal. There is no distension.     Palpations: Abdomen is soft.     Tenderness: There is no abdominal tenderness.  Musculoskeletal:        General: Normal range of motion.     Cervical back: Full passive range of motion without pain, normal range of motion and neck supple.  Skin:    General: Skin is warm and dry.     Findings: Rash present.     Comments: Pustular clustered erythematous rash noted diffusely   Neurological:     General: No focal deficit present.     Mental Status: She is alert and oriented to person, place, and time.  Psychiatric:        Speech: Speech normal.        Behavior: Behavior is cooperative.      UC Treatments / Results  Labs (all labs ordered are listed, but only abnormal results are displayed) Labs Reviewed  GASTROINTESTINAL PANEL BY PCR, STOOL (REPLACES STOOL CULTURE)    EKG   Radiology No results found.  Procedures Procedures (including critical care time)  Medications Ordered in UC Medications  dexamethasone  (DECADRON ) injection 10 mg (has no administration in time range)    Initial Impression / Assessment and Plan / UC Course  I have reviewed the triage vital signs and the nursing notes.  Pertinent labs & imaging results that were available during my care of the patient were reviewed by me and considered in my medical decision making (see chart for details).     Patient presents with a one-week history of a rash affecting the ankles, neck, and underarms, along with recent onset of loose, watery diarrhea. The rash began  after wearing a new sun-protective shirt during travel to [facility] and is consistent with allergic or irritant contact dermatitis. The patient has been using Zyrtec and hydrocortisone  cream with minimal improvement. Due  to persistent symptoms, a steroid injection was administered and a short course of oral steroids prescribed to reduce the histamine response. Zyrtec will be continued daily, with Benadryl recommended at night for additional symptom control. The diarrhea is not associated with fever, vomiting, or abdominal pain and is suspected to be viral in nature, although recent travel, known gluten sensitivity, and prior discussions of irritable bowel syndrome warrant further evaluation. A GI panel has been ordered to rule out infectious causes and assess for mucus, which could suggest IBS. Azithromycin  was prescribed empirically for possible bacterial etiology. Supportive care includes increased fluid intake and adherence to a bland diet. Patient advised to follow up with their primary care provider for GI panel results or if symptoms persist. Return to the emergency department for signs of dehydration, blood in stool, high fever, worsening rash, or severe abdominal pain.  Today's evaluation has revealed no signs of a dangerous process. Discussed diagnosis with patient and/or guardian. Patient and/or guardian aware of their diagnosis, possible red flag symptoms to watch out for and need for close follow up. Patient and/or guardian understands verbal and written discharge instructions. Patient and/or guardian comfortable with plan and disposition.  Patient and/or guardian has a clear mental status at this time, good insight into illness (after discussion and teaching) and has clear judgment to make decisions regarding their care  Documentation was completed with the aid of voice recognition software. Transcription may contain typographical errors. Final Clinical Impressions(s) / UC Diagnoses   Final diagnoses:  Contact dermatitis, unspecified contact dermatitis type, unspecified trigger  Diarrhea of presumed infectious origin     Discharge Instructions      You were seen today for a rash and diarrhea. The rash s  consistent with allergic or irritant contact dermatitis, likely triggered by something you were exposed to during recent travel. You received a steroid injection in clinic and were prescribed a short course of oral steroids to reduce inflammation. A steroid ointment was also prescribed to be applied to affected areas twice a day. Continue taking Zyrtec daily and use Benadryl at night. Avoid further exposure to the suspected irritant and use gentle, fragrance-free cleansers and moisturizers to help soothe the skin.  You also reported loose, watery stools without fever, vomiting, or significant abdominal pain. This may be due to a viral infection, recent travel, gluten sensitivity, or underlying irritable bowel syndrome. A stool test (GI panel) was ordered to rule out infections and check for signs of inflammation. Azithromycin  was prescribed for three days as a precaution. Be sure to drink plenty of fluids such as water  or electrolyte drinks to stay hydrated. Follow a bland diet including foods like bananas, rice, applesauce, and toast until your stools begin to return to normal.  Follow up with your primary care provider to review test results or if your symptoms do not improve within a few days. Seek emergency care if you develop signs of dehydration, blood in your stool, high fever, severe abdominal pain, or if your rash spreads or becomes painful.      ED Prescriptions     Medication Sig Dispense Auth. Provider   methylPREDNISolone  (MEDROL  DOSEPAK) 4 MG TBPK tablet Take as directed 21 tablet Iola Lukes, FNP   triamcinolone  ointment (KENALOG ) 0.5 % Apply 1 Application topically 2 (two)  times daily. Apply thin layer to affected areas twice a day for up to 2 weeks 30 g Iola Lukes, FNP   azithromycin  (ZITHROMAX ) 500 MG tablet Take 1 tablet (500 mg total) by mouth daily for 3 days. 3 tablet Iola Lukes, FNP      PDMP not reviewed this encounter.   Iola Lukes,  [place] 11/14/23 [phone]

## 2023-11-16 ENCOUNTER — Telehealth: Payer: Self-pay | Admitting: Emergency Medicine

## 2023-11-16 DIAGNOSIS — R197 Diarrhea, unspecified: Secondary | ICD-10-CM

## 2023-11-16 NOTE — Telephone Encounter (Signed)
 Seen on 731. Returned stool sample for GI panel

## 2023-11-17 LAB — GASTROINTESTINAL PANEL BY PCR, STOOL (REPLACES STOOL CULTURE)

## 2024-02-24 ENCOUNTER — Other Ambulatory Visit: Payer: Self-pay

## 2024-02-24 MED ORDER — COMIRNATY 30 MCG/0.3ML IM SUSY
0.3000 mL | PREFILLED_SYRINGE | Freq: Once | INTRAMUSCULAR | 0 refills | Status: AC
  Filled 2024-02-24: qty 0.3, 1d supply, fill #0

## 2024-02-24 MED ORDER — FLUZONE 0.5 ML IM SUSY
0.5000 mL | PREFILLED_SYRINGE | Freq: Once | INTRAMUSCULAR | 0 refills | Status: AC
  Filled 2024-02-24: qty 0.5, 1d supply, fill #0

## 2024-03-10 ENCOUNTER — Other Ambulatory Visit: Payer: Self-pay

## 2024-03-24 ENCOUNTER — Other Ambulatory Visit: Payer: Self-pay

## 2024-04-14 ENCOUNTER — Other Ambulatory Visit: Payer: Self-pay

## 2024-04-14 ENCOUNTER — Other Ambulatory Visit: Payer: Self-pay | Admitting: Family Medicine

## 2024-04-14 DIAGNOSIS — I10 Essential (primary) hypertension: Secondary | ICD-10-CM

## 2024-04-15 ENCOUNTER — Other Ambulatory Visit: Payer: Self-pay

## 2024-06-24 ENCOUNTER — Other Ambulatory Visit: Payer: Self-pay

## 2024-06-24 ENCOUNTER — Encounter: Payer: Self-pay | Admitting: Family Medicine

## 2024-06-24 ENCOUNTER — Ambulatory Visit: Admitting: Family Medicine

## 2024-06-24 DIAGNOSIS — I10 Essential (primary) hypertension: Secondary | ICD-10-CM | POA: Diagnosis not present

## 2024-06-24 MED ORDER — AMLODIPINE BESYLATE 10 MG PO TABS
10.0000 mg | ORAL_TABLET | Freq: Every day | ORAL | 1 refills | Status: AC
  Filled 2024-06-24: qty 30, 30d supply, fill #0
  Filled 2024-07-25: qty 30, 30d supply, fill #1

## 2024-06-25 ENCOUNTER — Other Ambulatory Visit: Payer: Self-pay

## 2024-07-08 ENCOUNTER — Other Ambulatory Visit: Payer: Self-pay | Admitting: Family Medicine

## 2024-07-08 ENCOUNTER — Other Ambulatory Visit: Payer: Self-pay

## 2024-07-09 ENCOUNTER — Other Ambulatory Visit: Payer: Self-pay

## 2024-07-09 MED ORDER — ESCITALOPRAM OXALATE 10 MG PO TABS
10.0000 mg | ORAL_TABLET | Freq: Every day | ORAL | 5 refills | Status: AC
  Filled 2024-07-09: qty 30, 30d supply, fill #0

## 2024-07-13 ENCOUNTER — Other Ambulatory Visit: Payer: Self-pay

## 2024-07-25 DIAGNOSIS — I10 Essential (primary) hypertension: Secondary | ICD-10-CM

## 2024-07-27 ENCOUNTER — Other Ambulatory Visit: Payer: Self-pay

## 2024-08-20 ENCOUNTER — Encounter: Admitting: Family Medicine

## 2024-10-12 ENCOUNTER — Ambulatory Visit: Admitting: Family Medicine
